# Patient Record
Sex: Female | Born: 1980 | Race: White | Hispanic: No | Smoking: Former smoker
Health system: Southern US, Community
[De-identification: ages and names within clinical notes are randomized; demographics above are authoritative.]

## PROBLEM LIST (undated history)

## (undated) DIAGNOSIS — F32A Depression, unspecified: Secondary | ICD-10-CM

## (undated) DIAGNOSIS — F329 Major depressive disorder, single episode, unspecified: Secondary | ICD-10-CM

## (undated) DIAGNOSIS — F419 Anxiety disorder, unspecified: Secondary | ICD-10-CM

## (undated) DIAGNOSIS — T7840XA Allergy, unspecified, initial encounter: Secondary | ICD-10-CM

## (undated) DIAGNOSIS — A64 Unspecified sexually transmitted disease: Secondary | ICD-10-CM

## (undated) HISTORY — DX: Allergy, unspecified, initial encounter: T78.40XA

## (undated) HISTORY — DX: Depression, unspecified: F32.A

## (undated) HISTORY — DX: Major depressive disorder, single episode, unspecified: F32.9

## (undated) HISTORY — PX: NO PAST SURGERIES: SHX2092

## (undated) HISTORY — DX: Anxiety disorder, unspecified: F41.9

## (undated) HISTORY — PX: INTRAUTERINE DEVICE (IUD) INSERTION: SHX5877

## (undated) HISTORY — DX: Unspecified sexually transmitted disease: A64

---

## 2008-12-19 ENCOUNTER — Encounter: Payer: Self-pay | Admitting: Internal Medicine

## 2008-12-19 ENCOUNTER — Encounter: Admission: RE | Admit: 2008-12-19 | Discharge: 2008-12-19 | Payer: Self-pay | Admitting: Internal Medicine

## 2010-11-02 ENCOUNTER — Encounter: Payer: Self-pay | Admitting: Internal Medicine

## 2010-11-02 ENCOUNTER — Other Ambulatory Visit: Payer: Self-pay | Admitting: Internal Medicine

## 2010-11-02 ENCOUNTER — Other Ambulatory Visit: Payer: BC Managed Care – PPO

## 2010-11-02 ENCOUNTER — Ambulatory Visit (INDEPENDENT_AMBULATORY_CARE_PROVIDER_SITE_OTHER): Payer: BC Managed Care – PPO | Admitting: Internal Medicine

## 2010-11-02 DIAGNOSIS — Z0000 Encounter for general adult medical examination without abnormal findings: Secondary | ICD-10-CM

## 2010-11-02 DIAGNOSIS — F32A Depression, unspecified: Secondary | ICD-10-CM | POA: Insufficient documentation

## 2010-11-02 DIAGNOSIS — J309 Allergic rhinitis, unspecified: Secondary | ICD-10-CM | POA: Insufficient documentation

## 2010-11-02 DIAGNOSIS — F411 Generalized anxiety disorder: Secondary | ICD-10-CM | POA: Insufficient documentation

## 2010-11-02 DIAGNOSIS — K219 Gastro-esophageal reflux disease without esophagitis: Secondary | ICD-10-CM | POA: Insufficient documentation

## 2010-11-02 DIAGNOSIS — F329 Major depressive disorder, single episode, unspecified: Secondary | ICD-10-CM | POA: Insufficient documentation

## 2010-11-02 LAB — CBC WITH DIFFERENTIAL/PLATELET
Basophils Absolute: 0 10*3/uL (ref 0.0–0.1)
Eosinophils Relative: 0.8 % (ref 0.0–5.0)
HCT: 34.1 % — ABNORMAL LOW (ref 36.0–46.0)
Lymphs Abs: 2.5 10*3/uL (ref 0.7–4.0)
MCV: 87.2 fl (ref 78.0–100.0)
Monocytes Absolute: 0.5 10*3/uL (ref 0.1–1.0)
Neutrophils Relative %: 63 % (ref 43.0–77.0)
Platelets: 302 10*3/uL (ref 150.0–400.0)
RDW: 12.7 % (ref 11.5–14.6)
WBC: 8.5 10*3/uL (ref 4.5–10.5)

## 2010-11-02 LAB — HEPATIC FUNCTION PANEL
ALT: 22 U/L (ref 0–35)
Bilirubin, Direct: 0.1 mg/dL (ref 0.0–0.3)
Total Bilirubin: 0.3 mg/dL (ref 0.3–1.2)

## 2010-11-02 LAB — LIPID PANEL
Cholesterol: 185 mg/dL (ref 0–200)
LDL Cholesterol: 111 mg/dL — ABNORMAL HIGH (ref 0–99)
Triglycerides: 138 mg/dL (ref 0.0–149.0)
VLDL: 27.6 mg/dL (ref 0.0–40.0)

## 2010-11-02 LAB — TSH: TSH: 1.88 u[IU]/mL (ref 0.35–5.50)

## 2010-11-02 LAB — BASIC METABOLIC PANEL
BUN: 14 mg/dL (ref 6–23)
Creatinine, Ser: 0.9 mg/dL (ref 0.4–1.2)
GFR: 77.31 mL/min (ref >60.00)
Glucose, Bld: 76 mg/dL (ref 70–99)

## 2010-11-02 LAB — URINALYSIS, ROUTINE W REFLEX MICROSCOPIC
Nitrite: NEGATIVE
Specific Gravity, Urine: 1.02 (ref 1.000–1.030)
Total Protein, Urine: NEGATIVE
pH: 8 (ref 5.0–8.0)

## 2010-11-11 ENCOUNTER — Telehealth: Payer: Self-pay | Admitting: Internal Medicine

## 2010-11-11 NOTE — Assessment & Plan Note (Signed)
Summary: NEW [employer] PT--PKG--#-- STC   Vital Signs:  Patient profile:   30 year old female Height:      68 inches (172.72 cm) Weight:      178.12 pounds (80.96 kg) BMI:     27.18 O2 Sat:      97 % on Room air Temp:     98.1 degrees F (36.72 degrees C) oral Pulse rate:   55 / minute BP sitting:   102 / 62  (left arm) Cuff size:   regular  Vitals Entered By: Orlan Leavens RMA (November 02, 2010 3:03 PM)  O2 Flow:  Room air CC: New patient Is Patient Diabetic? No Pain Assessment Patient in pain? no        Primary Care Provider:  Newt Lukes MD  CC:  New patient.  History of Present Illness: new pt to me and our practice, here to est care  patient is here today for annual physical. Patient feels well and has no complaints.   reviewed chronic med issues -  long hx depression - on meds for same since 30yo (always prozac) inc anxiety symptoms rather than depression - started xanax for same with improvment - also "nervous stomach" - nausea and ache when stressed - no weight changes, no bowel changes would like to resume counseling and ?try alt med tx  considering pregnancy in coming year  Preventive Screening-Counseling & Management  Alcohol-Tobacco     Alcohol drinks/day: <1     Alcohol Counseling: not indicated; use of alcohol is not excessive or problematic     Smoking Status: quit     Tobacco Counseling: to remain off tobacco products  Caffeine-Diet-Exercise     Does Patient Exercise: yes     Type of exercise: dance, yoga, pilates     Exercise Counseling: not indicated; exercise is adequate     Depression Counseling: further diagnostic testing and/or other treatment is indicated  Safety-Violence-Falls     Seat Belt Counseling: not indicated; patient wears seat belts     Firearm Counseling: not indicated; uses recommended firearm safety measures     Violence Counseling: not indicated; no violence risk noted     Fall Risk Counseling: not indicated; no  significant falls noted  Current Medications (verified): 1)  Prozac 20 Mg Caps (Fluoxetine Hcl) .... Take 1 By Mouth Once Daily 2)  Alprazolam 0.25 Mg Tabs (Alprazolam) .... Take 1 By Mouth Once Daily As Needed 3)  Tri-Sprintec 0.18/0.215/0.25 Mg-35 Mcg Tabs (Norgestim-Eth Estrad Triphasic) .... 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 I have reviewed all other systems and they were negative.   Physical Exam  General:  alert, well-developed, well-nourished, and cooperative to examination.    Head:  Normocephalic and atraumatic without obvious abnormalities. No apparent alopecia or balding. Eyes:  vision grossly intact; pupils equal, round and reactive to light.  conjunctiva and lids normal.  Ears:  normal pinnae bilaterally, without erythema, swelling, or tenderness to palpation. TMs clear, without effusion, or cerumen impaction. Hearing grossly normal bilaterally  Mouth:  teeth and gums in good repair; mucous membranes moist, without lesions or ulcers. oropharynx clear without exudate, no erythema.  Neck:  supple, full ROM, no masses, no thyromegaly; no thyroid nodules or tenderness. no JVD or carotid bruits.   Lungs:  normal respiratory effort, no intercostal retractions or use of accessory muscles; normal breath sounds bilaterally - no crackles and no  wheezes.    Heart:  normal rate, regular rhythm, no murmur, and no rub. BLE without edema.  Abdomen:  soft, non-tender, normal bowel sounds, no distention; no masses and no appreciable hepatomegaly or splenomegaly.   Genitalia:  defer Msk:  No deformity or scoliosis noted of thoracic or lumbar spine.   Neurologic:  alert & oriented X3 and cranial nerves II-XII symetrically intact.  strength normal in all extremities, sensation intact to light touch, and gait normal. speech fluent without dysarthria or aphasia; follows commands with good comprehension.  Skin:  no rashes, vesicles, ulcers, or erythema. No nodules or irregularity to palpation.  Psych:  Oriented X3, memory intact for recent and remote, normally interactive, good eye contact, not anxious appearing, min depressed appearing, and not agitated.      Impression & Recommendations:  Problem # 1:  PREVENTIVE HEALTH CARE (ICD-V70.0) Patient has been counseled on age-appropriate routine health concerns for screening and prevention. These are reviewed and up-to-date. Immunizations are up-to-date or declined. Labs ordered and to be reviewed.  Orders: TLB-Lipid Panel (80061-LIPID) TLB-BMP (Basic Metabolic Panel-BMET) (80048-METABOL) TLB-CBC Platelet - w/Differential (85025-CBCD) TLB-Hepatic/Liver Function Pnl (80076-HEPATIC) TLB-TSH (Thyroid Stimulating Hormone) (84443-TSH) TLB-Udip w/ Micro (81001-URINE) Gynecologic Referral (Gyn)  Problem # 2:  ANXIETY STATE, UNSPECIFIED (ICD-300.00) long hx depression - now inc anxiety symptoms  change prozac to sertraline and ok to use as needed xanax as ongoing -  anticipated risk.benefits of med change reviewed and pt understands/agrees rec counseling - pt agrees and will call back with pref providers on same f/u 6-8 weeks on symptoms and meds, sooner if problems Her updated medication list for this problem includes:    Sertraline Hcl 50 Mg Tabs (Sertraline hcl) .Marland Kitchen... 1 by mouth once daily     Alprazolam 0.25 Mg Tabs (Alprazolam) .Marland Kitchen... Take 1 by mouth once daily as needed  Complete Medication List: 1)  Sertraline Hcl 50 Mg Tabs (Sertraline hcl) .Marland Kitchen.. 1 by mouth once daily 2)  Alprazolam 0.25 Mg Tabs (Alprazolam) .... Take 1 by mouth once daily as needed 3)  Tri-sprintec 0.18/0.215/0.25 Mg-35 Mcg Tabs (Norgestim-eth estrad triphasic) .... Take 1 by mouth once daily 4)  Prilosec Otc 20 Mg Tbec (Omeprazole magnesium) .... As needed  Patient Instructions: 1)  it was good to see you today. 2)  medical history reviewed today - exam looks good 3)  test(s) ordered today - your results will be posted on the phone tree for review in 48-72 hours from the time of test completion; call [phone] and enter your 9 digit MRN (listed above on this page, just below your name); if any changes need to be made or there are abnormal results, you will be contacted directly. 4)  stop prozac and start generic zoloft (sertraline) for anxiety symptoms - ok to continue xanax as needed  - your prescription has been electronically submitted to your pharmacy. Please take as directed. Contact our office if you believe you're having problems with the medication(s).  5)  let us know who you would like to see for counseling to help with anxiety management amd we will make referral 6)  we'll make referral to gynecology. Our office will contact you regarding this appointment once made.  7)  Please schedule a follow-up appointment in 6-8 weeks to review anxiety and medications, call sooner if problems.  Prescriptions: SERTRALINE HCL 50 MG TABS (SERTRALINE HCL) 1 by mouth once daily  #30 x 3   Entered and Authorized by:   Newt Lukes MD   Signed by:   Newt Lukes MD on 11/02/2010   Method used:   Electronically to        [facility]       [address]       [place], [place]  [address]       Ph: [phone]       Fax: [phone]   RxID:    [id number]    Orders Added: 1)  TLB-Lipid Panel [80061-LIPID] 2)  TLB-BMP (Basic Metabolic Panel-BMET) [80048-METABOL] 3)  TLB-CBC Platelet - w/Differential [85025-CBCD] 4)  TLB-Hepatic/Liver Function Pnl [80076-HEPATIC] 5)  TLB-TSH (Thyroid Stimulating Hormone) [84443-TSH] 6)  TLB-Udip w/ Micro [81001-URINE] 7)  Gynecologic Referral [Gyn] 8)  New Patient 18-39 years [99385]   Immunization History:  Tetanus/Td Immunization History:    Tetanus/Td:  historical (09/26/2005)   Immunization History:  Tetanus/Td Immunization History:    Tetanus/Td:  Historical (09/26/2005)

## 2010-11-17 NOTE — Progress Notes (Signed)
Summary: REQ FOR REFERRAL  Phone Note Call from Patient   Summary of Call: Patient is requesting referral to psych MD - Dr Perrin Maltese @ [phone] Initial call taken by: Lamar Sprinkles, CMA,  November 11, 2010 5:33 PM  Follow-up for Phone Call        order as requested - done Follow-up by: Newt Lukes MD,  November 12, 2010 8:17 AM  Additional Follow-up for Phone Call Additional follow up Details #1::        pt advised and will expect a call from [facility] with appt info Additional Follow-up by: Margaret Pyle, CMA,  November 12, 2010 8:44 AM

## 2010-12-15 ENCOUNTER — Encounter: Payer: Self-pay | Admitting: Internal Medicine

## 2010-12-15 ENCOUNTER — Ambulatory Visit (INDEPENDENT_AMBULATORY_CARE_PROVIDER_SITE_OTHER): Payer: BC Managed Care – PPO | Admitting: Internal Medicine

## 2010-12-15 VITALS — BP 124/78 | HR 42 | Temp 97.9°F | Ht 68.0 in | Wt 178.4 lb

## 2010-12-15 DIAGNOSIS — K219 Gastro-esophageal reflux disease without esophagitis: Secondary | ICD-10-CM

## 2010-12-15 DIAGNOSIS — F411 Generalized anxiety disorder: Secondary | ICD-10-CM

## 2010-12-15 MED ORDER — SERTRALINE HCL 100 MG PO TABS: 100.0000 mg | ORAL_TABLET | Freq: Every day | ORAL | Status: DC

## 2010-12-15 NOTE — Assessment & Plan Note (Signed)
Started sertraline 09/2010 - improved but not at goal - titrate now - erx done Cont counseling as ongoing

## 2010-12-15 NOTE — Assessment & Plan Note (Signed)
Improved with otc ppi x 30d - ok to cont same or use prn - diet control also discussed

## 2010-12-15 NOTE — Patient Instructions (Signed)
Good to see you today. Increase dose sertraline today as discussed - Your prescription(s) have been submitted to your pharmacy. Please take as directed and contact our office if you believe you are having problem(s) with the medication(s). Continue counseling as ongoing See you in 3-6 months to review symptoms and medications

## 2010-12-15 NOTE — Progress Notes (Signed)
Subjective:     Erica [name] is a 30 y.o. female who presents for follow up of anxiety disorder. Current symptoms: none. She denies current suicidal and homicidal ideation. She complains of the following side effects from the treatment: none.  The following portions of the patient's history were reviewed and updated as appropriate: allergies, current medications, past medical history and past social history.    ROS: no chest pain, no headache Objective:    BP 124/78  Pulse 42  Temp(Src) 97.9 F (36.6 C) (Oral)  Ht 5\' 8"  (1.727 m)  Wt 178 lb 6.4 oz (80.922 kg)  BMI 27.13 kg/m2  General:  alert, cooperative, appears stated age and no distress  Affect/Behavior:  good insight, normal reasoning and normal speech pattern and content  - no anxiety symptoms evident      Assessment:    Anxiety Disorder - improving    Plan:  See problem list   Medications: Zoloft. Reviewed concept of anxiety as biochemical imbalance of neurotransmitters and rationale for treatment. Spent 25 minutes (>50% of visit) discussing the risks of anxiety disorder, the  pathophysiology, etiology, risks, and principles of treatment.

## 2011-01-31 ENCOUNTER — Other Ambulatory Visit: Payer: Self-pay | Admitting: *Deleted

## 2011-01-31 MED ORDER — ALPRAZOLAM 0.25 MG PO TABS: 0.2500 mg | ORAL_TABLET | Freq: Every day | ORAL | Status: DC | PRN

## 2011-01-31 NOTE — Telephone Encounter (Signed)
Faxed script [facility] to [facility] 01/31/11@4 :33pm/LMB

## 2011-03-15 ENCOUNTER — Ambulatory Visit: Payer: BC Managed Care – PPO | Admitting: Internal Medicine

## 2011-03-25 ENCOUNTER — Ambulatory Visit (INDEPENDENT_AMBULATORY_CARE_PROVIDER_SITE_OTHER): Payer: BC Managed Care – PPO | Admitting: Internal Medicine

## 2011-03-25 ENCOUNTER — Encounter: Payer: Self-pay | Admitting: Internal Medicine

## 2011-03-25 DIAGNOSIS — F411 Generalized anxiety disorder: Secondary | ICD-10-CM

## 2011-03-25 DIAGNOSIS — K219 Gastro-esophageal reflux disease without esophagitis: Secondary | ICD-10-CM

## 2011-03-25 MED ORDER — ALPRAZOLAM 0.25 MG PO TABS: 0.2500 mg | ORAL_TABLET | Freq: Every day | ORAL | Status: DC | PRN

## 2011-03-25 NOTE — Assessment & Plan Note (Signed)
Improved with otc ppi ok to continue same  Probitoics, "beano" and diet control also discussed

## 2011-03-25 NOTE — Patient Instructions (Signed)
Good to see you today. Continue current dose sertraline and use alprazolam as needed - also continue counseling as ongoing Medications reviewed, no changes at this time. Refill on medication(s) as discussed today. See you in 10/2011 for physical and labs - will also review symptoms and medications

## 2011-03-25 NOTE — Progress Notes (Signed)
Subjective:     Erica [name] is a 30 y.o. female who presents for follow up of anxiety disorder. Current symptoms: none. She denies current suicidal and homicidal ideation. She complains of the following side effects from the treatment: none. SSRI dose increase last visit - improved; ongoing counseling - also with spouse  The following portions of the patient's history were reviewed and updated as appropriate: allergies, current medications, past medical history and past social history.    ROS: no chest pain, no headache Objective:    BP 128/84  Pulse 46  Temp(Src) 98.1 F (36.7 C) (Oral)  Ht 5\' 8"  (1.727 m)  Wt 180 lb 6.4 oz (81.829 kg)  BMI 27.43 kg/m2  SpO2 96%  General:  alert, cooperative, appears stated age and no distress  Affect/Behavior:  good insight, normal reasoning and normal speech pattern and content  - no anxiety symptoms evident     Lab Results  Component Value Date   WBC 8.5 11/02/2010   HGB 11.6* 11/02/2010   HCT 34.1* 11/02/2010   PLT 302.0 11/02/2010   CHOL 185 11/02/2010   TRIG 138.0 11/02/2010   HDL 46.60 11/02/2010   ALT 22 11/02/2010   AST 29 11/02/2010   NA 139 11/02/2010   K 4.3 11/02/2010   CL 104 11/02/2010   CREATININE 0.9 11/02/2010   BUN 14 11/02/2010   CO2 28 11/02/2010   TSH 1.88 11/02/2010    Assessment:    Anxiety Disorder - improving    Plan:  See problem list   Medications: Zoloft. Reviewed concept of anxiety as biochemical imbalance of neurotransmitters and rationale for treatment. Spent 25 minutes (>50% of visit) discussing the risks of anxiety disorder, the  pathophysiology, etiology, risks, and principles of treatment.

## 2011-03-25 NOTE — Assessment & Plan Note (Signed)
Started sertraline 09/2010, dose increase 12/2010 - improved -  The current medical regimen is effective;  continue present plan and medications. Cont counseling as ongoing

## 2011-06-16 ENCOUNTER — Other Ambulatory Visit: Payer: Self-pay | Admitting: *Deleted

## 2011-06-16 DIAGNOSIS — F411 Generalized anxiety disorder: Secondary | ICD-10-CM

## 2011-06-16 MED ORDER — ALPRAZOLAM 0.25 MG PO TABS: 0.2500 mg | ORAL_TABLET | Freq: Every day | ORAL | Status: DC | PRN

## 2011-06-16 NOTE — Telephone Encounter (Signed)
Done hardcopy to dahlia/LIM B  

## 2011-06-16 NOTE — Telephone Encounter (Signed)
MD is out of office. Is this ok to refill? 

## 2011-06-17 NOTE — Telephone Encounter (Signed)
Rx faxed to pharmacy  

## 2011-07-18 ENCOUNTER — Other Ambulatory Visit: Payer: Self-pay | Admitting: Internal Medicine

## 2011-09-27 NOTE — L&D Delivery Note (Signed)
Delivery Note At 9:36 AM a viable female was delivered via Vaginal, Spontaneous Delivery (Presentation: Left Occiput Anterior).  APGAR: 8, 9; weight 7 lb 1.8 oz (3226 g). Water Birth. Baby skin to skin on mother 's chest just at water level to maintain baby's temperature. Cord cut by FOB after ceased pulsing. Mother assisted back to bed for placenta delivery.   Placenta status: Intact, Spontaneous.  Cord: 3 vessels with the following complications: None.  Cord pH: not completed. Uncomplicated vaginal  water birth.  Anesthesia: Local  Episiotomy: None Lacerations: 2nd degree;Perineal; Rt Labial minora tear. In/out catheter - urethra patent. Suture Repair: 3.0 vicryl, 4.0 vicryl Est. Blood Loss (mL): 300 Baby has breast feed s/p delivery, skin to skin maintained with both mother and baby.  Mom to postpartum.  Baby to nursery-stable.  Earl Gala, CNM 08/29/2012, 11:39 AM

## 2011-10-28 ENCOUNTER — Ambulatory Visit: Payer: BC Managed Care – PPO | Admitting: Internal Medicine

## 2011-10-31 ENCOUNTER — Encounter: Payer: Self-pay | Admitting: Internal Medicine

## 2011-10-31 ENCOUNTER — Other Ambulatory Visit (INDEPENDENT_AMBULATORY_CARE_PROVIDER_SITE_OTHER): Payer: BC Managed Care – PPO

## 2011-10-31 ENCOUNTER — Ambulatory Visit (INDEPENDENT_AMBULATORY_CARE_PROVIDER_SITE_OTHER): Payer: BC Managed Care – PPO | Admitting: Internal Medicine

## 2011-10-31 VITALS — BP 110/72 | HR 59 | Temp 99.0°F | Ht 68.0 in | Wt 185.4 lb

## 2011-10-31 DIAGNOSIS — Z0000 Encounter for general adult medical examination without abnormal findings: Secondary | ICD-10-CM

## 2011-10-31 LAB — CBC WITH DIFFERENTIAL/PLATELET
Basophils Absolute: 0 10*3/uL (ref 0.0–0.1)
Eosinophils Absolute: 0.1 10*3/uL (ref 0.0–0.7)
Hemoglobin: 12.6 g/dL (ref 12.0–15.0)
Lymphocytes Relative: 39.1 % (ref 12.0–46.0)
Lymphs Abs: 2.2 10*3/uL (ref 0.7–4.0)
MCHC: 33.6 g/dL (ref 30.0–36.0)
Neutro Abs: 3 10*3/uL (ref 1.4–7.7)
Platelets: 261 10*3/uL (ref 150.0–400.0)
RDW: 13.4 % (ref 11.5–14.6)

## 2011-10-31 LAB — LIPID PANEL
Total CHOL/HDL Ratio: 4
Triglycerides: 82 mg/dL (ref 0.0–149.0)

## 2011-10-31 LAB — BASIC METABOLIC PANEL
BUN: 15 mg/dL (ref 6–23)
CO2: 29 mEq/L (ref 19–32)
Calcium: 9 mg/dL (ref 8.4–10.5)
Glucose, Bld: 84 mg/dL (ref 70–99)
Sodium: 137 mEq/L (ref 135–145)

## 2011-10-31 LAB — URINALYSIS, ROUTINE W REFLEX MICROSCOPIC
Ketones, ur: NEGATIVE
Urine Glucose: NEGATIVE
Urobilinogen, UA: 0.2 (ref 0.0–1.0)

## 2011-10-31 LAB — HEPATIC FUNCTION PANEL
ALT: 22 U/L (ref 0–35)
AST: 24 U/L (ref 0–37)
Alkaline Phosphatase: 68 U/L (ref 39–117)
Bilirubin, Direct: 0.1 mg/dL (ref 0.0–0.3)
Total Bilirubin: 0.6 mg/dL (ref 0.3–1.2)

## 2011-10-31 LAB — LDL CHOLESTEROL, DIRECT: Direct LDL: 150.5 mg/dL

## 2011-10-31 NOTE — Patient Instructions (Signed)
It was good to see you today. Test(s) ordered today. Your results will be called to you after review (48-72hours after test completion). If any changes need to be made, you will be notified at that time. Medications reviewed, no changes at this time. Please schedule followup annually for physical and labs, call sooner if problems.

## 2011-10-31 NOTE — Progress Notes (Signed)
Subjective:    Patient ID: Erica [name], female    DOB: [birth year], 31 y.o.   MRN: [id number]  HPI patient is here today for annual physical. Patient feels well and has no complaints.  Planning pregnancy this year. Off birth control since fall 2012.  Past Medical History  Diagnosis Date  . Anxiety   . Depression   . Allergy    Family History  Problem Relation Age of Onset  . Alcohol abuse Mother   . Alcohol abuse Other     Whole family  . Breast cancer Other   . Lung cancer Other    History  Substance Use Topics  . Smoking status: Former Games developer  . Smokeless tobacco: Not on file  . Alcohol Use: Yes     Social    Review of Systems Constitutional: Negative for fever or weight change.  Respiratory: Negative for cough and shortness of breath.   Cardiovascular: Negative for chest pain or palpitations.  Gastrointestinal: Negative for abdominal pain, no bowel changes.  Musculoskeletal: Negative for gait problem or joint swelling.  Skin: Negative for rash.  Neurological: Negative for dizziness or headache.  No other specific complaints in a complete review of systems (except as listed in HPI above).      Objective:   Physical Exam  BP 110/72  Pulse 59  Temp(Src) 99 F (37.2 C) (Oral)  Ht 5\' 8"  (1.727 m)  Wt 185 lb 6.4 oz (84.097 kg)  BMI 28.19 kg/m2  SpO2 97% Wt Readings from Last 3 Encounters:  10/31/11 185 lb 6.4 oz (84.097 kg)  03/25/11 180 lb 6.4 oz (81.829 kg)  12/15/10 178 lb 6.4 oz (80.922 kg)   Constitutional: She appears well-developed and well-nourished. No distress.  HENT: Head: Normocephalic and atraumatic. Ears: B TMs ok, no erythema or effusion; Nose: Nose normal.  Mouth/Throat: Oropharynx is clear and moist. No oropharyngeal exudate.  Eyes: Conjunctivae and EOM are normal. Pupils are equal, round, and reactive to light. No scleral icterus.  Neck: Normal range of motion. Neck supple. No JVD present. No thyromegaly present.  Cardiovascular: Normal  rate, regular rhythm and normal heart sounds.  No murmur heard. No BLE edema. Pulmonary/Chest: Effort normal and breath sounds normal. No respiratory distress. She has no wheezes.  Abdominal: Soft. Bowel sounds are normal. She exhibits no distension. There is no tenderness. no masses GU: defer to gyn (appt 10/2011 sched) Musculoskeletal: Normal range of motion, no joint effusions. No gross deformities Neurological: She is alert and oriented to person, place, and time. No cranial nerve deficit. Coordination normal.  Skin: Skin is warm and dry. No rash noted. No erythema.  Psychiatric: She has a normal mood and affect. Her behavior is normal. Judgment and thought content normal.   Lab Results  Component Value Date   WBC 8.5 11/02/2010   HGB 11.6* 11/02/2010   HCT 34.1* 11/02/2010   PLT 302.0 11/02/2010   GLUCOSE 76 11/02/2010   CHOL 185 11/02/2010   TRIG 138.0 11/02/2010   HDL 46.60 11/02/2010   LDLCALC 111* 11/02/2010   ALT 22 11/02/2010   AST 29 11/02/2010   NA 139 11/02/2010   K 4.3 11/02/2010   CL 104 11/02/2010   CREATININE 0.9 11/02/2010   BUN 14 11/02/2010   CO2 28 11/02/2010   TSH 1.88 11/02/2010         Assessment & Plan:  CPX - v70.0 - Patient has been counseled on age-appropriate routine health concerns for screening and prevention. These are reviewed  and up-to-date. Immunizations are up-to-date or declined. Labs ordered and will be reviewed.

## 2011-11-17 ENCOUNTER — Other Ambulatory Visit: Payer: Self-pay | Admitting: *Deleted

## 2011-11-17 DIAGNOSIS — F411 Generalized anxiety disorder: Secondary | ICD-10-CM

## 2011-11-17 MED ORDER — ALPRAZOLAM 0.25 MG PO TABS: 0.2500 mg | ORAL_TABLET | Freq: Every day | ORAL | Status: DC | PRN

## 2011-11-17 NOTE — Telephone Encounter (Signed)
[facility]... 11/17/11@ 11:42am/LMB

## 2011-11-17 NOTE — Telephone Encounter (Signed)
Faxed script back to 

## 2012-01-09 ENCOUNTER — Ambulatory Visit (INDEPENDENT_AMBULATORY_CARE_PROVIDER_SITE_OTHER): Payer: BC Managed Care – PPO | Admitting: Internal Medicine

## 2012-01-09 ENCOUNTER — Encounter: Payer: Self-pay | Admitting: Internal Medicine

## 2012-01-09 VITALS — BP 110/70 | HR 54 | Temp 97.7°F | Ht 68.0 in | Wt 177.8 lb

## 2012-01-09 DIAGNOSIS — Z3490 Encounter for supervision of normal pregnancy, unspecified, unspecified trimester: Secondary | ICD-10-CM

## 2012-01-09 DIAGNOSIS — Z331 Pregnant state, incidental: Secondary | ICD-10-CM

## 2012-01-09 DIAGNOSIS — K219 Gastro-esophageal reflux disease without esophagitis: Secondary | ICD-10-CM

## 2012-01-09 DIAGNOSIS — L709 Acne, unspecified: Secondary | ICD-10-CM

## 2012-01-09 DIAGNOSIS — L708 Other acne: Secondary | ICD-10-CM

## 2012-01-09 DIAGNOSIS — F411 Generalized anxiety disorder: Secondary | ICD-10-CM

## 2012-01-09 MED ORDER — RANITIDINE HCL 75 MG PO TABS: 75.0000 mg | ORAL_TABLET | Freq: Two times a day (BID) | ORAL | Status: DC

## 2012-01-09 NOTE — Patient Instructions (Signed)
It was good to see you today. Medications reviewed, start zantac for reflux - Your prescription(s) have been submitted to your pharmacy. Please take as directed and contact our office if you believe you are having problem(s) with the medication(s).

## 2012-01-09 NOTE — Progress Notes (Signed)
  Subjective:    Patient ID: Erica [name], female    DOB: 11/09/1980, 31 y.o.   MRN: [id number]  HPI  Here for follow up -  [redacted] weeks pregnant - all meds stopped by ob 2 weeks ago No emotional flares - will remain off meds at this time and monitor for recurrence with spouse and ob  Concerned about increase reflux off PPI Also concerned about skin rash on chest  Past Medical History  Diagnosis Date  . Anxiety   . Depression   . Allergy      Review of Systems  Constitutional: Negative for fatigue and unexpected weight change.  Respiratory: Negative for cough and shortness of breath.        Objective:   Physical Exam  BP 110/70  Pulse 54  Temp(Src) 97.7 F (36.5 C) (Oral)  Ht 5\' 8"  (1.727 m)  Wt 177 lb 12.8 oz (80.65 kg)  BMI 27.03 kg/m2  SpO2 99% Constitutional: She appears well-developed and well-nourished. No distress.  Neck: Normal range of motion. Neck supple. No JVD present. No thyromegaly present.  Cardiovascular: Normal rate, regular rhythm and normal heart sounds.  No murmur heard. No BLE edema. Pulmonary/Chest: Effort normal and breath sounds normal. No respiratory distress. She has no wheezes.  Skin : Acne rash on anterior chest and upper shoulders -  Psychiatric: She has a mildly depressed mood and affect, moments of tearfulness during history and exam. Her behavior is normal. Judgment and thought content normal.       Assessment & Plan:   Normal intrauterine pregnancy, 8 weeks - follow with OB/GYN for same. Medication changes reviewed. Reassurance and support provided. Patient will continue to follow with her OB on forward and call if recurrent problems or symptoms with anxiety, reflux or other medical issues  Acne - reassurance provided. Non-medical treatment recommended

## 2012-01-09 NOTE — Assessment & Plan Note (Signed)
Improved with otc ppi - will change to H2B for pregnancy safety Probitoics, "beano" and diet control also discussed

## 2012-01-09 NOTE — Assessment & Plan Note (Signed)
Started sertraline 09/2010, dose increase 12/2010 - improved -  Off meds 12/2011 with new pregnancy Encouraged to resume counseling and call if problems arise

## 2012-01-24 LAB — OB RESULTS CONSOLE GC/CHLAMYDIA: Gonorrhea: NEGATIVE

## 2012-01-24 LAB — OB RESULTS CONSOLE ABO/RH

## 2012-01-24 LAB — OB RESULTS CONSOLE HIV ANTIBODY (ROUTINE TESTING): HIV: NONREACTIVE

## 2012-01-24 LAB — OB RESULTS CONSOLE RUBELLA ANTIBODY, IGM: Rubella: IMMUNE

## 2012-08-02 LAB — OB RESULTS CONSOLE GBS: GBS: NEGATIVE

## 2012-08-29 ENCOUNTER — Encounter (HOSPITAL_COMMUNITY): Payer: Self-pay | Admitting: *Deleted

## 2012-08-29 ENCOUNTER — Inpatient Hospital Stay (HOSPITAL_COMMUNITY)
Admission: AD | Admit: 2012-08-29 | Discharge: 2012-08-31 | DRG: 373 | Disposition: A | Discharge status: Home / Self Care | Payer: BC Managed Care – PPO | Source: Ambulatory Visit | Attending: Obstetrics and Gynecology | Admitting: Obstetrics and Gynecology

## 2012-08-29 DIAGNOSIS — D649 Anemia, unspecified: Secondary | ICD-10-CM | POA: Diagnosis not present

## 2012-08-29 DIAGNOSIS — O9902 Anemia complicating childbirth: Secondary | ICD-10-CM

## 2012-08-29 DIAGNOSIS — O9903 Anemia complicating the puerperium: Secondary | ICD-10-CM | POA: Diagnosis not present

## 2012-08-29 LAB — COMPREHENSIVE METABOLIC PANEL
BUN: 9 mg/dL (ref 6–23)
CO2: 21 mEq/L (ref 19–32)
Calcium: 9.2 mg/dL (ref 8.4–10.5)
Creatinine, Ser: 0.65 mg/dL (ref 0.50–1.10)
GFR calc Af Amer: >90 mL/min (ref >90)
GFR calc non Af Amer: >90 mL/min (ref >90)
Glucose, Bld: 97 mg/dL (ref 70–99)
Sodium: 132 mEq/L — ABNORMAL LOW (ref 135–145)
Total Protein: 7.1 g/dL (ref 6.0–8.3)

## 2012-08-29 LAB — CBC
HCT: 35.3 % — ABNORMAL LOW (ref 36.0–46.0)
Hemoglobin: 11.9 g/dL — ABNORMAL LOW (ref 12.0–15.0)
MCHC: 33.7 g/dL (ref 30.0–36.0)
RBC: 4.12 MIL/uL (ref 3.87–5.11)
WBC: 19.2 10*3/uL — ABNORMAL HIGH (ref 4.0–10.5)

## 2012-08-29 LAB — RPR: RPR Ser Ql: NONREACTIVE

## 2012-08-29 MED ORDER — ONDANSETRON HCL 4 MG/2ML IJ SOLN: 4.0000 mg | INTRAMUSCULAR | Status: DC | PRN

## 2012-08-29 MED ORDER — IBUPROFEN 600 MG PO TABS
600.0000 mg | ORAL_TABLET | Freq: Four times a day (QID) | ORAL | Status: DC
  Administered 2012-08-29 – 2012-08-31 (×7): 600 mg via ORAL
  Filled 2012-08-29 (×7): qty 1

## 2012-08-29 MED ORDER — OXYTOCIN 10 UNIT/ML IJ SOLN
INTRAMUSCULAR | Status: AC
  Administered 2012-08-29: 10 [IU] via INTRAMUSCULAR
  Filled 2012-08-29: qty 2

## 2012-08-29 MED ORDER — LANOLIN HYDROUS EX OINT: TOPICAL_OINTMENT | CUTANEOUS | Status: DC | PRN

## 2012-08-29 MED ORDER — OXYCODONE-ACETAMINOPHEN 5-325 MG PO TABS: 1.0000 | ORAL_TABLET | ORAL | Status: DC | PRN

## 2012-08-29 MED ORDER — WITCH HAZEL-GLYCERIN EX PADS: 1.0000 "application " | MEDICATED_PAD | CUTANEOUS | Status: DC | PRN

## 2012-08-29 MED ORDER — LACTATED RINGERS IV SOLN: INTRAVENOUS | Status: DC

## 2012-08-29 MED ORDER — BENZOCAINE-MENTHOL 20-0.5 % EX AERO
1.0000 "application " | INHALATION_SPRAY | CUTANEOUS | Status: DC | PRN
  Administered 2012-08-29 – 2012-08-30 (×2): 1 via TOPICAL
  Filled 2012-08-29 (×2): qty 56

## 2012-08-29 MED ORDER — LIDOCAINE HCL (PF) 1 % IJ SOLN
30.0000 mL | INTRAMUSCULAR | Status: DC | PRN
  Administered 2012-08-29: 30 mL via SUBCUTANEOUS
  Filled 2012-08-29: qty 30

## 2012-08-29 MED ORDER — ONDANSETRON HCL 4 MG/2ML IJ SOLN: 4.0000 mg | Freq: Four times a day (QID) | INTRAMUSCULAR | Status: DC | PRN

## 2012-08-29 MED ORDER — ZOLPIDEM TARTRATE 5 MG PO TABS: 5.0000 mg | ORAL_TABLET | Freq: Every evening | ORAL | Status: DC | PRN

## 2012-08-29 MED ORDER — DIBUCAINE 1 % RE OINT: 1.0000 "application " | TOPICAL_OINTMENT | RECTAL | Status: DC | PRN

## 2012-08-29 MED ORDER — CITRIC ACID-SODIUM CITRATE 334-500 MG/5ML PO SOLN: 30.0000 mL | ORAL | Status: DC | PRN

## 2012-08-29 MED ORDER — ACETAMINOPHEN 325 MG PO TABS: 650.0000 mg | ORAL_TABLET | ORAL | Status: DC | PRN

## 2012-08-29 MED ORDER — OXYTOCIN BOLUS FROM INFUSION: 500.0000 mL | INTRAVENOUS | Status: DC

## 2012-08-29 MED ORDER — IBUPROFEN 600 MG PO TABS: 600.0000 mg | ORAL_TABLET | Freq: Four times a day (QID) | ORAL | Status: DC | PRN

## 2012-08-29 MED ORDER — OXYTOCIN 40 UNITS IN LACTATED RINGERS INFUSION - SIMPLE MED: 62.5000 mL/h | INTRAVENOUS | Status: DC

## 2012-08-29 MED ORDER — ONDANSETRON HCL 4 MG PO TABS: 4.0000 mg | ORAL_TABLET | ORAL | Status: DC | PRN

## 2012-08-29 MED ORDER — PRENATAL MULTIVITAMIN CH
1.0000 | ORAL_TABLET | Freq: Every day | ORAL | Status: DC
  Administered 2012-08-30 – 2012-08-31 (×2): 1 via ORAL
  Filled 2012-08-29 (×2): qty 1

## 2012-08-29 MED ORDER — SIMETHICONE 80 MG PO CHEW: 80.0000 mg | CHEWABLE_TABLET | ORAL | Status: DC | PRN

## 2012-08-29 MED ORDER — DIPHENHYDRAMINE HCL 25 MG PO CAPS: 25.0000 mg | ORAL_CAPSULE | Freq: Four times a day (QID) | ORAL | Status: DC | PRN

## 2012-08-29 MED ORDER — SENNOSIDES-DOCUSATE SODIUM 8.6-50 MG PO TABS
2.0000 | ORAL_TABLET | Freq: Every day | ORAL | Status: DC
  Administered 2012-08-29 – 2012-08-30 (×2): 2 via ORAL

## 2012-08-29 MED ORDER — LACTATED RINGERS IV SOLN: 500.0000 mL | INTRAVENOUS | Status: DC | PRN

## 2012-08-29 MED ORDER — TETANUS-DIPHTH-ACELL PERTUSSIS 5-2.5-18.5 LF-MCG/0.5 IM SUSP: 0.5000 mL | Freq: Once | INTRAMUSCULAR | Status: DC

## 2012-08-29 NOTE — MAU Note (Signed)
Pt reports painful UC;'s

## 2012-08-29 NOTE — MAU Note (Signed)
May hold IV start @ this time per order from D. Connye Burkitt CNM

## 2012-08-29 NOTE — Progress Notes (Signed)
Breathing through contractions, some pressure with contractions O VSS      fhts category 1, baseline 145 bpm      abd soft between uc      Contractions 1: 2 - 3 mins, strong       Vag 8/90%/0 SROM blood tinged, A Progressing quickly in labor process. - Still desires water birth . Tub being set up. P Continue care. Condition stable and coping well.  Earl Gala, CNM.

## 2012-08-29 NOTE — H&P (Signed)
Erica [name] is a 31 y.o. female, G1P000 at [redacted]w[redacted]d, had been evaluated in the office yesterday and was Cx 3/80/-2, intact. Started to have strong contractions at 05.00hrs this morning and eventually called answering service between 5.30 - 5.40 hrs. Advised by service to come to MAU for evaluation. Firsrt notification of patient in labor from MAU at 6.44 hrs. Advised staff to check and return call after SVE. SVE patient 5/80%/-1 intact at 6.50 hrs. Advised to admit to birthing suites and to prep for water birth as desired by patient.    Patient Active Problem List  Diagnosis  . ANXIETY STATE, UNSPECIFIED  . DEPRESSION  . ALLERGIC RHINITIS  . GERD    History of present pregnancy: Patient entered care at 10 weeks.   EDC of 08/21/12 was established by LMP.   Anatomy scan:  18 weeks, with normal findings and a low lying  Placenta was re scanned at 30 weeks and no previa noted    Significant prenatal events: had been on Wellbutrin for depression early in pregnancy and stopped at 25 weeks- has done very well off medications and coping well at present  Last evaluation:  08/28/12 office and in MAU this am for labor check 08/29/12  Had been taking herbal labor medication Blue Cohosh and red Raspberry leaf tea in last 4 weeks of pregnancy with effect.  OB History    Grav Para Term Preterm Abortions TAB SAB Ect Mult Living   1              Past Medical History  Diagnosis Date  . Anxiety   . Depression   . Allergy    Past Surgical History  Procedure Date  . No past surgeries    Family History: family history includes Alcohol abuse in her mother and other; Breast cancer in her other; and Lung cancer in her other. Social History:  reports that she has quit smoking. She does not have any smokeless tobacco history on file. She reports that she drinks alcohol. She reports that she uses illicit drugs (Marijuana) about once per week.   Prenatal Transfer Tool  Maternal Diabetes: No Genetic  Screening: Normal Maternal Ultrasounds/Referrals: Normal Fetal Ultrasounds or other Referrals:  None Maternal Substance Abuse:  No Significant Maternal Medications:  None Significant Maternal Lab Results: None    ROS:   No Known Allergies   Dilation: 5 Effacement (%): 70 Station: -2 Exam by:: B Mosca RN Blood pressure 143/88, pulse 69, temperature 99 F (37.2 C), temperature source Oral, height 5\' 9"  (1.753 m), weight 99.338 kg (219 lb).  Chest clear Heart RRR without murmur Abd gravid, NT, FH  To dates Pelvic: adequate Ext: normal  FHR: 145 bpm UCs: 1: 3 mins  Prenatal labs: ABO, Rh:  A Pos+ Antibody:  neg Rubella:   immune RPR:   NR HBsAg:   Neg HIV:   Neg GBS:  Neg Sickle cell/Hgb electrophoresis:  norm Pap:  norm GC:  neg Chlamydia:  neg Genetic screenings:  norm Glucola:  norm    Assessment/Plan: IUP at [redacted]w[redacted]d active spontaneous labor, GBS neg,  Singleton, Vx presentation, good PNC  Plan: Direct admit to birthing Suites for labor management - Desires water birth. Prep for Water birth. EFM continuous until good EFM tracing established At present Baseline 145 bpm - Cat 1 CTX 1: 3 mins.   Milka Windholz ,CNM. 08/29/2012, 8:13 AM

## 2012-08-29 NOTE — Progress Notes (Signed)
Feeling pressure. Patient transferred to tub at 9.11hrs O VSS      fhts category 1, baseline 145 bpm      abd soft between uc      Contractions 1: 2-       SVE: 10/100/+1 at 9.22hrs  A: Fully dilated and strong urge to push. Pushing with each contraction. P  Expectant  NSVD  Water birth  Earl Gala, [place].

## 2012-08-30 DIAGNOSIS — O9902 Anemia complicating childbirth: Secondary | ICD-10-CM | POA: Diagnosis not present

## 2012-08-30 LAB — CBC
HCT: 29.2 % — ABNORMAL LOW (ref 36.0–46.0)
MCHC: 33.2 g/dL (ref 30.0–36.0)
MCV: 85.9 fL (ref 78.0–100.0)
Platelets: 244 10*3/uL (ref 150–400)
RDW: 13.6 % (ref 11.5–15.5)
WBC: 19.2 10*3/uL — ABNORMAL HIGH (ref 4.0–10.5)

## 2012-08-30 MED ORDER — POLYSACCHARIDE IRON COMPLEX 150 MG PO CAPS
150.0000 mg | ORAL_CAPSULE | Freq: Every day | ORAL | Status: DC
  Administered 2012-08-30: 150 mg via ORAL
  Filled 2012-08-30 (×2): qty 1

## 2012-08-30 NOTE — Progress Notes (Signed)
Post Partum Day 1 NSVD without complication Viable, female  Subjective: no complaints, up ad lib without syncope, voiding, tolerating PO, + flatus, +BM  Pain well controlled with po meds, taking motrin and percocet  BF: breastfeeding on demand. Mood stable, bonding well     Objective: Blood pressure 130/80, pulse 86, temperature 97.8 F (36.6 C), temperature source Oral, resp. rate 18, height 5\' 9"  (1.753 m), weight 99.338 kg (219 lb), SpO2 98.00%, unknown if currently breastfeeding.  Physical Exam:  General: alert, cooperative and no distress Breasts: nipples slightly tender - advised to cover nipple with colostrum to seal nipple skin Lungs: CTAB Heart: RRR Lochia: appropriate, mod, rubra Uterine Fundus: firm, -2/u Perineum: slightly tender with voiding - advised to use peri bottle while voiding. Advised to continue using ice packs today. Healing well. DVT Evaluation: No evidence of DVT seen on physical exam. Negative Homan's sign. No cords or calf tenderness. No significant calf/ankle edema.   Basename 08/30/12 0530 08/29/12 0720  HGB 9.7* 11.9*  HCT 29.2* 35.3*   Anemia of pregnancy. To commence on Niferex 150 mg po daily  Assessment/Plan: Plan for discharge tomorrow       LOS: 1 day   Gerren Hoffmeier 08/30/2012, 10:18 AM

## 2012-08-30 NOTE — Clinical Social Work Maternal (Signed)
    Clinical Social Work Department PSYCHOSOCIAL ASSESSMENT - MATERNAL/CHILD 08/30/2012  Patient:  Erica [name], Erica [name]  Account Number:  [id number]  Admit Date:  08/29/2012  Marjo Bicker Name:   Percival Spanish [name]    Clinical Social Worker:  Nobie Putnam, LCSW   Date/Time:  08/30/2012 03:48 PM  Date Referred:  08/30/2012   Referral source  CN     Referred reason  Depression/Anxiety  Substance Abuse   Other referral source:    I:  FAMILY / HOME ENVIRONMENT Child's legal guardian:  PARENT  Guardian - Name Guardian - Age Guardian - Address  Erica [name] 31 [address] [place], [place] [address]  Erica [name] 32 (same as above)   Other household support members/support persons Other support:   Family on both sides    II  PSYCHOSOCIAL DATA Information Source:  Patient Interview  Event organiser Employment:   Surveyor, quantity resources:  Media planner If [employer] - [place]:    School / Grade:   Maternity Care Coordinator / Child Services Coordination / Early Interventions:  Cultural issues impacting care:    III  STRENGTHS Strengths  Adequate Resources  Home prepared for Child (including basic supplies)  Supportive family/friends   Strength comment:    IV  RISK FACTORS AND CURRENT PROBLEMS Current Problem:  YES   Risk Factor & Current Problem Patient Issue Family Issue Risk Factor / Current Problem Comment  Mental Illness Y N Hx of depression/anxiety  Substance Abuse Y N Hx of MJ use    V  SOCIAL WORK ASSESSMENT CSW met with 70 year old, G1P1 referred for history of MJ use and anxiety disorder.  Pt admits to smoking MJ daily prior to pregnancy confirmation at 3 weeks.  Once pregnancy was confirmed, she stopped smoking for 1st trimester but started again in the 2nd.  Pt continued to smoke "occasionally," throughout the pregnancy.  She last smoked, last week.  Pt explained that her PCP and OBGYN were both aware of her use and were "okay with it," since it helped  with her anxiety.  CSW explained hospital drug testing policy and pt verbalized understanding.  UDS collection pending, as well as meconium results.  She denies other illegal substance use.  Pt has a therapist, [name], who she sees PRN.  She was initially diagnosed with depression at 52.  As pt grew older, she states her depression symptoms displayed more like anxiety symptoms. She was taking medication prior to pregnancy confirmation. She does not plan to start taking the medication right away and would like to see how she does without it.  FOB is at the bedside, aware of pt's history and supportive.  She has all the necessary supplies for the infant and appears to be bonding well.  CSW will continue to monitor drug screen results and make a referral if needed.      VI SOCIAL WORK PLAN Social Work Plan  No Further Intervention Required / No Barriers to Discharge   Type of pt/family education:   If child protective services report - county:   If child protective services report - date:   Information/referral to community resources comment:   Other social work plan:

## 2012-08-31 MED ORDER — IBUPROFEN 600 MG PO TABS: 600.0000 mg | ORAL_TABLET | Freq: Four times a day (QID) | ORAL | Status: DC

## 2012-08-31 MED ORDER — POLYSACCHARIDE IRON COMPLEX 150 MG PO CAPS: 150.0000 mg | ORAL_CAPSULE | Freq: Every day | ORAL | Status: DC

## 2012-08-31 NOTE — Discharge Summary (Signed)
Physician Discharge Summary  Patient ID: Erica [name] MRN: [id number] DOB/AGE: 1981-01-06 31 y.o.  Admit date: 08/29/2012 Discharge date: 08/31/2012  Admission Diagnoses: Spontaneous onset labor at [redacted]w[redacted]d   Discharge Diagnoses:  Active Problems:  Normal labor and delivery  Postpartum care following vaginal delivery - waterbirth (12/4)  Anemia of mother in pregnancy, delivered   Discharged Condition: stable  Hospital Course: uncomplicated course  Consults: None  Significant Diagnostic Studies: labs:normal routine antenatal, microbiology: urine culture: negative and radiology: Ultrasound: anatomy: normal  Treatments: IV hydration and analgesia: ibuprofen  Discharge Exam: Blood pressure 119/75, pulse 56, temperature 97.4 F (36.3 C), temperature source Oral, resp. rate 18, height 5\' 9"  (1.753 m), weight 99.338 kg (219 lb), SpO2 98.00%, unknown if currently breastfeeding. Mother is breastfeeding ROS: General appearance: alert, cooperative and no distress Affect: AAO x3 Lungs: CTAB CV: RRR Breasts: N/T Abdomen: Soft N/T and B/S x 4, BM x 1 Fundus: -2/u Lochia: Rubra, Min GU: no problems voiding GI: normal Extremities: No edema, no swelling bilaterally Mother is aware of PP. Depression and will contact office if getting S/S of depression. Has suffered from depression since her teens. No medication at present.   Disposition: Final discharge disposition not confirmed  Discharge Orders    Future Appointments: Provider: Department: Dept Phone: Center:   10/31/2012 9:30 AM Newt Lukes, MD [facility] [phone] [facility]     Future Orders Please Complete By Expires   Diet general      Discharge instructions      Comments:   Per [facility] Booklet       Medication List     As of 08/31/2012  8:59 AM    TAKE these medications         ibuprofen 600 MG tablet   Commonly known as: ADVIL,MOTRIN   Take 1 tablet (600 mg total) by mouth every 6  (six) hours.      iron polysaccharides 150 MG capsule   Commonly known as: NIFEREX   Take 1 capsule (150 mg total) by mouth daily.      prenatal multivitamin Tabs   Take 1 tablet by mouth daily.           Follow-up Information    Follow up with [facility] In 6 weeks.   Contact information:   [address] [place] [address] [phone]        Water birth NSVD viable Female "Erica [name]", uncomplicated course Signed: Joud Ingwersen, CNM. 08/31/2012, 8:59 AM

## 2012-08-31 NOTE — Progress Notes (Signed)
Post Partum Day 2 NSVD (Water Birth) of Viable female infant Subjective: no complaints, up ad lib without syncope, voiding, tolerating PO, + flatus, +BM  Pain well controlled with po meds, taking motrin and percocet  BF: on demand Mood stable, bonding well Contraception:  Discussed and will make decision of methodology in next 6 weeks - information given.   Objective: Blood pressure 119/75, pulse 56, temperature 97.4 F (36.3 C), temperature source Oral, resp. rate 18, height 5\' 9"  (1.753 m), weight 99.338 kg (219 lb), SpO2 98.00%, unknown if currently breastfeeding.  Physical Exam:  General: alert, cooperative and no distress Breasts: N/T  Lungs: CTAB Heart: RRR Lochia: appropriate Uterine Fundus: firm Perineum: DVT Evaluation: No evidence of DVT seen on physical exam. Negative Homan's sign. No cords or calf tenderness. No significant calf/ankle edema.   Basename 08/30/12 0530 08/29/12 0720  HGB 9.7* 11.9*  HCT 29.2* 35.3*   Anemia of pregnancy - taking Niferex 150 mg po daily  Assessment/Plan: Discharge home with baby today.       LOS: 2 days   Erica [name], CNM. 08/31/2012, 8:55 AM

## 2012-10-31 ENCOUNTER — Encounter: Payer: BC Managed Care – PPO | Admitting: Internal Medicine

## 2012-12-05 ENCOUNTER — Ambulatory Visit (INDEPENDENT_AMBULATORY_CARE_PROVIDER_SITE_OTHER): Payer: BC Managed Care – PPO | Admitting: Internal Medicine

## 2012-12-05 ENCOUNTER — Encounter: Payer: Self-pay | Admitting: Internal Medicine

## 2012-12-05 VITALS — BP 132/90 | HR 60 | Temp 97.0°F | Ht 68.0 in | Wt 181.0 lb

## 2012-12-05 DIAGNOSIS — Z0000 Encounter for general adult medical examination without abnormal findings: Secondary | ICD-10-CM

## 2012-12-05 DIAGNOSIS — O99345 Other mental disorders complicating the puerperium: Secondary | ICD-10-CM

## 2012-12-05 DIAGNOSIS — F329 Major depressive disorder, single episode, unspecified: Secondary | ICD-10-CM

## 2012-12-05 DIAGNOSIS — F411 Generalized anxiety disorder: Secondary | ICD-10-CM

## 2012-12-05 DIAGNOSIS — F53 Postpartum depression: Secondary | ICD-10-CM

## 2012-12-05 MED ORDER — SERTRALINE HCL 50 MG PO TABS: 50.0000 mg | ORAL_TABLET | Freq: Every day | ORAL | Status: DC

## 2012-12-05 NOTE — Patient Instructions (Addendum)
It was good to see you today. We have reviewed your prior records including labs and tests today I will speak with Dr Seymour Bars about medication to help treat your postpartum depression symptoms and call you after this review Until then , please continue to work with your therapist as we discussed Please schedule followup in 6 weeks for follow up and med review, call sooner if problems. Postpartum Depression and Baby Blues The postpartum period begins right after the birth of a baby. During this time, there is often a great amount of joy and excitement. It is also a time of considerable changes in the life of the parent(s). Regardless of how many times a mother gives birth, each child brings new challenges and dynamics to the family. It is not unusual to have feelings of excitement accompanied by confusing shifts in moods, emotions, and thoughts. All mothers are at risk of developing postpartum depression or the "baby blues." These mood changes can occur right after giving birth, or they may occur many months after giving birth. The baby blues or postpartum depression can be mild or severe. Additionally, postpartum depression can resolve rather quickly, or it can be a long-term condition. CAUSES Elevated hormones and their rapid decline are thought to be a main cause of postpartum depression and the baby blues. There are a number of hormones that radically change during and after pregnancy. Estrogen and progesterone usually decrease immediately after delivering your baby. The level of thyroid hormone and various cortisol steroids also rapidly drop. Other factors that play a major role in these changes include major life events and genetics.  RISK FACTORS If you have any of the following risks for the baby blues or postpartum depression, know what symptoms to watch out for during the postpartum period. Risk factors that may increase the likelihood of getting the baby blues or postpartum depression  include:  Havinga personal or family history of depression.  Having depression while being pregnant.  Having premenstrual or oral contraceptive-associated mood issues.  Having exceptional life stress.  Having marital conflict.  Lacking a social support network.  Having a baby with special needs.  Having health problems such as diabetes. SYMPTOMS Baby blues symptoms include:  Brief fluctuations in mood, such as going from extreme happiness to sadness.  Decreased concentration.  Difficulty sleeping.  Crying spells, tearfulness.  Irritability.  Anxiety. Postpartum depression symptoms typically begin within the first month after giving birth. These symptoms include:  Difficulty sleeping or excessive sleepiness.  Marked weight loss.  Agitation.  Feelings of worthlessness.  Lack of interest in activity or food. Postpartum psychosis is a very concerning condition and can be dangerous. Fortunately, it is rare. Displaying any of the following symptoms is cause for immediate medical attention. Postpartum psychosis symptoms include:  Hallucinations and delusions.  Bizarre or disorganized behavior.  Confusion or disorientation. DIAGNOSIS  A diagnosis is made by an evaluation of your symptoms. There are no medical or lab tests that lead to a diagnosis, but there are various questionnaires that a caregiver may use to identify those with the baby blues, postpartum depression, or psychosis. Often times, a screening tool called the [place] Postnatal Depression Scale is used to diagnose depression in the postpartum period.  TREATMENT The baby blues usually goes away on its own in 1 to 2 weeks. Social support is often all that is needed. You should be encouraged to get adequate sleep and rest. Occasionally, you may be given medicines to help you sleep.  Postpartum depression  requires treatment as it can last several months or longer if it is not treated. Treatment may include  individual or group therapy, medicine, or both to address any social, physiological, and psychological factors that may play a role in the depression. Regular exercise, a healthy diet, rest, and social support may also be strongly recommended.  Postpartum psychosis is more serious and needs treatment right away. Hospitalization is often needed. HOME CARE INSTRUCTIONS  Get as much rest as you can. Nap when the baby sleeps.  Exercise regularly. Some women find yoga and walking to be beneficial.  Eat a balanced and nourishing diet.  Do little things that you enjoy. Have a cup of tea, take a bubble bath, read your favorite magazine, or listen to your favorite music.  Avoid alcohol.  Ask for help with household chores, cooking, grocery shopping, or running errands as needed. Do not try to do everything.  Talk to people close to you about how you are feeling. Get support from your partner, family members, friends, or other new moms.  Try to stay positive in how you think. Think about the things you are grateful for.  Do not spend a lot of time alone.  Only take medicine as directed by your caregiver.  Keep all your postpartum appointments.  Let your caregiver know if you have any concerns. SEEK MEDICAL CARE IF: You are having a reaction or problems with your medicine. SEEK IMMEDIATE MEDICAL CARE IF:  You have suicidal feelings.  You feel you may harm the baby or someone else. Document Released: 06/16/2004 Document Revised: 12/05/2011 Document Reviewed: 07/19/2011 [facility] Patient Information 2013 [place], [place].

## 2012-12-05 NOTE — Assessment & Plan Note (Signed)
Started sertraline 09/2010, dose increase 12/2010 - improved -  Off meds 12/2011 with new pregnancy but consider resuming same if ok with gyn Seymour Bars) Encouraged to resume counseling and call if problems arise prior to follow up

## 2012-12-05 NOTE — Progress Notes (Signed)
Subjective:    Patient ID: Erica [name], female    DOB: Feb 01, 1981, 32 y.o.   MRN: [id number]  HPI patient is here today for annual physical.   Also complains of major depression - increasing symptoms in 3 months since delivery of 1st child - hx depression but off meds during pregnancy (prior sertraline)  Past Medical History  Diagnosis Date  . Anxiety   . Depression   . Allergy    Family History  Problem Relation Age of Onset  . Alcohol abuse Mother   . Alcohol abuse Other     Whole family  . Breast cancer Other   . Lung cancer Other    History  Substance Use Topics  . Smoking status: Former Games developer  . Smokeless tobacco: Not on file  . Alcohol Use: Yes     Comment: Social    Review of Systems  Constitutional: Negative for fever or unexpected weight change.  Respiratory: Negative for cough and shortness of breath.   Cardiovascular: Negative for chest pain or palpitations.  Gastrointestinal: Negative for abdominal pain, no bowel changes.  Musculoskeletal: Negative for gait problem or joint swelling.  Skin: Negative for rash.  Neurological: Negative for dizziness or headache.  No other specific complaints in a complete review of systems (except as listed in HPI above).      Objective:   Physical Exam   BP 132/90  Pulse 60  Temp(Src) 97 F (36.1 C) (Oral)  Ht 5\' 8"  (1.727 m)  Wt 181 lb (82.101 kg)  BMI 27.53 kg/m2  SpO2 98% Wt Readings from Last 3 Encounters:  12/05/12 181 lb (82.101 kg)  08/29/12 219 lb (99.338 kg)  01/09/12 177 lb 12.8 oz (80.65 kg)   Constitutional: She appears well-developed and well-nourished. No distress. infant daughter with patient today HENT: Head: Normocephalic and atraumatic. Ears: B TMs ok, no erythema or effusion; Nose: Nose normal. Mouth/Throat: Oropharynx is clear and moist. No oropharyngeal exudate.  Eyes: Conjunctivae and EOM are normal. Pupils are equal, round, and reactive to light. No scleral icterus.  Neck: Normal  range of motion. Neck supple. No JVD present. No thyromegaly present.  Cardiovascular: Normal rate, regular rhythm and normal heart sounds.  No murmur heard. No BLE edema. Pulmonary/Chest: Effort normal and breath sounds normal. No respiratory distress. She has no wheezes.  Abdominal: Soft. Bowel sounds are normal. She exhibits no distension. There is no tenderness. no masses GU: defer to gyn  Musculoskeletal: Normal range of motion, no joint effusions. No gross deformities Neurological: She is alert and oriented to person, place, and time. No cranial nerve deficit. Coordination normal.  Skin: Skin is warm and dry. No rash noted. No erythema.  Psychiatric: She has a depressed mood and tearful affect. Her behavior is normal. Judgment and thought content normal.   Lab Results  Component Value Date   WBC 19.2* 08/30/2012   HGB 9.7* 08/30/2012   HCT 29.2* 08/30/2012   PLT 244 08/30/2012   GLUCOSE 97 08/29/2012   CHOL 208* 10/31/2011   TRIG 82.0 10/31/2011   HDL 46.40 10/31/2011   LDLDIRECT 150.5 10/31/2011   LDLCALC 111* 11/02/2010   ALT 14 08/29/2012   AST 16 08/29/2012   NA 132* 08/29/2012   K 3.8 08/29/2012   CL 99 08/29/2012   CREATININE 0.65 08/29/2012   BUN 9 08/29/2012   CO2 21 08/29/2012   TSH 1.07 10/31/2011       Assessment & Plan:  CPX/v70.0 - Patient has been counseled on  age-appropriate routine health concerns for screening and prevention. These are reviewed and up-to-date. Immunizations are up-to-date or declined. Labs reviewed.  postpartum depression - ongoing breast feeding - hx major depression on meds prior to conception (sertraline) - will plan to restart sertraline if ok with her ob-gyn (call into office re: same). Verified no suicidal plan or homicidal ideation, extensive counseling and support provided to patient today -followup in 6 weeks, patient call sooner if problems. Helpline information reviewed with patient today

## 2013-01-25 ENCOUNTER — Ambulatory Visit (INDEPENDENT_AMBULATORY_CARE_PROVIDER_SITE_OTHER): Payer: BC Managed Care – PPO | Admitting: Internal Medicine

## 2013-01-25 ENCOUNTER — Ambulatory Visit (INDEPENDENT_AMBULATORY_CARE_PROVIDER_SITE_OTHER): Payer: BC Managed Care – PPO

## 2013-01-25 ENCOUNTER — Encounter: Payer: Self-pay | Admitting: Internal Medicine

## 2013-01-25 VITALS — BP 122/80 | HR 47 | Temp 97.0°F | Wt 177.8 lb

## 2013-01-25 DIAGNOSIS — Z0000 Encounter for general adult medical examination without abnormal findings: Secondary | ICD-10-CM

## 2013-01-25 DIAGNOSIS — F411 Generalized anxiety disorder: Secondary | ICD-10-CM

## 2013-01-25 DIAGNOSIS — F329 Major depressive disorder, single episode, unspecified: Secondary | ICD-10-CM

## 2013-01-25 LAB — CBC WITH DIFFERENTIAL/PLATELET
Basophils Relative: 0.4 % (ref 0.0–3.0)
Eosinophils Absolute: 0.1 10*3/uL (ref 0.0–0.7)
HCT: 39 % (ref 36.0–46.0)
Hemoglobin: 13.3 g/dL (ref 12.0–15.0)
Lymphs Abs: 2.9 10*3/uL (ref 0.7–4.0)
MCHC: 34 g/dL (ref 30.0–36.0)
MCV: 80.9 fl (ref 78.0–100.0)
Monocytes Absolute: 0.6 10*3/uL (ref 0.1–1.0)
Neutro Abs: 4.5 10*3/uL (ref 1.4–7.7)
Neutrophils Relative %: 54.8 % (ref 43.0–77.0)
RBC: 4.83 Mil/uL (ref 3.87–5.11)

## 2013-01-25 LAB — URINALYSIS, ROUTINE W REFLEX MICROSCOPIC
Bilirubin Urine: NEGATIVE
Hgb urine dipstick: NEGATIVE
Ketones, ur: NEGATIVE
Urine Glucose: NEGATIVE
Urobilinogen, UA: 0.2 (ref 0.0–1.0)

## 2013-01-25 MED ORDER — SERTRALINE HCL 100 MG PO TABS: 100.0000 mg | ORAL_TABLET | Freq: Every day | ORAL | Status: DC

## 2013-01-25 NOTE — Patient Instructions (Signed)
It was good to see you today. We have reviewed your prior records including labs and tests today Increase sertraline to 100mg  daily - Your prescription(s) have been submitted to your pharmacy. Please take as directed and contact our office if you believe you are having problem(s) with the medication(s). Other Medications reviewed and updated, no additional changes recommended at this time. Test(s) ordered today. Your results will be released to MyChart (or called to you) after review, usually within 72hours after test completion. If any changes need to be made, you will be notified at that same time.  Please schedule followup in 6 months for follow up and med review, call sooner if problems.

## 2013-01-25 NOTE — Assessment & Plan Note (Signed)
Started sertraline 09/2010, dose increase 12/2010 - improved -  Off meds 12/2011 with pregnancy Still breast feeding, but resumed tx 11/2012 (discussed with gyn Lavoie) Titrate to max dose now - erx done Encouraged to continue counseling and call if problems arise prior to follow up -6 mo

## 2013-01-25 NOTE — Progress Notes (Signed)
  Subjective:    Patient ID: Erica [name], female    DOB: [birth year], 32 y.o.   MRN: [id number]  HPI  Here for follow up - depression, acute exacerbation by postpartum state Resumed sertraline, in counseling symptoms have improved    Past Medical History  Diagnosis Date  . Anxiety   . Depression   . Allergy     Review of Systems  Constitutional: Negative for fever and fatigue.  Psychiatric/Behavioral: Negative for dysphoric mood, decreased concentration and agitation.       Objective:   Physical Exam BP 122/80  Pulse 47  Temp(Src) 97 F (36.1 C) (Oral)  Wt 177 lb 12.8 oz (80.65 kg)  BMI 27.04 kg/m2  SpO2 99% Wt Readings from Last 3 Encounters:  01/25/13 177 lb 12.8 oz (80.65 kg)  12/05/12 181 lb (82.101 kg)  08/29/12 219 lb (99.338 kg)   Constitutional: She appears well-developed and well-nourished. No distress. Infant dtr at side Cardiovascular: Normal rate, regular rhythm and normal heart sounds.  No murmur heard. No BLE edema. Pulmonary/Chest: Effort normal and breath sounds normal. No respiratory distress. She has no wheezes. Psychiatric: She has a much brighter mood and affect. Her behavior is normal. Judgment and thought content normal.   Lab Results  Component Value Date   WBC 19.2* 08/30/2012   HGB 9.7* 08/30/2012   HCT 29.2* 08/30/2012   PLT 244 08/30/2012   GLUCOSE 97 08/29/2012   CHOL 208* 10/31/2011   TRIG 82.0 10/31/2011   HDL 46.40 10/31/2011   LDLDIRECT 150.5 10/31/2011   LDLCALC 111* 11/02/2010   ALT 14 08/29/2012   AST 16 08/29/2012   NA 132* 08/29/2012   K 3.8 08/29/2012   CL 99 08/29/2012   CREATININE 0.65 08/29/2012   BUN 9 08/29/2012   CO2 21 08/29/2012   TSH 1.07 10/31/2011       Assessment & Plan:   See problem list. Medications and labs reviewed today.

## 2013-01-28 LAB — BASIC METABOLIC PANEL
CO2: 21 mEq/L (ref 19–32)
Chloride: 107 mEq/L (ref 96–112)
Creatinine, Ser: 1 mg/dL (ref 0.4–1.2)

## 2013-01-28 LAB — HEPATIC FUNCTION PANEL
Albumin: 4.7 g/dL (ref 3.5–5.2)
Alkaline Phosphatase: 79 U/L (ref 39–117)
Bilirubin, Direct: 0.1 mg/dL (ref 0.0–0.3)

## 2013-01-28 LAB — LIPID PANEL
Total CHOL/HDL Ratio: 5
Triglycerides: 81 mg/dL (ref 0.0–149.0)

## 2013-01-28 LAB — LDL CHOLESTEROL, DIRECT: Direct LDL: 154.6 mg/dL

## 2013-05-01 ENCOUNTER — Other Ambulatory Visit: Payer: Self-pay | Admitting: Internal Medicine

## 2013-05-01 NOTE — Telephone Encounter (Signed)
[facility] to [facility].../lmb 

## 2013-08-02 ENCOUNTER — Encounter: Payer: Self-pay | Admitting: Internal Medicine

## 2013-08-02 ENCOUNTER — Ambulatory Visit (INDEPENDENT_AMBULATORY_CARE_PROVIDER_SITE_OTHER): Payer: BC Managed Care – PPO | Admitting: Internal Medicine

## 2013-08-02 VITALS — BP 120/82 | HR 52 | Temp 98.5°F | Wt 188.4 lb

## 2013-08-02 DIAGNOSIS — F329 Major depressive disorder, single episode, unspecified: Secondary | ICD-10-CM

## 2013-08-02 NOTE — Progress Notes (Signed)
Pre visit review using our clinic review tool, if applicable. No additional management support is needed unless otherwise documented below in the visit note. 

## 2013-08-02 NOTE — Progress Notes (Signed)
  Subjective:    Patient ID: Erica [name], female    DOB: 1980-11-15, 32 y.o.   MRN: [id number]  HPI  Here for follow up - depression, acute exacerbation by postpartum state and marital issues Resumed sertraline 2014, in counseling symptoms have improved, but situation persists   Past Medical History  Diagnosis Date  . Anxiety   . Depression   . Allergy     Review of Systems  Constitutional: Negative for fever and fatigue.  Psychiatric/Behavioral: Positive for dysphoric mood. Negative for suicidal ideas, hallucinations, sleep disturbance, self-injury, decreased concentration and agitation. The patient is not nervous/anxious.        Objective:   Physical Exam BP 120/82  Pulse 52  Temp(Src) 98.5 F (36.9 C) (Oral)  Wt 188 lb 6.4 oz (85.458 kg)  SpO2 98% Wt Readings from Last 3 Encounters:  08/02/13 188 lb 6.4 oz (85.458 kg)  01/25/13 177 lb 12.8 oz (80.65 kg)  12/05/12 181 lb (82.101 kg)   Constitutional: She appears well-developed and well-nourished. No distress.  Cardiovascular: Normal rate, regular rhythm and normal heart sounds.  No murmur heard. No BLE edema. Pulmonary/Chest: Effort normal and breath sounds normal. No respiratory distress. She has no wheezes. Psychiatric: She has a dysphoric mood and affect. Her behavior is normal. Judgment and thought content normal.   Lab Results  Component Value Date   WBC 8.1 01/25/2013   HGB 13.3 01/25/2013   HCT 39.0 01/25/2013   PLT 291.0 01/25/2013   GLUCOSE 80 01/25/2013   CHOL 215* 01/25/2013   TRIG 81.0 01/25/2013   HDL 44.90 01/25/2013   LDLDIRECT 154.6 01/25/2013   LDLCALC 111* 11/02/2010   ALT 29 01/25/2013   AST 45* 01/25/2013   NA 144 01/25/2013   K 4.6 01/25/2013   CL 107 01/25/2013   CREATININE 1.0 01/25/2013   BUN 16 01/25/2013   CO2 21 01/25/2013   TSH 1.80 01/25/2013       Assessment & Plan:   See problem list. Medications and labs reviewed today.

## 2013-08-02 NOTE — Patient Instructions (Signed)
It was good to see you today.  We have reviewed your prior records including labs and tests today  Medications reviewed and updated, no changes recommended at this time.  Please schedule followup in 6 months for annual exam and labs, call sooner if problems.  

## 2013-08-02 NOTE — Assessment & Plan Note (Signed)
Started sertraline 09/2010, dose increase 12/2010 - improved -  Off meds 12/2011 with pregnancy Still breast feeding, but resumed tx 11/2012 (discussed with gyn Lavoie) Titrate to max dose 01/2013 - stable symptoms but persisiting stressors Encouraged to continue counseling and call if problems arise prior to follow up -6 m

## 2013-12-02 ENCOUNTER — Other Ambulatory Visit: Payer: Self-pay | Admitting: Internal Medicine

## 2013-12-25 LAB — HM PAP SMEAR

## 2014-01-17 ENCOUNTER — Other Ambulatory Visit: Payer: Self-pay | Admitting: Internal Medicine

## 2014-02-19 ENCOUNTER — Ambulatory Visit (INDEPENDENT_AMBULATORY_CARE_PROVIDER_SITE_OTHER): Payer: BC Managed Care – PPO | Admitting: Internal Medicine

## 2014-02-19 ENCOUNTER — Encounter: Payer: Self-pay | Admitting: Internal Medicine

## 2014-02-19 VITALS — BP 118/78 | HR 57 | Temp 98.2°F | Wt 199.0 lb

## 2014-02-19 DIAGNOSIS — F411 Generalized anxiety disorder: Secondary | ICD-10-CM

## 2014-02-19 DIAGNOSIS — Z0000 Encounter for general adult medical examination without abnormal findings: Secondary | ICD-10-CM

## 2014-02-19 MED ORDER — DULOXETINE HCL 30 MG PO CPEP: 30.0000 mg | ORAL_CAPSULE | Freq: Every day | ORAL | Status: DC

## 2014-02-19 NOTE — Assessment & Plan Note (Signed)
Chronic symptoms  Started sertraline 09/2010, dose increase 12/2010 - improved -  Off meds 12/2011 with pregnancy Still breast feeding, but resumed tx 11/2012 (discussed with gyn Lavoie) Titrate to max dose 01/2013 -  Recurrent symptoms with persisiting stressors Change sertraline to cymbalta we reviewed potential risk/benefit and possible side effects - pt understands and agrees to same  Encouraged to continue counseling and call if problems arise prior to follow up in 6-12 weeks

## 2014-02-19 NOTE — Progress Notes (Signed)
Subjective:    Patient ID: Erica [name], female    DOB: [birth year], 33 y.o.   MRN: [id number]  HPI  patient is here today for annual physical.   Patient feels well but increasing anxiety symptoms - in counseling for same  Also reviewed chronic medical issues and interval medical events  Past Medical History  Diagnosis Date  . Anxiety   . Depression   . Allergy    Family History  Problem Relation Age of Onset  . Alcohol abuse Mother   . Alcohol abuse Other     Whole family  . Breast cancer Other   . Lung cancer Other    History  Substance Use Topics  . Smoking status: Former Games developermoker  . Smokeless tobacco: Not on file  . Alcohol Use: Yes     Comment: Social    Review of Systems  Constitutional: Positive for fatigue and unexpected weight change. Negative for fever.  Respiratory: Negative for cough and shortness of breath.   Cardiovascular: Negative for chest pain and leg swelling.  Psychiatric/Behavioral: Positive for dysphoric mood and decreased concentration. Negative for suicidal ideas, sleep disturbance and self-injury. The patient is nervous/anxious.   All other systems reviewed and are negative.      Objective:   Physical Exam  BP 118/78  Pulse 57  Temp(Src) 98.2 F (36.8 C) (Oral)  Wt 199 lb (90.266 kg)  SpO2 97% Wt Readings from Last 3 Encounters:  02/19/14 199 lb (90.266 kg)  08/02/13 188 lb 6.4 oz (85.458 kg)  01/25/13 177 lb 12.8 oz (80.65 kg)   Constitutional: She is obese, but appears well-developed and well-nourished. No distress.  HENT: Head: Normocephalic and atraumatic. Ears: B TMs ok, no erythema or effusion; Nose: Nose normal. Mouth/Throat: Oropharynx is clear and moist. No oropharyngeal exudate.  Eyes: Conjunctivae and EOM are normal. Pupils are equal, round, and reactive to light. No scleral icterus.  Neck: Normal range of motion. Neck supple. No JVD present. No thyromegaly present.  Cardiovascular: Normal rate, regular rhythm and normal  heart sounds.  No murmur heard. No BLE edema. Pulmonary/Chest: Effort normal and breath sounds normal. No respiratory distress. She has no wheezes.  Abdominal: Soft. Bowel sounds are normal. She exhibits no distension. There is no tenderness. no masses Musculoskeletal: Normal range of motion, no joint effusions. No gross deformities Neurological: She is alert and oriented to person, place, and time. No cranial nerve deficit. Coordination, balance, strength, speech and gait are normal.  Skin: Skin is warm and dry. No rash noted. No erythema. Psychiatric: She has a tearful, dysphoric mood and affect. Her behavior is normal. Judgment and thought content normal.   Lab Results  Component Value Date   WBC 8.1 01/25/2013   HGB 13.3 01/25/2013   HCT 39.0 01/25/2013   PLT 291.0 01/25/2013   GLUCOSE 80 01/25/2013   CHOL 215* 01/25/2013   TRIG 81.0 01/25/2013   HDL 44.90 01/25/2013   LDLDIRECT 154.6 01/25/2013   LDLCALC 111* 11/02/2010   ALT 29 01/25/2013   AST 45* 01/25/2013   NA 144 01/25/2013   K 4.6 01/25/2013   CL 107 01/25/2013   CREATININE 1.0 01/25/2013   BUN 16 01/25/2013   CO2 21 01/25/2013   TSH 1.80 01/25/2013    No results found.     Assessment & Plan:   CPX/v70.0 - Patient has been counseled on age-appropriate routine health concerns for screening and prevention. These are reviewed and up-to-date. Immunizations are up-to-date or declined. Labs and  ECG reviewed.  Problem List Items Addressed This Visit   ANXIETY STATE, UNSPECIFIED     Chronic symptoms  Started sertraline 09/2010, dose increase 12/2010 - improved -  Off meds 12/2011 with pregnancy Still breast feeding, but resumed tx 11/2012 (discussed with gyn Lavoie) Titrate to max dose 01/2013 -  Recurrent symptoms with persisiting stressors Change sertraline to cymbalta we reviewed potential risk/benefit and possible side effects - pt understands and agrees to same  Encouraged to continue counseling and call if problems arise prior to follow up in 6-12  weeks    Relevant Medications      DULoxetine (CYMBALTA) DR capsule    Other Visit Diagnoses   Routine general medical examination at a health care facility    -  Primary    Relevant Orders       Basic metabolic panel       CBC with Differential       Hepatic function panel       Lipid panel       TSH       Urinalysis, Routine w reflex microscopic

## 2014-02-19 NOTE — Progress Notes (Signed)
Pre visit review using our clinic review tool, if applicable. No additional management support is needed unless otherwise documented below in the visit note. 

## 2014-02-19 NOTE — Patient Instructions (Addendum)
It was good to see you today.  We have reviewed your prior records including labs and tests today  Health Maintenance reviewed - all recommended immunizations and age-appropriate screenings are up-to-date.  Test(s) ordered today. Return when you're fasting for best results. Your results will be released to [place] (or called to you) after review, usually within 72hours after test completion. If any changes need to be made, you will be notified at that same time.  Medications reviewed and updated Wean off sertraline as discussed to begin Cymbalta in 2 weeks - no other changes recommended at this time.  Your prescription(s) have been submitted to your pharmacy. Please take as directed and contact our office if you believe you are having problem(s) with the medication(s).  Please schedule followup in 6-12 weeks for symptoms recheck and medication titration as neded, please call sooner if problems  Health Maintenance, Female A healthy lifestyle and preventative care can promote health and wellness.  Maintain regular health, dental, and eye exams.  Eat a healthy diet. Foods like vegetables, fruits, whole grains, low-fat dairy products, and lean protein foods contain the nutrients you need without too many calories. Decrease your intake of foods high in solid fats, added sugars, and salt. Get information about a proper diet from your caregiver, if necessary.  Regular physical exercise is one of the most important things you can do for your health. Most adults should get at least 150 minutes of moderate-intensity exercise (any activity that increases your heart rate and causes you to sweat) each week. In addition, most adults need muscle-strengthening exercises on 2 or more days a week.   Maintain a healthy weight. The body mass index (BMI) is a screening tool to identify possible weight problems. It provides an estimate of body fat based on height and weight. Your caregiver can help determine your  BMI, and can help you achieve or maintain a healthy weight. For adults 20 years and older:  A BMI below 18.5 is considered underweight.  A BMI of 18.5 to 24.9 is normal.  A BMI of 25 to 29.9 is considered overweight.  A BMI of 30 and above is considered obese.  Maintain normal blood lipids and cholesterol by exercising and minimizing your intake of saturated fat. Eat a balanced diet with plenty of fruits and vegetables. Blood tests for lipids and cholesterol should begin at age 8 and be repeated every 5 years. If your lipid or cholesterol levels are high, you are over 50, or you are a high risk for heart disease, you may need your cholesterol levels checked more frequently.Ongoing high lipid and cholesterol levels should be treated with medicines if diet and exercise are not effective.  If you smoke, find out from your caregiver how to quit. If you do not use tobacco, do not start.  Lung cancer screening is recommended for adults aged 43 80 years who are at high risk for developing lung cancer because of a history of smoking. Yearly low-dose computed tomography (CT) is recommended for people who have at least a 30-pack-year history of smoking and are a current smoker or have quit within the past 15 years. A pack year of smoking is smoking an average of 1 pack of cigarettes a day for 1 year (for example: 1 pack a day for 30 years or 2 packs a day for 15 years). Yearly screening should continue until the smoker has stopped smoking for at least 15 years. Yearly screening should also be stopped for people who  develop a health problem that would prevent them from having lung cancer treatment.  If you are pregnant, do not drink alcohol. If you are breastfeeding, be very cautious about drinking alcohol. If you are not pregnant and choose to drink alcohol, do not exceed 1 drink per day. One drink is considered to be 12 ounces (355 mL) of beer, 5 ounces (148 mL) of wine, or 1.5 ounces (44 mL) of  liquor.  Avoid use of street drugs. Do not share needles with anyone. Ask for help if you need support or instructions about stopping the use of drugs.  High blood pressure causes heart disease and increases the risk of stroke. Blood pressure should be checked at least every 1 to 2 years. Ongoing high blood pressure should be treated with medicines, if weight loss and exercise are not effective.  If you are 59 to 35 years old, ask your caregiver if you should take aspirin to prevent strokes.  Diabetes screening involves taking a blood sample to check your fasting blood sugar level. This should be done once every 3 years, after age 44, if you are within normal weight and without risk factors for diabetes. Testing should be considered at a younger age or be carried out more frequently if you are overweight and have at least 1 risk factor for diabetes.  Breast cancer screening is essential preventative care for women. You should practice "breast self-awareness." This means understanding the normal appearance and feel of your breasts and may include breast self-examination. Any changes detected, no matter how small, should be reported to a caregiver. Women in their 74s and 30s should have a clinical breast exam (CBE) by a caregiver as part of a regular health exam every 1 to 3 years. After age 43, women should have a CBE every year. Starting at age 25, women should consider having a mammogram (breast X-ray) every year. Women who have a family history of breast cancer should talk to their caregiver about genetic screening. Women at a high risk of breast cancer should talk to their caregiver about having an MRI and a mammogram every year.  Breast cancer gene (BRCA)-related cancer risk assessment is recommended for women who have family members with BRCA-related cancers. BRCA-related cancers include breast, ovarian, tubal, and peritoneal cancers. Having family members with these cancers may be associated with  an increased risk for harmful changes (mutations) in the breast cancer genes BRCA1 and BRCA2. Results of the assessment will determine the need for genetic counseling and BRCA1 and BRCA2 testing.  The Pap test is a screening test for cervical cancer. Women should have a Pap test starting at age 62. Between ages 13 and 43, Pap tests should be repeated every 2 years. Beginning at age 59, you should have a Pap test every 3 years as long as the past 3 Pap tests have been normal. If you had a hysterectomy for a problem that was not cancer or a condition that could lead to cancer, then you no longer need Pap tests. If you are between ages 72 and 53, and you have had normal Pap tests going back 10 years, you no longer need Pap tests. If you have had past treatment for cervical cancer or a condition that could lead to cancer, you need Pap tests and screening for cancer for at least 20 years after your treatment. If Pap tests have been discontinued, risk factors (such as a new sexual partner) need to be reassessed to determine if screening should  be resumed. Some women have medical problems that increase the chance of getting cervical cancer. In these cases, your caregiver may recommend more frequent screening and Pap tests.  The human papillomavirus (HPV) test is an additional test that may be used for cervical cancer screening. The HPV test looks for the virus that can cause the cell changes on the cervix. The cells collected during the Pap test can be tested for HPV. The HPV test could be used to screen women aged 63 years and older, and should be used in women of any age who have unclear Pap test results. After the age of 76, women should have HPV testing at the same frequency as a Pap test.  Colorectal cancer can be detected and often prevented. Most routine colorectal cancer screening begins at the age of 64 and continues through age 65. However, your caregiver may recommend screening at an earlier age if you  have risk factors for colon cancer. On a yearly basis, your caregiver may provide home test kits to check for hidden blood in the stool. Use of a small camera at the end of a tube, to directly examine the colon (sigmoidoscopy or colonoscopy), can detect the earliest forms of colorectal cancer. Talk to your caregiver about this at age 29, when routine screening begins. Direct examination of the colon should be repeated every 5 to 10 years through age 3, unless early forms of pre-cancerous polyps or small growths are found.  Hepatitis C blood testing is recommended for all people born from [birth year] through 1967 and any individual with known risks for hepatitis C.  Practice safe sex. Use condoms and avoid high-risk sexual practices to reduce the spread of sexually transmitted infections (STIs). Sexually active women aged 52 and younger should be checked for Chlamydia, which is a common sexually transmitted infection. Older women with new or multiple partners should also be tested for Chlamydia. Testing for other STIs is recommended if you are sexually active and at increased risk.  Osteoporosis is a disease in which the bones lose minerals and strength with aging. This can result in serious bone fractures. The risk of osteoporosis can be identified using a bone density scan. Women ages 46 and over and women at risk for fractures or osteoporosis should discuss screening with their caregivers. Ask your caregiver whether you should be taking a calcium supplement or vitamin D to reduce the rate of osteoporosis.  Menopause can be associated with physical symptoms and risks. Hormone replacement therapy is available to decrease symptoms and risks. You should talk to your caregiver about whether hormone replacement therapy is right for you.  Use sunscreen. Apply sunscreen liberally and repeatedly throughout the day. You should seek shade when your shadow is shorter than you. Protect yourself by wearing long sleeves,  pants, a wide-brimmed hat, and sunglasses year round, whenever you are outdoors.  Notify your caregiver of new moles or changes in moles, especially if there is a change in shape or color. Also notify your caregiver if a mole is larger than the size of a pencil eraser.  Stay current with your immunizations. Document Released: 03/28/2011 Document Revised: 01/07/2013 Document Reviewed: 03/28/2011 [facility] Patient Information 2014 [place].

## 2014-03-15 ENCOUNTER — Other Ambulatory Visit: Payer: Self-pay | Admitting: Internal Medicine

## 2014-03-17 NOTE — Telephone Encounter (Signed)
MD out of office. Refill okay? 

## 2014-03-19 ENCOUNTER — Telehealth: Payer: Self-pay | Admitting: *Deleted

## 2014-03-19 NOTE — Telephone Encounter (Signed)
Is she breast feeding? Thx

## 2014-03-19 NOTE — Telephone Encounter (Signed)
Pt stated she is needing refill on her alprazolam. Is this ok to refill.Marland Kitchen.Raechel Chute/lmb

## 2014-03-19 NOTE — Telephone Encounter (Signed)
No pt has already had her baby...Erica [name]/mb

## 2014-03-20 MED ORDER — ALPRAZOLAM 0.25 MG PO TABS: 0.2500 mg | ORAL_TABLET | Freq: Every day | ORAL | Status: DC | PRN

## 2014-03-20 NOTE — Telephone Encounter (Signed)
OK #30, no refills Sch F/u w/r Felicity CoyerLeschber Thx

## 2014-03-20 NOTE — Telephone Encounter (Signed)
Tried calling pt # has been disconnected. Faxing script into pharmacy...Raechel Chute/lmb

## 2014-04-16 ENCOUNTER — Telehealth: Payer: Self-pay | Admitting: Internal Medicine

## 2014-04-16 ENCOUNTER — Other Ambulatory Visit (INDEPENDENT_AMBULATORY_CARE_PROVIDER_SITE_OTHER): Payer: BC Managed Care – PPO

## 2014-04-16 ENCOUNTER — Ambulatory Visit: Payer: BC Managed Care – PPO | Admitting: Internal Medicine

## 2014-04-16 DIAGNOSIS — Z0000 Encounter for general adult medical examination without abnormal findings: Secondary | ICD-10-CM

## 2014-04-16 LAB — URINALYSIS, ROUTINE W REFLEX MICROSCOPIC
Bilirubin Urine: NEGATIVE
HGB URINE DIPSTICK: NEGATIVE
Ketones, ur: NEGATIVE
Nitrite: NEGATIVE
Renal Epithel, UA: NONE SEEN
SPECIFIC GRAVITY, URINE: 1.02 (ref 1.000–1.030)
Total Protein, Urine: 30 — AB
URINE GLUCOSE: NEGATIVE
UROBILINOGEN UA: 0.2 (ref 0.0–1.0)
pH: 7 (ref 5.0–8.0)

## 2014-04-16 LAB — CBC WITH DIFFERENTIAL/PLATELET
BASOS PCT: 0.4 % (ref 0.0–3.0)
Basophils Absolute: 0 10*3/uL (ref 0.0–0.1)
EOS ABS: 0.1 10*3/uL (ref 0.0–0.7)
Eosinophils Relative: 1.7 % (ref 0.0–5.0)
HEMATOCRIT: 38.8 % (ref 36.0–46.0)
Hemoglobin: 12.8 g/dL (ref 12.0–15.0)
Lymphocytes Relative: 42.6 % (ref 12.0–46.0)
Lymphs Abs: 3 10*3/uL (ref 0.7–4.0)
MCHC: 33 g/dL (ref 30.0–36.0)
MCV: 86.1 fl (ref 78.0–100.0)
MONO ABS: 0.5 10*3/uL (ref 0.1–1.0)
Monocytes Relative: 7.1 % (ref 3.0–12.0)
NEUTROS PCT: 48.2 % (ref 43.0–77.0)
Neutro Abs: 3.3 10*3/uL (ref 1.4–7.7)
Platelets: 303 10*3/uL (ref 150.0–400.0)
RBC: 4.51 Mil/uL (ref 3.87–5.11)
RDW: 13.7 % (ref 11.5–15.5)
WBC: 6.9 10*3/uL (ref 4.0–10.5)

## 2014-04-16 LAB — BASIC METABOLIC PANEL
BUN: 11 mg/dL (ref 6–23)
CO2: 30 meq/L (ref 19–32)
CREATININE: 0.8 mg/dL (ref 0.4–1.2)
Calcium: 9.2 mg/dL (ref 8.4–10.5)
Chloride: 105 mEq/L (ref 96–112)
GFR: 81.97 mL/min (ref >60.00)
Glucose, Bld: 84 mg/dL (ref 70–99)
POTASSIUM: 4.4 meq/L (ref 3.5–5.1)
Sodium: 139 mEq/L (ref 135–145)

## 2014-04-16 LAB — HEPATIC FUNCTION PANEL
ALK PHOS: 61 U/L (ref 39–117)
ALT: 18 U/L (ref 0–35)
AST: 23 U/L (ref 0–37)
Albumin: 3.8 g/dL (ref 3.5–5.2)
Bilirubin, Direct: 0.1 mg/dL (ref 0.0–0.3)
TOTAL PROTEIN: 7 g/dL (ref 6.0–8.3)
Total Bilirubin: 0.6 mg/dL (ref 0.2–1.2)

## 2014-04-16 LAB — LIPID PANEL
CHOL/HDL RATIO: 6
Cholesterol: 198 mg/dL (ref 0–200)
HDL: 34.3 mg/dL — AB (ref >39.00)
LDL Cholesterol: 133 mg/dL — ABNORMAL HIGH (ref 0–99)
NONHDL: 163.7
Triglycerides: 156 mg/dL — ABNORMAL HIGH (ref 0.0–149.0)
VLDL: 31.2 mg/dL (ref 0.0–40.0)

## 2014-04-16 LAB — TSH: TSH: 1.27 u[IU]/mL (ref 0.35–4.50)

## 2014-04-16 NOTE — Telephone Encounter (Signed)
Pt request to be work in due to how much stress she is in right now. Pt was schedule on 04/16/14 but we had to reschedule. Please advise. Pt does not want to see another doctor.

## 2014-04-17 NOTE — Telephone Encounter (Signed)
Can she come in 815am on Tue 7/28? If so, please arrange

## 2014-04-17 NOTE — Telephone Encounter (Signed)
appt is set.  °

## 2014-04-22 ENCOUNTER — Ambulatory Visit (INDEPENDENT_AMBULATORY_CARE_PROVIDER_SITE_OTHER): Payer: BC Managed Care – PPO | Admitting: Internal Medicine

## 2014-04-22 ENCOUNTER — Encounter: Payer: Self-pay | Admitting: Internal Medicine

## 2014-04-22 VITALS — BP 120/78 | HR 50 | Temp 98.2°F | Ht 68.0 in | Wt 205.5 lb

## 2014-04-22 DIAGNOSIS — M5380 Other specified dorsopathies, site unspecified: Secondary | ICD-10-CM

## 2014-04-22 DIAGNOSIS — M62830 Muscle spasm of back: Secondary | ICD-10-CM

## 2014-04-22 DIAGNOSIS — F411 Generalized anxiety disorder: Secondary | ICD-10-CM

## 2014-04-22 MED ORDER — ALPRAZOLAM 0.5 MG PO TABS: 0.5000 mg | ORAL_TABLET | Freq: Three times a day (TID) | ORAL | Status: DC | PRN

## 2014-04-22 MED ORDER — CYCLOBENZAPRINE HCL 5 MG PO TABS: 5.0000 mg | ORAL_TABLET | Freq: Three times a day (TID) | ORAL | Status: DC | PRN

## 2014-04-22 MED ORDER — DULOXETINE HCL 60 MG PO CPEP: 60.0000 mg | ORAL_CAPSULE | Freq: Every day | ORAL | Status: DC

## 2014-04-22 MED ORDER — NORETHINDRONE 0.35 MG PO TABS: 1.0000 | ORAL_TABLET | Freq: Every day | ORAL | Status: DC

## 2014-04-22 NOTE — Assessment & Plan Note (Signed)
Chronic symptoms  Started sertraline 09/2010, dose increase 12/2010 - improved -  Off meds 12/2011 with pregnancy,then resumed tx 11/2012 per discussion with gyn Lavoie Titrate to max dose 01/2013 -  Recurrent symptoms with persisiting stressors Changed sertraline to cymbalta 01/2014 - improved but not at goal Increase to 60mg  qd now and increase dose xanax 0.5 tid prn we reviewed potential risk/benefit and possible side effects - pt understands and agrees to same  Encouraged to continue counseling and call if problems arise prior to follow up in 6-12 weeks

## 2014-04-22 NOTE — Progress Notes (Signed)
Subjective:    Patient ID: Erica [name], female    DOB: [birth year], 33 y.o.   MRN: [id number]  HPI  Patient is here for follow up  Reviewed chronic medical issues and interval medical events  Past Medical History  Diagnosis Date  . Anxiety   . Depression   . Allergy    Review of Systems  Constitutional: Positive for fatigue and unexpected weight change (gain reviewed). Negative for fever.  Genitourinary: Negative for urgency, hematuria, flank pain and dyspareunia.  Musculoskeletal: Positive for back pain (x 1 week, L>R trap and lower back "spasm" after lifting - slightly improved with ibuprofen, no radiation into leg).  Psychiatric/Behavioral: Positive for dysphoric mood and decreased concentration. Negative for suicidal ideas and self-injury. The patient is nervous/anxious.        Objective:   Physical Exam  BP 120/78  Pulse 50  Temp(Src) 98.2 F (36.8 C) (Oral)  Ht 5\' 8"  (1.727 m)  Wt 205 lb 8 oz (93.214 kg)  BMI 31.25 kg/m2  SpO2 98% Wt Readings from Last 3 Encounters:  04/22/14 205 lb 8 oz (93.214 kg)  02/19/14 199 lb (90.266 kg)  08/02/13 188 lb 6.4 oz (85.458 kg)   Constitutional: She is overweight and appears well-developed and well-nourished. No physical distress.  Neck: Normal range of motion. Neck supple. No JVD present. No thyromegaly present.  Cardiovascular: Normal rate, regular rhythm and normal heart sounds.  No murmur heard. No BLE edema. Pulmonary/Chest: Effort normal and breath sounds normal. No respiratory distress. She has no wheezes.  MSkel: Back: full range of motion of thoracic and lumbar spine. Spasm left lower back, min tender to palpation. Negative straight leg raise. DTR's are symmetrically intact. Sensation intact in all dermatomes of the lower extremities. Full strength to manual muscle testing. patient is able to heel toe walk without difficulty and ambulates with normal gait. Psychiatric: She has a appropriate dysphoric and occ tearful  mood and affect. Her behavior is normal. Judgment and thought content normal.   Lab Results  Component Value Date   WBC 6.9 04/16/2014   HGB 12.8 04/16/2014   HCT 38.8 04/16/2014   PLT 303.0 04/16/2014   GLUCOSE 84 04/16/2014   CHOL 198 04/16/2014   TRIG 156.0* 04/16/2014   HDL 34.30* 04/16/2014   LDLDIRECT 154.6 01/25/2013   LDLCALC 133* 04/16/2014   ALT 18 04/16/2014   AST 23 04/16/2014   NA 139 04/16/2014   K 4.4 04/16/2014   CL 105 04/16/2014   CREATININE 0.8 04/16/2014   BUN 11 04/16/2014   CO2 30 04/16/2014   TSH 1.27 04/16/2014    No results found.     Assessment & Plan:   Back spasm - appears MSkel - tx with ibuprofen 600 tid and muscle relaxer - erx done - pt will call if worse or unimproved with conservative care  Problem List Items Addressed This Visit   ANXIETY STATE, UNSPECIFIED - Primary     Chronic symptoms  Started sertraline 09/2010, dose increase 12/2010 - improved -  Off meds 12/2011 with pregnancy,then resumed tx 11/2012 per discussion with gyn Lavoie Titrate to max dose 01/2013 -  Recurrent symptoms with persisiting stressors Changed sertraline to cymbalta 01/2014 - improved but not at goal Increase to 60mg  qd now and increase dose xanax 0.5 tid prn we reviewed potential risk/benefit and possible side effects - pt understands and agrees to same  Encouraged to continue counseling and call if problems arise prior to follow up in 6-12  weeks    Relevant Medications      DULoxetine (CYMBALTA) DR capsule      ALPRAZolam Prudy Feeler(XANAX) tablet

## 2014-04-22 NOTE — Progress Notes (Signed)
Pre visit review using our clinic review tool, if applicable. No additional management support is needed unless otherwise documented below in the visit note. 

## 2014-04-22 NOTE — Patient Instructions (Signed)
It was good to see you today.  We have reviewed your prior records including labs and tests today  Medications reviewed and updated Increase dose cymbaltat and xanax as discussed Use ibuprofen and flexeril muscle relaxer for back spasm/pain Refill on birth control Your prescription(s) have been submitted to your pharmacy. Please take as directed and contact our office if you believe you are having problem(s) with the medication(s).  Continue working wit Photographeryour counselor as ongoing -   Please schedule followup in 4-6 months, call sooner if problems.

## 2014-05-20 ENCOUNTER — Ambulatory Visit: Payer: BC Managed Care – PPO | Admitting: Internal Medicine

## 2014-07-28 ENCOUNTER — Encounter: Payer: Self-pay | Admitting: Internal Medicine

## 2014-10-21 ENCOUNTER — Other Ambulatory Visit: Payer: Self-pay | Admitting: Internal Medicine

## 2014-10-21 NOTE — Telephone Encounter (Signed)
MD is out pls advise on refill.../lmb 

## 2014-10-23 ENCOUNTER — Ambulatory Visit: Payer: BC Managed Care – PPO | Admitting: Internal Medicine

## 2014-10-23 NOTE — Telephone Encounter (Signed)
Called pharmacy spoke with allison gave md approval.../lmb 

## 2014-12-22 ENCOUNTER — Ambulatory Visit (INDEPENDENT_AMBULATORY_CARE_PROVIDER_SITE_OTHER): Payer: 59 | Admitting: Internal Medicine

## 2014-12-22 ENCOUNTER — Encounter: Payer: Self-pay | Admitting: Internal Medicine

## 2014-12-22 VITALS — BP 122/76 | HR 54 | Temp 97.9°F | Ht 68.0 in | Wt 197.8 lb

## 2014-12-22 DIAGNOSIS — J301 Allergic rhinitis due to pollen: Secondary | ICD-10-CM

## 2014-12-22 DIAGNOSIS — F329 Major depressive disorder, single episode, unspecified: Secondary | ICD-10-CM

## 2014-12-22 DIAGNOSIS — F411 Generalized anxiety disorder: Secondary | ICD-10-CM

## 2014-12-22 DIAGNOSIS — F32A Depression, unspecified: Secondary | ICD-10-CM

## 2014-12-22 MED ORDER — NORETHINDRONE 0.35 MG PO TABS: 1.0000 | ORAL_TABLET | Freq: Every day | ORAL | Status: DC

## 2014-12-22 MED ORDER — DULOXETINE HCL 60 MG PO CPEP: 60.0000 mg | ORAL_CAPSULE | Freq: Every day | ORAL | Status: DC

## 2014-12-22 NOTE — Assessment & Plan Note (Signed)
Encouraged over-the-counter antihistamine and nasal steroid

## 2014-12-22 NOTE — Patient Instructions (Signed)
It was good to see you today.  We have reviewed your prior records including labs and tests today  Medications reviewed and updated, no changes recommended at this time.Refill on medication(s) as discussed today.  Please schedule followup in 9-18 months for annual exam and labs, call sooner if problems.

## 2014-12-22 NOTE — Progress Notes (Signed)
Subjective:    Patient ID: Erica [name], female    DOB: 03/05/1981, 34 y.o.   MRN: [id number]  HPI  Patient here for follow-up. Reviewed chronic issues and interval events  Past Medical History  Diagnosis Date  . Anxiety   . Depression   . Allergy     Review of Systems  Constitutional: Negative for fatigue and unexpected weight change.  Respiratory: Negative for cough and shortness of breath.   Cardiovascular: Negative for chest pain and leg swelling.  Psychiatric/Behavioral: Negative for suicidal ideas, sleep disturbance, self-injury, dysphoric mood and decreased concentration. The patient is not nervous/anxious.        Objective:    Physical Exam  Constitutional: She appears well-developed and well-nourished. No distress.  overweight  Cardiovascular: Normal rate, regular rhythm and normal heart sounds.   No murmur heard. Pulmonary/Chest: Effort normal and breath sounds normal. No respiratory distress.  Musculoskeletal: She exhibits no edema.  Psychiatric: She has a normal mood and affect. Her behavior is normal. Judgment and thought content normal.  Vitals reviewed.   BP 122/76 mmHg  Pulse 54  Temp(Src) 97.9 F (36.6 C) (Oral)  Ht 5\' 8"  (1.727 m)  Wt 197 lb 12 oz (89.699 kg)  BMI 30.07 kg/m2  SpO2 95%  LMP 12/08/2014 Wt Readings from Last 3 Encounters:  12/22/14 197 lb 12 oz (89.699 kg)  04/22/14 205 lb 8 oz (93.214 kg)  02/19/14 199 lb (90.266 kg)    Lab Results  Component Value Date   WBC 6.9 04/16/2014   HGB 12.8 04/16/2014   HCT 38.8 04/16/2014   PLT 303.0 04/16/2014   GLUCOSE 84 04/16/2014   CHOL 198 04/16/2014   TRIG 156.0* 04/16/2014   HDL 34.30* 04/16/2014   LDLDIRECT 154.6 01/25/2013   LDLCALC 133* 04/16/2014   ALT 18 04/16/2014   AST 23 04/16/2014   NA 139 04/16/2014   K 4.4 04/16/2014   CL 105 04/16/2014   CREATININE 0.8 04/16/2014   BUN 11 04/16/2014   CO2 30 04/16/2014   TSH 1.27 04/16/2014    No results found.       Assessment & Plan:   Problem List Items Addressed This Visit    Allergic rhinitis    Encouraged over-the-counter antihistamine and nasal steroid      Anxiety state    Chronic symptoms  Started sertraline 09/2010, dose increase 12/2010 - temporarily discontinued 12/2011 with pregnancy,then resumed tx 11/2012 per discussion with gyn Lavoie Titrated sertraline to max dose 01/2013, but due to persisting stressors and weight gain, changed sertraline to cymbalta 01/2014 Increased dose July 2015 and increase dose xanax 0.5 tid prn Currently symptoms stable, using Xanax once daily or less The current medical regimen is effective;  continue present plan and medications.       Relevant Medications   DULoxetine (CYMBALTA) DR capsule   ALPRAZolam Prudy Feeler(XANAX) tablet   Depression - Primary    Started sertraline 09/2010, dose increase 12/2010 - improved -  Off meds 12/2011 with pregnancy, then resumed tx 11/2012 (discussed with gyn Lavoie due to breast feeding status) Titrate to max dose 01/2013, but persisiting stressors and weight gain so changed SSRI to SNRI cymbalta summer 2015 Doing well, social situation/relationship stressors improved The current medical regimen is effective;  continue present plan and medications. Encouraged to continue counseling prn and call if problems arise prior to follow up       Relevant Medications   DULoxetine (CYMBALTA) DR capsule   ALPRAZolam Prudy Feeler(XANAX) tablet  Erica Grant, MD

## 2014-12-22 NOTE — Assessment & Plan Note (Signed)
Started sertraline 09/2010, dose increase 12/2010 - improved -  Off meds 12/2011 with pregnancy, then resumed tx 11/2012 (discussed with gyn Lavoie due to breast feeding status) Titrate to max dose 01/2013, but persisiting stressors and weight gain so changed SSRI to SNRI cymbalta summer 2015 Doing well, social situation/relationship stressors improved The current medical regimen is effective;  continue present plan and medications. Encouraged to continue counseling prn and call if problems arise prior to follow up

## 2014-12-22 NOTE — Progress Notes (Signed)
Pre visit review using our clinic review tool, if applicable. No additional management support is needed unless otherwise documented below in the visit note. 

## 2014-12-22 NOTE — Assessment & Plan Note (Signed)
Chronic symptoms  Started sertraline 09/2010, dose increase 12/2010 - temporarily discontinued 12/2011 with pregnancy,then resumed tx 11/2012 per discussion with gyn Lavoie Titrated sertraline to max dose 01/2013, but due to persisting stressors and weight gain, changed sertraline to cymbalta 01/2014 Increased dose July 2015 and increase dose xanax 0.5 tid prn Currently symptoms stable, using Xanax once daily or less The current medical regimen is effective;  continue present plan and medications.

## 2015-03-09 ENCOUNTER — Telehealth: Payer: Self-pay

## 2015-03-09 ENCOUNTER — Other Ambulatory Visit: Payer: Self-pay | Admitting: Internal Medicine

## 2015-03-09 NOTE — Telephone Encounter (Signed)
Request to change Camila tab to Micronor due to lower cost.   Is this change appropriate and/or approved?

## 2015-03-10 MED ORDER — NORETHINDRONE 0.35 MG PO TABS: 1.0000 | ORAL_TABLET | Freq: Every day | ORAL | Status: DC

## 2015-03-10 NOTE — Telephone Encounter (Signed)
Sent in

## 2015-03-12 NOTE — Telephone Encounter (Signed)
Called pharmacy spoke with alex gave md approval.../lmb 

## 2015-04-14 ENCOUNTER — Telehealth: Payer: Self-pay | Admitting: Internal Medicine

## 2015-04-14 NOTE — Telephone Encounter (Signed)
[facility] [address]

## 2015-04-15 MED ORDER — ALPRAZOLAM 0.5 MG PO TABS: 0.5000 mg | ORAL_TABLET | Freq: Three times a day (TID) | ORAL | Status: DC | PRN

## 2015-04-15 NOTE — Telephone Encounter (Signed)
Printed for PCP to sign 

## 2015-04-15 NOTE — Addendum Note (Signed)
Addended by: Tonye BecketAIRRIKIER, Drystan Reader M on: 04/15/2015 08:21 AM   Modules accepted: Orders

## 2015-05-11 ENCOUNTER — Telehealth: Payer: Self-pay | Admitting: Internal Medicine
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
## 2015-05-12 MED ORDER — ALPRAZOLAM 1 MG PO TABS: 1.0000 mg | ORAL_TABLET | Freq: Three times a day (TID) | ORAL | Status: DC | PRN

## 2015-05-12 NOTE — Telephone Encounter (Signed)
rx faxed

## 2015-05-12 NOTE — Telephone Encounter (Signed)
Okay to increase dose. Prescription printed and [facility]. Please let patient know same and to let us know if other problems occur

## 2015-05-19 ENCOUNTER — Other Ambulatory Visit: Payer: Self-pay | Admitting: *Deleted

## 2015-05-19 ENCOUNTER — Telehealth: Payer: Self-pay | Admitting: *Deleted

## 2015-05-19 MED ORDER — CYCLOBENZAPRINE HCL 5 MG PO TABS: ORAL_TABLET | ORAL | Status: DC

## 2015-05-19 NOTE — Telephone Encounter (Signed)
Left msg on triage stating needing a refill on her cylobenzaprine, but [facility]. [facility] [address]. Notified pt med has been sent to pharmacy...Raechel Chute

## 2015-06-10 ENCOUNTER — Other Ambulatory Visit: Payer: Self-pay | Admitting: Internal Medicine

## 2015-06-11 NOTE — Telephone Encounter (Signed)
Faxed script back to 

## 2015-06-11 NOTE — Telephone Encounter (Signed)
[facility]...Raechel Chute

## 2015-07-27 ENCOUNTER — Other Ambulatory Visit: Payer: Self-pay | Admitting: Internal Medicine

## 2015-07-29 NOTE — Telephone Encounter (Signed)
MD out of office pls advise on refill.../lmb 

## 2015-08-03 NOTE — Telephone Encounter (Signed)
Forwarding to Dr. Felicity CoyerLeschber since she is in the office. Is this ok to refill...Raechel Chute/lmb

## 2015-09-30 ENCOUNTER — Telehealth: Payer: Self-pay | Admitting: Internal Medicine

## 2015-09-30 MED ORDER — ALPRAZOLAM 0.5 MG PO TABS: 0.5000 mg | ORAL_TABLET | Freq: Three times a day (TID) | ORAL | Status: DC | PRN

## 2015-09-30 NOTE — Telephone Encounter (Signed)
Refill faxed to pharm requested.

## 2015-09-30 NOTE — Telephone Encounter (Signed)
Pt is requesting a refill for ALPRAZolam (XANAX) 0.5 MG tablet [[id number]. She had a physical with Dr. Felicity CoyerLeschber in March so I kept that same appt just changed to Dr. Lawerance BachBurns. The last prescription was written with a quantity of 90 and she states that will be enough to last her till that appointment. [facility] [address]

## 2015-09-30 NOTE — Telephone Encounter (Signed)
I will prescribe the medication.  I will discuss with her at her PE the medication and if we should continue it.

## 2015-09-30 NOTE — Telephone Encounter (Signed)
Please advise 

## 2015-12-23 ENCOUNTER — Encounter: Payer: 59 | Admitting: Internal Medicine

## 2015-12-23 ENCOUNTER — Encounter: Payer: Self-pay | Admitting: Internal Medicine

## 2015-12-23 ENCOUNTER — Ambulatory Visit (INDEPENDENT_AMBULATORY_CARE_PROVIDER_SITE_OTHER): Payer: Self-pay | Admitting: Internal Medicine

## 2015-12-23 VITALS — BP 124/86 | HR 58 | Temp 98.1°F | Resp 16 | Wt 198.0 lb

## 2015-12-23 DIAGNOSIS — F329 Major depressive disorder, single episode, unspecified: Secondary | ICD-10-CM

## 2015-12-23 DIAGNOSIS — F411 Generalized anxiety disorder: Secondary | ICD-10-CM

## 2015-12-23 DIAGNOSIS — F32A Depression, unspecified: Secondary | ICD-10-CM

## 2015-12-23 DIAGNOSIS — M62830 Muscle spasm of back: Secondary | ICD-10-CM | POA: Insufficient documentation

## 2015-12-23 MED ORDER — ALPRAZOLAM 0.5 MG PO TABS: 0.5000 mg | ORAL_TABLET | Freq: Three times a day (TID) | ORAL | Status: DC | PRN

## 2015-12-23 MED ORDER — CYCLOBENZAPRINE HCL 5 MG PO TABS: ORAL_TABLET | ORAL | Status: DC

## 2015-12-23 MED ORDER — DULOXETINE HCL 60 MG PO CPEP: 60.0000 mg | ORAL_CAPSULE | Freq: Every day | ORAL | Status: DC

## 2015-12-23 NOTE — Progress Notes (Signed)
Pre visit review using our clinic review tool, if applicable. No additional management support is needed unless otherwise documented below in the visit note. 

## 2015-12-23 NOTE — Assessment & Plan Note (Signed)
Clinical depression since a teenager Depression currently controlled with current dose of Cymbalta-continue current dose No homicidal or suicidal thoughts Seeing at therapist Follow-up annually, sooner if needed

## 2015-12-23 NOTE — Assessment & Plan Note (Signed)
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

## 2015-12-23 NOTE — Progress Notes (Signed)
Subjective:    Patient ID: Erica [name], female    DOB: Jul 25, 1981, 35 y.o.   MRN: [id number]  HPI She is here to establish with a new pcp.  She is a Tourist information centre manager.   She is separated from husband. He is currently living in the guest house property. 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 He is not helping out financially. They have 1 daughter, who is 3. She is under a lot of financial stress as well as emotional stress. She feels her depression overall is controlled. She does feel anxious at times.  Back spasm:  She gets intermittent back spasms.  She uses flexeril as needed.  It does not make her tired.  She is a Tourist information centre manager and does get some of the back spasms is related to that.  She also has shoulder pain.  She is a Tourist information centre manager and is constantly aggravating it.  She is currently seeing a sports specialist, but knows it may not get better as long as she continues to aggravate it.   Anxiety, depression:  She was diagnosed with clinical depression as a teenager.  She takes the Cymbalta daily.  She uses the xanax as needed - some days she uses it twice a day and some days she does not take it.  She does see a therapist.  She does meditate.  She has gained weight.  She is not eating healthy and sometimes does not eat.    Medications and allergies reviewed with patient and updated if appropriate.  Patient Active Problem List   Diagnosis Date Noted  . Anxiety state 11/02/2010  . Depression 11/02/2010  . Allergic rhinitis 11/02/2010  . GERD 11/02/2010    Current Outpatient Prescriptions on File Prior to Visit  Medication Sig Dispense Refill  . ALPRAZolam (XANAX) 0.5 MG tablet Take 1 tablet (0.5 mg total) by mouth 3 (three) times daily as needed for anxiety. 90 tablet 0  . cyclobenzaprine (FLEXERIL) 5 MG tablet TAKE 1 TABLET (5 MG TOTAL) BY MOUTH EVERY 8 (EIGHT) HOURS AS NEEDED FOR MUSCLE SPASMS. 30 tablet 1    . DULoxetine (CYMBALTA) 60 MG capsule Take 1 capsule (60 mg total) by mouth daily. 90 capsule 3  . ibuprofen (ADVIL,MOTRIN) 600 MG tablet Take 1 tablet (600 mg total) by mouth every 8 (eight) hours as needed for mild pain or moderate pain. 30 tablet 0  . norethindrone (MICRONOR,CAMILA,ERRIN) 0.35 MG tablet Take 1 tablet (0.35 mg total) by mouth daily. (Patient not taking: Reported on 12/23/2015) 1 Package 11   No current facility-administered medications on file prior to visit.    Past Medical History  Diagnosis Date  . Anxiety   . Depression   . Allergy     Past Surgical History  Procedure Laterality Date  . No past surgeries      Social History   Social History  . Marital Status: Married    Spouse Name: N/A  . Number of Children: N/A  . Years of Education: N/A   Social History Main Topics  . Smoking status: Former Games developer  . Smokeless tobacco: Not on file  . Alcohol Use: Yes     Comment: Social  . Drug Use: 1.00 per week    Special: Marijuana  . Sexual Activity: Not on file   Other Topics Concern  . Not on file   Social History Narrative   Married,  lives with spouse. Tourist information centre managerDance teacher.    Part time work at day care   [place] s/p [employer] for gradschool    Family History  Problem Relation Age of Onset  . Alcohol abuse Mother   . Alcohol abuse Other     Whole family  . Breast cancer Other   . Lung cancer Other     Review of Systems  Constitutional: Positive for appetite change (varies, binge eats). Negative for fever.  Respiratory: Negative for cough, shortness of breath and wheezing.   Cardiovascular: Positive for chest pain (with anxiety). Negative for palpitations and leg swelling.  Neurological: Positive for headaches (occasional). Negative for light-headedness.  Psychiatric/Behavioral: Positive for self-injury and dysphoric mood. Negative for suicidal ideas (no homicidal thoughts) and sleep disturbance. The patient is  nervous/anxious.        Objective:   Filed Vitals:   12/23/15 1010  BP: 124/86  Pulse: 58  Temp: 98.1 F (36.7 C)  Resp: 16   Filed Weights   12/23/15 1010  Weight: 198 lb (89.812 kg)   Body mass index is 30.11 kg/(m^2).   Physical Exam Constitutional: Appears well-developed and well-nourished. No distress.  Neck: Neck supple. No tracheal deviation present. No thyromegaly present.  No carotid bruit. No cervical adenopathy.   Cardiovascular: Normal rate, regular rhythm and normal heart sounds.   No murmur heard.  No edema Pulmonary/Chest: Effort normal and breath sounds normal. No respiratory distress. No wheezes.  Psych: crying intermitently, anxious      Assessment & Plan:   See Problem List for Assessment and Plan of chronic medical problems.    Follow-up as needed-at least once a year

## 2015-12-23 NOTE — Patient Instructions (Addendum)
Medications reviewed and updated.  No changes recommended at this time.  Your prescription(s) have been submitted to your pharmacy. Please take as directed and contact our office if you believe you are having problem(s) with the medication(s).    Health Maintenance, Female Adopting a healthy lifestyle and getting preventive care can go a long way to promote health and wellness. Talk with your health care provider about what schedule of regular examinations is right for you. This is a good chance for you to check in with your provider about disease prevention and staying healthy. In between checkups, there are plenty of things you can do on your own. Experts have done a lot of research about which lifestyle changes and preventive measures are most likely to keep you healthy. Ask your health care provider for more information. WEIGHT AND DIET  Eat a healthy diet  Be sure to include plenty of vegetables, fruits, low-fat dairy products, and lean protein.  Do not eat a lot of foods high in solid fats, added sugars, or salt.  Get regular exercise. This is one of the most important things you can do for your health.  Most adults should exercise for at least 150 minutes each week. The exercise should increase your heart rate and make you sweat (moderate-intensity exercise).  Most adults should also do strengthening exercises at least twice a week. This is in addition to the moderate-intensity exercise.  Maintain a healthy weight  Body mass index (BMI) is a measurement that can be used to identify possible weight problems. It estimates body fat based on height and weight. Your health care provider can help determine your BMI and help you achieve or maintain a healthy weight.  For females 74 years of age and older:   A BMI below 18.5 is considered underweight.  A BMI of 18.5 to 24.9 is normal.  A BMI of 25 to 29.9 is considered overweight.  A BMI of 30 and above is considered obese.   Watch levels of cholesterol and blood lipids  You should start having your blood tested for lipids and cholesterol at 37 years of age, then have this test every 5 years.  You may need to have your cholesterol levels checked more often if:  Your lipid or cholesterol levels are high.  You are older than 37 years of age.  You are at high risk for heart disease.  CANCER SCREENING   Lung Cancer  Lung cancer screening is recommended for adults 20-24 years old who are at high risk for lung cancer because of a history of smoking.  A yearly low-dose CT scan of the lungs is recommended for people who:  Currently smoke.  Have quit within the past 15 years.  Have at least a 30-pack-year history of smoking. A pack year is smoking an average of one pack of cigarettes a day for 1 year.  Yearly screening should continue until it has been 15 years since you quit.  Yearly screening should stop if you develop a health problem that would prevent you from having lung cancer treatment.  Breast Cancer  Practice breast self-awareness. This means understanding how your breasts normally appear and feel.  It also means doing regular breast self-exams. Let your health care provider know about any changes, no matter how small.  If you are in your 20s or 30s, you should have a clinical breast exam (CBE) by a health care provider every 1-3 years as part of a regular health exam.  If you are 40 or older, have a CBE every year. Also consider having a breast X-ray (mammogram) every year.  If you have a family history of breast cancer, talk to your health care provider about genetic screening.  If you are at high risk for breast cancer, talk to your health care provider about having an MRI and a mammogram every year.  Breast cancer gene (BRCA) assessment is recommended for women who have family members with BRCA-related cancers. BRCA-related cancers  include:  Breast.  Ovarian.  Tubal.  Peritoneal cancers.  Results of the assessment will determine the need for genetic counseling and BRCA1 and BRCA2 testing. Cervical Cancer Your health care provider may recommend that you be screened regularly for cancer of the pelvic organs (ovaries, uterus, and vagina). This screening involves a pelvic examination, including checking for microscopic changes to the surface of your cervix (Pap test). You may be encouraged to have this screening done every 3 years, beginning at age 63.  For women ages 13-65, health care providers may recommend pelvic exams and Pap testing every 3 years, or they may recommend the Pap and pelvic exam, combined with testing for human papilloma virus (HPV), every 5 years. Some types of HPV increase your risk of cervical cancer. Testing for HPV may also be done on women of any age with unclear Pap test results.  Other health care providers may not recommend any screening for nonpregnant women who are considered low risk for pelvic cancer and who do not have symptoms. Ask your health care provider if a screening pelvic exam is right for you.  If you have had past treatment for cervical cancer or a condition that could lead to cancer, you need Pap tests and screening for cancer for at least 20 years after your treatment. If Pap tests have been discontinued, your risk factors (such as having a new sexual partner) need to be reassessed to determine if screening should resume. Some women have medical problems that increase the chance of getting cervical cancer. In these cases, your health care provider may recommend more frequent screening and Pap tests. Colorectal Cancer  This type of cancer can be detected and often prevented.  Routine colorectal cancer screening usually begins at 37 years of age and continues through 37 years of age.  Your health care provider may recommend screening at an earlier age if you have risk factors for  colon cancer.  Your health care provider may also recommend using home test kits to check for hidden blood in the stool.  A small camera at the end of a tube can be used to examine your colon directly (sigmoidoscopy or colonoscopy). This is done to check for the earliest forms of colorectal cancer.  Routine screening usually begins at age 97.  Direct examination of the colon should be repeated every 5-10 years through 37 years of age. However, you may need to be screened more often if early forms of precancerous polyps or small growths are found. Skin Cancer  Check your skin from head to toe regularly.  Tell your health care provider about any new moles or changes in moles, especially if there is a change in a mole's shape or color.  Also tell your health care provider if you have a mole that is larger than the size of a pencil eraser.  Always use sunscreen. Apply sunscreen liberally and repeatedly throughout the day.  Protect yourself by wearing long sleeves, pants, a wide-brimmed hat, and sunglasses whenever you  are outside. HEART DISEASE, DIABETES, AND HIGH BLOOD PRESSURE   High blood pressure causes heart disease and increases the risk of stroke. High blood pressure is more likely to develop in:  People who have blood pressure in the high end of the normal range (130-139/85-89 mm Hg).  People who are overweight or obese.  People who are African American.  If you are 34-68 years of age, have your blood pressure checked every 3-5 years. If you are 37 years of age or older, have your blood pressure checked every year. You should have your blood pressure measured twice--once when you are at a hospital or clinic, and once when you are not at a hospital or clinic. Record the average of the two measurements. To check your blood pressure when you are not at a hospital or clinic, you can use:  An automated blood pressure machine at a pharmacy.  A home blood pressure monitor.  If you  are between 10 years and 63 years old, ask your health care provider if you should take aspirin to prevent strokes.  Have regular diabetes screenings. This involves taking a blood sample to check your fasting blood sugar level.  If you are at a normal weight and have a low risk for diabetes, have this test once every three years after 37 years of age.  If you are overweight and have a high risk for diabetes, consider being tested at a younger age or more often. PREVENTING INFECTION  Hepatitis B  If you have a higher risk for hepatitis B, you should be screened for this virus. You are considered at high risk for hepatitis B if:  You were born in a country where hepatitis B is common. Ask your health care provider which countries are considered high risk.  Your parents were born in a high-risk country, and you have not been immunized against hepatitis B (hepatitis B vaccine).  You have HIV or AIDS.  You use needles to inject street drugs.  You live with someone who has hepatitis B.  You have had sex with someone who has hepatitis B.  You get hemodialysis treatment.  You take certain medicines for conditions, including cancer, organ transplantation, and autoimmune conditions. Hepatitis C  Blood testing is recommended for:  Everyone born from [birth year] through 1967.  Anyone with known risk factors for hepatitis C. Sexually transmitted infections (STIs)  You should be screened for sexually transmitted infections (STIs) including gonorrhea and chlamydia if:  You are sexually active and are younger than 38 years of age.  You are older than 37 years of age and your health care provider tells you that you are at risk for this type of infection.  Your sexual activity has changed since you were last screened and you are at an increased risk for chlamydia or gonorrhea. Ask your health care provider if you are at risk.  If you do not have HIV, but are at risk, it may be recommended that you  take a prescription medicine daily to prevent HIV infection. This is called pre-exposure prophylaxis (PrEP). You are considered at risk if:  You are sexually active and do not regularly use condoms or know the HIV status of your partner(s).  You take drugs by injection.  You are sexually active with a partner who has HIV. Talk with your health care provider about whether you are at high risk of being infected with HIV. If you choose to begin PrEP, you should first be tested for  HIV. You should then be tested every 3 months for as long as you are taking PrEP.  PREGNANCY   If you are premenopausal and you may become pregnant, ask your health care provider about preconception counseling.  If you may become pregnant, take 400 to 800 micrograms (mcg) of folic acid every day.  If you want to prevent pregnancy, talk to your health care provider about birth control (contraception). OSTEOPOROSIS AND MENOPAUSE   Osteoporosis is a disease in which the bones lose minerals and strength with aging. This can result in serious bone fractures. Your risk for osteoporosis can be identified using a bone density scan.  If you are 49 years of age or older, or if you are at risk for osteoporosis and fractures, ask your health care provider if you should be screened.  Ask your health care provider whether you should take a calcium or vitamin D supplement to lower your risk for osteoporosis.  Menopause may have certain physical symptoms and risks.  Hormone replacement therapy may reduce some of these symptoms and risks. Talk to your health care provider about whether hormone replacement therapy is right for you.  HOME CARE INSTRUCTIONS   Schedule regular health, dental, and eye exams.  Stay current with your immunizations.   Do not use any tobacco products including cigarettes, chewing tobacco, or electronic cigarettes.  If you are pregnant, do not drink alcohol.  If you are breastfeeding, limit how  much and how often you drink alcohol.  Limit alcohol intake to no more than 1 drink per day for nonpregnant women. One drink equals 12 ounces of beer, 5 ounces of wine, or 1 ounces of hard liquor.  Do not use street drugs.  Do not share needles.  Ask your health care provider for help if you need support or information about quitting drugs.  Tell your health care provider if you often feel depressed.  Tell your health care provider if you have ever been abused or do not feel safe at home.   This information is not intended to replace advice given to you by your health care provider. Make sure you discuss any questions you have with your health care provider.   Document Released: 03/28/2011 Document Revised: 10/03/2014 Document Reviewed: 08/14/2013 Elsevier Interactive Patient Education [employer].

## 2015-12-23 NOTE — Assessment & Plan Note (Signed)
She has significant financial and emotional stress and anxiety Overall she feels she is dealing with everything well Continue current dose of Cymbalta She will continue to see her therapist She is meditating and doing other things to help control her anxiety She will continue to use the Xanax as needed-discussed concern for potential addiction and overuse Refills given today

## 2016-03-14 ENCOUNTER — Telehealth: Payer: Self-pay | Admitting: *Deleted

## 2016-03-14 ENCOUNTER — Other Ambulatory Visit: Payer: Self-pay | Admitting: Emergency Medicine

## 2016-03-14 MED ORDER — ALPRAZOLAM 0.5 MG PO TABS: 0.5000 mg | ORAL_TABLET | Freq: Three times a day (TID) | ORAL | Status: DC | PRN

## 2016-03-14 NOTE — Telephone Encounter (Signed)
Received fax pt requesting refill on Sharobel 0.35mg  take 1 by mouth once daily. Med not on med list pls advise on refill...Raechel Chute/lmb

## 2016-03-14 NOTE — Telephone Encounter (Signed)
That is birth control - she needs to get that from her gyn

## 2016-03-14 NOTE — Telephone Encounter (Signed)
Xanax rx faxed to [facility]

## 2016-03-15 NOTE — Telephone Encounter (Signed)
Pt informed and was not sure why pharmacy sent request to us.

## 2016-03-16 ENCOUNTER — Other Ambulatory Visit: Payer: Self-pay | Admitting: Internal Medicine

## 2016-05-23 ENCOUNTER — Other Ambulatory Visit: Payer: Self-pay | Admitting: Internal Medicine

## 2016-05-25 NOTE — Telephone Encounter (Signed)
Faxed script back to [facility]...lmb 

## 2016-07-25 ENCOUNTER — Other Ambulatory Visit: Payer: Self-pay | Admitting: Internal Medicine

## 2016-07-26 NOTE — Telephone Encounter (Signed)
RX faxed to [facility] 

## 2016-09-06 ENCOUNTER — Other Ambulatory Visit: Payer: Self-pay | Admitting: Internal Medicine

## 2016-09-07 NOTE — Telephone Encounter (Signed)
RX faxed to [facility] 

## 2016-10-27 ENCOUNTER — Other Ambulatory Visit: Payer: Self-pay | Admitting: Internal Medicine

## 2016-11-09 ENCOUNTER — Ambulatory Visit: Payer: Self-pay | Admitting: Psychology

## 2016-12-11 ENCOUNTER — Other Ambulatory Visit: Payer: Self-pay | Admitting: Internal Medicine

## 2016-12-12 NOTE — Telephone Encounter (Signed)
RX faxed to [facility] 

## 2016-12-15 ENCOUNTER — Ambulatory Visit (INDEPENDENT_AMBULATORY_CARE_PROVIDER_SITE_OTHER): Payer: Self-pay | Admitting: Psychology

## 2016-12-15 DIAGNOSIS — F320 Major depressive disorder, single episode, mild: Secondary | ICD-10-CM

## 2017-01-12 ENCOUNTER — Ambulatory Visit (INDEPENDENT_AMBULATORY_CARE_PROVIDER_SITE_OTHER): Payer: Self-pay | Admitting: Psychology

## 2017-01-12 ENCOUNTER — Other Ambulatory Visit: Payer: Self-pay | Admitting: Internal Medicine

## 2017-01-12 DIAGNOSIS — F320 Major depressive disorder, single episode, mild: Secondary | ICD-10-CM

## 2017-01-23 ENCOUNTER — Other Ambulatory Visit: Payer: Self-pay | Admitting: Internal Medicine

## 2017-01-30 ENCOUNTER — Other Ambulatory Visit: Payer: Self-pay | Admitting: Internal Medicine

## 2017-02-07 ENCOUNTER — Ambulatory Visit (INDEPENDENT_AMBULATORY_CARE_PROVIDER_SITE_OTHER): Payer: Self-pay | Admitting: Psychology

## 2017-02-07 DIAGNOSIS — F320 Major depressive disorder, single episode, mild: Secondary | ICD-10-CM

## 2017-02-10 ENCOUNTER — Telehealth: Payer: Self-pay | Admitting: *Deleted

## 2017-02-10 MED ORDER — ALPRAZOLAM 0.5 MG PO TABS: 0.5000 mg | ORAL_TABLET | Freq: Three times a day (TID) | ORAL | 0 refills | Status: DC | PRN

## 2017-02-10 NOTE — Telephone Encounter (Signed)
Called pt to verify which pharmacy she is using. Inform will call refill into [facility]. Called pharmacy had to leave on pharmacy vm...Raechel Chute/lmb

## 2017-02-10 NOTE — Telephone Encounter (Signed)
Pt left msg on triage stating she has made an appt for 7/11. Requesting refill on her alprazolam enough until her appt...Raechel Chute/lmb

## 2017-02-10 NOTE — Telephone Encounter (Signed)
Ok to refill until appt 

## 2017-03-28 ENCOUNTER — Ambulatory Visit (INDEPENDENT_AMBULATORY_CARE_PROVIDER_SITE_OTHER): Payer: Self-pay | Admitting: Psychology

## 2017-03-28 DIAGNOSIS — F320 Major depressive disorder, single episode, mild: Secondary | ICD-10-CM

## 2017-04-05 ENCOUNTER — Ambulatory Visit (INDEPENDENT_AMBULATORY_CARE_PROVIDER_SITE_OTHER): Payer: Self-pay | Admitting: Internal Medicine

## 2017-04-05 ENCOUNTER — Encounter: Payer: Self-pay | Admitting: Internal Medicine

## 2017-04-05 ENCOUNTER — Other Ambulatory Visit: Payer: Self-pay | Admitting: Internal Medicine

## 2017-04-05 VITALS — BP 124/82 | HR 73 | Temp 98.3°F | Resp 16 | Ht 68.0 in | Wt 197.0 lb

## 2017-04-05 DIAGNOSIS — F411 Generalized anxiety disorder: Secondary | ICD-10-CM

## 2017-04-05 DIAGNOSIS — Z0000 Encounter for general adult medical examination without abnormal findings: Secondary | ICD-10-CM

## 2017-04-05 DIAGNOSIS — M62830 Muscle spasm of back: Secondary | ICD-10-CM

## 2017-04-05 DIAGNOSIS — F3289 Other specified depressive episodes: Secondary | ICD-10-CM

## 2017-04-05 MED ORDER — ALPRAZOLAM 0.5 MG PO TABS: 0.5000 mg | ORAL_TABLET | Freq: Three times a day (TID) | ORAL | 5 refills | Status: DC | PRN

## 2017-04-05 MED ORDER — LEVONORGESTREL 20 MCG/24HR IU IUD: 1.0000 | INTRAUTERINE_SYSTEM | Freq: Once | INTRAUTERINE | 0 refills | Status: DC

## 2017-04-05 MED ORDER — DULOXETINE HCL 60 MG PO CPEP: 60.0000 mg | ORAL_CAPSULE | Freq: Every day | ORAL | 3 refills | Status: DC

## 2017-04-05 MED ORDER — METHOCARBAMOL 500 MG PO TABS: 500.0000 mg | ORAL_TABLET | Freq: Three times a day (TID) | ORAL | 5 refills | Status: DC | PRN

## 2017-04-05 NOTE — Patient Instructions (Addendum)
All other Health Maintenance issues reviewed.   All recommended immunizations and age-appropriate screenings are up-to-date or discussed.  No immunizations administered today.   Medications reviewed and updated.  Changes include trying a different muscle relaxer - methocarbamol.  Your prescription(s) have been submitted to your pharmacy. Please take as directed and contact our office if you believe you are having problem(s) with the medication(s).   Please followup in one year   Health Maintenance, Female Adopting a healthy lifestyle and getting preventive care can go a long way to promote health and wellness. Talk with your health care provider about what schedule of regular examinations is right for you. This is a good chance for you to check in with your provider about disease prevention and staying healthy. In between checkups, there are plenty of things you can do on your own. Experts have done a lot of research about which lifestyle changes and preventive measures are most likely to keep you healthy. Ask your health care provider for more information. Weight and diet Eat a healthy diet  Be sure to include plenty of vegetables, fruits, low-fat dairy products, and lean protein.  Do not eat a lot of foods high in solid fats, added sugars, or salt.  Get regular exercise. This is one of the most important things you can do for your health. ? Most adults should exercise for at least 150 minutes each week. The exercise should increase your heart rate and make you sweat (moderate-intensity exercise). ? Most adults should also do strengthening exercises at least twice a week. This is in addition to the moderate-intensity exercise.  Maintain a healthy weight  Body mass index (BMI) is a measurement that can be used to identify possible weight problems. It estimates body fat based on height and weight. Your health care provider can help determine your BMI and help you achieve or maintain a  healthy weight.  For females 66 years of age and older: ? A BMI below 18.5 is considered underweight. ? A BMI of 18.5 to 24.9 is normal. ? A BMI of 25 to 29.9 is considered overweight. ? A BMI of 30 and above is considered obese.  Watch levels of cholesterol and blood lipids  You should start having your blood tested for lipids and cholesterol at 38 years of age, then have this test every 5 years.  You may need to have your cholesterol levels checked more often if: ? Your lipid or cholesterol levels are high. ? You are older than 38 years of age. ? You are at high risk for heart disease.  Cancer screening Lung Cancer  Lung cancer screening is recommended for adults 79-34 years old who are at high risk for lung cancer because of a history of smoking.  A yearly low-dose CT scan of the lungs is recommended for people who: ? Currently smoke. ? Have quit within the past 15 years. ? Have at least a 30-pack-year history of smoking. A pack year is smoking an average of one pack of cigarettes a day for 1 year.  Yearly screening should continue until it has been 15 years since you quit.  Yearly screening should stop if you develop a health problem that would prevent you from having lung cancer treatment.  Breast Cancer  Practice breast self-awareness. This means understanding how your breasts normally appear and feel.  It also means doing regular breast self-exams. Let your health care provider know about any changes, no matter how small.  If you are  in your 76s or 30s, you should have a clinical breast exam (CBE) by a health care provider every 1-3 years as part of a regular health exam.  If you are 75 or older, have a CBE every year. Also consider having a breast X-ray (mammogram) every year.  If you have a family history of breast cancer, talk to your health care provider about genetic screening.  If you are at high risk for breast cancer, talk to your health care provider about  having an MRI and a mammogram every year.  Breast cancer gene (BRCA) assessment is recommended for women who have family members with BRCA-related cancers. BRCA-related cancers include: ? Breast. ? Ovarian. ? Tubal. ? Peritoneal cancers.  Results of the assessment will determine the need for genetic counseling and BRCA1 and BRCA2 testing.  Cervical Cancer Your health care provider may recommend that you be screened regularly for cancer of the pelvic organs (ovaries, uterus, and vagina). This screening involves a pelvic examination, including checking for microscopic changes to the surface of your cervix (Pap test). You may be encouraged to have this screening done every 3 years, beginning at age 28.  For women ages 72-65, health care providers may recommend pelvic exams and Pap testing every 3 years, or they may recommend the Pap and pelvic exam, combined with testing for human papilloma virus (HPV), every 5 years. Some types of HPV increase your risk of cervical cancer. Testing for HPV may also be done on women of any age with unclear Pap test results.  Other health care providers may not recommend any screening for nonpregnant women who are considered low risk for pelvic cancer and who do not have symptoms. Ask your health care provider if a screening pelvic exam is right for you.  If you have had past treatment for cervical cancer or a condition that could lead to cancer, you need Pap tests and screening for cancer for at least 20 years after your treatment. If Pap tests have been discontinued, your risk factors (such as having a new sexual partner) need to be reassessed to determine if screening should resume. Some women have medical problems that increase the chance of getting cervical cancer. In these cases, your health care provider may recommend more frequent screening and Pap tests.  Colorectal Cancer  This type of cancer can be detected and often prevented.  Routine colorectal cancer  screening usually begins at 38 years of age and continues through 38 years of age.  Your health care provider may recommend screening at an earlier age if you have risk factors for colon cancer.  Your health care provider may also recommend using home test kits to check for hidden blood in the stool.  A small camera at the end of a tube can be used to examine your colon directly (sigmoidoscopy or colonoscopy). This is done to check for the earliest forms of colorectal cancer.  Routine screening usually begins at age 41.  Direct examination of the colon should be repeated every 5-10 years through 38 years of age. However, you may need to be screened more often if early forms of precancerous polyps or small growths are found.  Skin Cancer  Check your skin from head to toe regularly.  Tell your health care provider about any new moles or changes in moles, especially if there is a change in a mole's shape or color.  Also tell your health care provider if you have a mole that is larger than the  size of a pencil eraser.  Always use sunscreen. Apply sunscreen liberally and repeatedly throughout the day.  Protect yourself by wearing long sleeves, pants, a wide-brimmed hat, and sunglasses whenever you are outside.  Heart disease, diabetes, and high blood pressure  High blood pressure causes heart disease and increases the risk of stroke. High blood pressure is more likely to develop in: ? People who have blood pressure in the high end of the normal range (130-139/85-89 mm Hg). ? People who are overweight or obese. ? People who are African American.  If you are 75-19 years of age, have your blood pressure checked every 3-5 years. If you are 44 years of age or older, have your blood pressure checked every year. You should have your blood pressure measured twice-once when you are at a hospital or clinic, and once when you are not at a hospital or clinic. Record the average of the two measurements.  To check your blood pressure when you are not at a hospital or clinic, you can use: ? An automated blood pressure machine at a pharmacy. ? A home blood pressure monitor.  If you are between 110 years and 45 years old, ask your health care provider if you should take aspirin to prevent strokes.  Have regular diabetes screenings. This involves taking a blood sample to check your fasting blood sugar level. ? If you are at a normal weight and have a low risk for diabetes, have this test once every three years after 38 years of age. ? If you are overweight and have a high risk for diabetes, consider being tested at a younger age or more often. Preventing infection Hepatitis B  If you have a higher risk for hepatitis B, you should be screened for this virus. You are considered at high risk for hepatitis B if: ? You were born in a country where hepatitis B is common. Ask your health care provider which countries are considered high risk. ? Your parents were born in a high-risk country, and you have not been immunized against hepatitis B (hepatitis B vaccine). ? You have HIV or AIDS. ? You use needles to inject street drugs. ? You live with someone who has hepatitis B. ? You have had sex with someone who has hepatitis B. ? You get hemodialysis treatment. ? You take certain medicines for conditions, including cancer, organ transplantation, and autoimmune conditions.  Hepatitis C  Blood testing is recommended for: ? Everyone born from [birth year] through 1967. ? Anyone with known risk factors for hepatitis C.  Sexually transmitted infections (STIs)  You should be screened for sexually transmitted infections (STIs) including gonorrhea and chlamydia if: ? You are sexually active and are younger than 38 years of age. ? You are older than 38 years of age and your health care provider tells you that you are at risk for this type of infection. ? Your sexual activity has changed since you were last screened  and you are at an increased risk for chlamydia or gonorrhea. Ask your health care provider if you are at risk.  If you do not have HIV, but are at risk, it may be recommended that you take a prescription medicine daily to prevent HIV infection. This is called pre-exposure prophylaxis (PrEP). You are considered at risk if: ? You are sexually active and do not regularly use condoms or know the HIV status of your partner(s). ? You take drugs by injection. ? You are sexually active with a partner  who has HIV.  Talk with your health care provider about whether you are at high risk of being infected with HIV. If you choose to begin PrEP, you should first be tested for HIV. You should then be tested every 3 months for as long as you are taking PrEP. Pregnancy  If you are premenopausal and you may become pregnant, ask your health care provider about preconception counseling.  If you may become pregnant, take 400 to 800 micrograms (mcg) of folic acid every day.  If you want to prevent pregnancy, talk to your health care provider about birth control (contraception). Osteoporosis and menopause  Osteoporosis is a disease in which the bones lose minerals and strength with aging. This can result in serious bone fractures. Your risk for osteoporosis can be identified using a bone density scan.  If you are 51 years of age or older, or if you are at risk for osteoporosis and fractures, ask your health care provider if you should be screened.  Ask your health care provider whether you should take a calcium or vitamin D supplement to lower your risk for osteoporosis.  Menopause may have certain physical symptoms and risks.  Hormone replacement therapy may reduce some of these symptoms and risks. Talk to your health care provider about whether hormone replacement therapy is right for you. Follow these instructions at home:  Schedule regular health, dental, and eye exams.  Stay current with your  immunizations.  Do not use any tobacco products including cigarettes, chewing tobacco, or electronic cigarettes.  If you are pregnant, do not drink alcohol.  If you are breastfeeding, limit how much and how often you drink alcohol.  Limit alcohol intake to no more than 1 drink per day for nonpregnant women. One drink equals 12 ounces of beer, 5 ounces of wine, or 1 ounces of hard liquor.  Do not use street drugs.  Do not share needles.  Ask your health care provider for help if you need support or information about quitting drugs.  Tell your health care provider if you often feel depressed.  Tell your health care provider if you have ever been abused or do not feel safe at home. This information is not intended to replace advice given to you by your health care provider. Make sure you discuss any questions you have with your health care provider. Document Released: 03/28/2011 Document Revised: 02/18/2016 Document Reviewed: 06/16/2015 Elsevier Interactive Patient Education  [employer].

## 2017-04-05 NOTE — Progress Notes (Signed)
Subjective:    Patient ID: Erica [name], female    DOB: 08-Mar-1981, 36 y.o.   MRN: [id number]  HPI She is here for a physical exam.   Anxiety: She is taking her medication daily as prescribed. She denies any side effects from the medication. She feels her anxiety is well controlled and she is happy with her current dose of medication.   Depression: She is taking her medication daily as prescribed. She denies any side effects from the medication. She feels her depression is well controlled and she is happy with her current dose of medication.   She is still going through a divorce and it may be complete over the next few months.  She sees a therapist every 3-4 weeks.  She is working multiple jobs.  She sees a [facility] for physical aliments.   Last week she felt like she was getting a sinus infection.  She still has a lingering cough.   Medications and allergies reviewed with patient and updated if appropriate.  Patient Active Problem List   Diagnosis Date Noted  . Back spasm 12/23/2015  . Anxiety state 11/02/2010  . Depression 11/02/2010  . Allergic rhinitis 11/02/2010    Current Outpatient Prescriptions on File Prior to Visit  Medication Sig Dispense Refill  . ALPRAZolam (XANAX) 0.5 MG tablet Take 1 tablet (0.5 mg total) by mouth 3 (three) times daily as needed for anxiety. --- Office visit needed for further refills 90 tablet 0  . DULoxetine (CYMBALTA) 60 MG capsule Take 1 capsule (60 mg total) by mouth daily. -- Office visit needed for further refills 90 capsule 0   No current facility-administered medications on file prior to visit.     Past Medical History:  Diagnosis Date  . Allergy   . Anxiety   . Depression     Past Surgical History:  Procedure Laterality Date  . NO PAST SURGERIES      Social History   Social History  . Marital status: Married    Spouse name: N/A  . Number of children: N/A  . Years of education: N/A   Social History Main Topics  .  Smoking status: Former Games developer  . Smokeless tobacco: Never Used  . Alcohol use Yes     Comment: Social  . Drug use: Yes    Frequency: 1.0 time per week    Types: Marijuana  . Sexual activity: Not Asked   Other Topics Concern  . 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    Part time work at day care   Originally from [place], [place], s/p [employer] for gradschool    Family History  Problem Relation Age of Onset  . Alcohol abuse Mother   . Alcohol abuse Other        Whole family  . Breast cancer Other   . Lung cancer Other     Review of Systems  Constitutional: Positive for appetite change (variable with stress/anxiety). Negative for chills and fever.  HENT: Negative for congestion, ear pain, sinus pain and sore throat.   Respiratory: Positive for cough (lingering cough from URI last week). Negative for shortness of breath and wheezing.   Cardiovascular: Negative for chest pain, palpitations and leg swelling.  Gastrointestinal: Negative for abdominal pain, blood in stool, constipation and diarrhea (occ with anxiety).  Genitourinary: Negative for dysuria and hematuria.  Musculoskeletal: Positive for back pain.  Skin: Positive for color change (on left cheek). Negative  for rash.  Neurological: Negative for light-headedness and headaches.  Psychiatric/Behavioral: Positive for dysphoric mood. The patient is nervous/anxious (fairly controlled).        Objective:   Vitals:   04/05/17 0914  BP: 124/82  Pulse: 73  Resp: 16  Temp: 98.3 F (36.8 C)   Filed Weights   04/05/17 0914  Weight: 197 lb (89.4 kg)   Body mass index is 29.95 kg/m.  Wt Readings from Last 3 Encounters:  04/05/17 197 lb (89.4 kg)  12/23/15 198 lb (89.8 kg)  12/22/14 197 lb 12 oz (89.7 kg)     Physical Exam Constitutional: She appears well-developed and well-nourished. No distress.  HENT:  Head: Normocephalic and atraumatic.  Right Ear: External ear  normal. Normal ear canal and TM Left Ear: External ear normal.  Normal ear canal and TM Mouth/Throat: Oropharynx is clear and moist.  Eyes: Conjunctivae and EOM are normal.  Neck: Neck supple. No tracheal deviation present. No thyromegaly present.  No carotid bruit  Cardiovascular: Normal rate, regular rhythm and normal heart sounds.   No murmur heard.  No edema. Pulmonary/Chest: Effort normal and breath sounds normal. No respiratory distress. She has no wheezes. She has no rales.  Breast: deferred to Gyn Abdominal: Soft. She exhibits no distension. There is no tenderness.  Lymphadenopathy: She has no cervical adenopathy.  Skin: Skin is warm and dry. She is not diaphoretic.  Psychiatric: She has a normal mood and affect. Her behavior is normal.         Assessment & Plan:   Physical exam: Screening blood work ordered Immunizations - discussed tetanus - will look into getting it at a local pharmacy Gyn  Up to date  Exercise Weight - has lost some weight - encouraged to continue Skin  No concerns Substance abuse  none  See Problem List for Assessment and Plan of chronic medical problems.

## 2017-04-05 NOTE — Assessment & Plan Note (Signed)
Still high stress, but she feels it is overall controlled with current medication Continue xanax prn and cymbalta daily Seeing a therapist regularly F/u in one year

## 2017-04-05 NOTE — Assessment & Plan Note (Signed)
Controlled, stable Continue current dose of medication  

## 2017-04-05 NOTE — Assessment & Plan Note (Signed)
Chronic, intermittent Does see a chiropractor She is dance a teacher Flexeril is too strong - will try methocarbamol

## 2017-04-05 NOTE — Telephone Encounter (Signed)
RX faxed to [facility] 

## 2017-04-26 ENCOUNTER — Ambulatory Visit (INDEPENDENT_AMBULATORY_CARE_PROVIDER_SITE_OTHER): Payer: Self-pay | Admitting: Psychology

## 2017-04-26 DIAGNOSIS — F320 Major depressive disorder, single episode, mild: Secondary | ICD-10-CM

## 2017-05-08 ENCOUNTER — Ambulatory Visit (INDEPENDENT_AMBULATORY_CARE_PROVIDER_SITE_OTHER): Payer: Self-pay | Admitting: Psychology

## 2017-05-08 DIAGNOSIS — F320 Major depressive disorder, single episode, mild: Secondary | ICD-10-CM

## 2017-05-26 ENCOUNTER — Ambulatory Visit (INDEPENDENT_AMBULATORY_CARE_PROVIDER_SITE_OTHER): Payer: Self-pay | Admitting: Psychology

## 2017-05-26 DIAGNOSIS — F320 Major depressive disorder, single episode, mild: Secondary | ICD-10-CM

## 2017-06-23 ENCOUNTER — Ambulatory Visit (INDEPENDENT_AMBULATORY_CARE_PROVIDER_SITE_OTHER): Payer: Self-pay | Admitting: Psychology

## 2017-06-23 DIAGNOSIS — F320 Major depressive disorder, single episode, mild: Secondary | ICD-10-CM

## 2017-07-18 ENCOUNTER — Other Ambulatory Visit: Payer: Self-pay | Admitting: Internal Medicine

## 2017-07-18 NOTE — Telephone Encounter (Signed)
[place] Controlled Substance Database checked. Last filled on   06/16/17

## 2017-07-19 NOTE — Telephone Encounter (Signed)
RX faxed to [facility] 

## 2017-07-24 ENCOUNTER — Ambulatory Visit: Payer: Self-pay | Admitting: Psychology

## 2017-07-26 ENCOUNTER — Ambulatory Visit (INDEPENDENT_AMBULATORY_CARE_PROVIDER_SITE_OTHER): Payer: Self-pay | Admitting: Psychology

## 2017-07-26 DIAGNOSIS — F320 Major depressive disorder, single episode, mild: Secondary | ICD-10-CM

## 2017-09-06 ENCOUNTER — Ambulatory Visit (INDEPENDENT_AMBULATORY_CARE_PROVIDER_SITE_OTHER): Payer: Self-pay | Admitting: Psychology

## 2017-09-06 DIAGNOSIS — F320 Major depressive disorder, single episode, mild: Secondary | ICD-10-CM

## 2017-10-18 ENCOUNTER — Ambulatory Visit (INDEPENDENT_AMBULATORY_CARE_PROVIDER_SITE_OTHER): Payer: Self-pay | Admitting: Psychology

## 2017-10-18 DIAGNOSIS — F320 Major depressive disorder, single episode, mild: Secondary | ICD-10-CM

## 2017-10-23 ENCOUNTER — Other Ambulatory Visit: Payer: Self-pay | Admitting: Internal Medicine

## 2017-10-23 NOTE — Telephone Encounter (Signed)
[place] Controlled Substance Database checked. Last filled on 09/25/17 

## 2017-12-04 ENCOUNTER — Ambulatory Visit (INDEPENDENT_AMBULATORY_CARE_PROVIDER_SITE_OTHER): Payer: Self-pay | Admitting: Psychology

## 2017-12-04 DIAGNOSIS — F320 Major depressive disorder, single episode, mild: Secondary | ICD-10-CM

## 2018-01-08 ENCOUNTER — Other Ambulatory Visit: Payer: Self-pay | Admitting: Internal Medicine

## 2018-01-17 ENCOUNTER — Ambulatory Visit (INDEPENDENT_AMBULATORY_CARE_PROVIDER_SITE_OTHER): Payer: Self-pay | Admitting: Psychology

## 2018-01-17 DIAGNOSIS — F320 Major depressive disorder, single episode, mild: Secondary | ICD-10-CM

## 2018-02-12 ENCOUNTER — Other Ambulatory Visit: Payer: Self-pay | Admitting: Internal Medicine

## 2018-02-12 NOTE — Telephone Encounter (Signed)
[place] Controlled Substance Database checked. Last filled on 01/09/18 

## 2018-03-08 ENCOUNTER — Ambulatory Visit: Payer: Self-pay | Admitting: Psychology

## 2018-03-12 ENCOUNTER — Ambulatory Visit (INDEPENDENT_AMBULATORY_CARE_PROVIDER_SITE_OTHER): Payer: Self-pay | Admitting: Psychology

## 2018-03-12 DIAGNOSIS — F411 Generalized anxiety disorder: Secondary | ICD-10-CM

## 2018-03-19 ENCOUNTER — Other Ambulatory Visit: Payer: Self-pay | Admitting: Internal Medicine

## 2018-03-21 NOTE — Progress Notes (Signed)
Subjective:    Patient ID: Erica [name], female    DOB: 16-May-1981, 37 y.o.   MRN: [id number]  HPI The patient is here for follow up.  She is still seeing her therapist.    Depression: She is taking her medication daily as prescribed. She denies any side effects from the medication. She feels her depression is well controlled and she is happy with her current dose of medication.   Anxiety: She is taking the Cymbalta daily as prescribed. She denies any side effects from the medication. She feels her anxiety is better with the cymbalta.  Some days it feels controlled other days it does not.  The xanax helps and she does not take it regularly, but occasionally will take two instead of one.  She wants to be able to control her anxiety on her own ideally.     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  She also sees a [facility].  She is having some mild lower back pain today.       Medications and allergies reviewed with patient and updated if appropriate.  Patient Active Problem List   Diagnosis Date Noted  . Back spasm 12/23/2015  . Anxiety state 11/02/2010  . Depression 11/02/2010  . Allergic rhinitis 11/02/2010    Current Outpatient Medications on File Prior to Visit  Medication Sig Dispense Refill  . DULoxetine (CYMBALTA) 60 MG capsule Take 1 capsule (60 mg total) by mouth daily. 90 capsule 3  . methocarbamol (ROBAXIN) 500 MG tablet TAKE 1 TABLET BY MOUTH EVERY 8 HOURS IF NEEDED FOR MUSCLE SPASM 30 tablet 0  . levonorgestrel (MIRENA, 52 MG,) 20 MCG/24HR IUD 1 Intra Uterine Device (1 each total) by Intrauterine route once. 1 each 0   No current facility-administered medications on file prior to visit.     Past Medical History:  Diagnosis Date  . Allergy   . Anxiety   . Depression     Past Surgical History:  Procedure Laterality Date  . NO PAST SURGERIES      Social History    Socioeconomic History  . Marital status: Married    Spouse name: Not on file  . Number of children: Not on file  . Years of education: Not on file  . Highest education level: Not on file  Occupational History  . Not on file  Social Needs  . Financial resource strain: Not on file  . Food insecurity:    Worry: Not on file    Inability: Not on file  . Transportation needs:    Medical: Not on file    Non-medical: Not on file  Tobacco Use  . Smoking status: Former Games developer  . Smokeless tobacco: Never Used  Substance and Sexual Activity  . Alcohol use: Yes    Comment: Social  . Drug use: Yes    Frequency: 1.0 times per week    Types: Marijuana  . Sexual activity: Not on file  Lifestyle  . Physical activity:    Days per week: Not on file    Minutes per session: Not on file  . Stress: Not on file  Relationships  . Social connections:    Talks on phone: Not on file    Gets together: Not on file    Attends religious service: Not on file    Active member of club or organization: Not on file    Attends  meetings of clubs or organizations: Not on file    Relationship status: Not on file  Other Topics Concern  . 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    Part time work at day care   [place] s/p [employer] for gradschool    Family History  Problem Relation Age of Onset  . Alcohol abuse Mother   . Alcohol abuse Other        Whole family  . Breast cancer Other   . Lung cancer Other     Review of Systems  Constitutional: Negative for chills and fever.  Respiratory: Positive for cough (from smoking). Negative for shortness of breath and wheezing.   Cardiovascular: Negative for chest pain, palpitations and leg swelling.  Musculoskeletal: Positive for back pain (intermittent).  Neurological: Positive for headaches. Negative for light-headedness.  Psychiatric/Behavioral: Positive for dysphoric mood  (controlled). The patient is nervous/anxious (fairly controlled).        Objective:   Vitals:   03/22/18 0951  BP: 108/80  Pulse: 68  Resp: 16  Temp: 98.7 F (37.1 C)  SpO2: 96%   BP Readings from Last 3 Encounters:  03/22/18 108/80  04/05/17 124/82  12/23/15 124/86   Wt Readings from Last 3 Encounters:  03/22/18 194 lb (88 kg)  04/05/17 197 lb (89.4 kg)  12/23/15 198 lb (89.8 kg)   Body mass index is 29.5 kg/m.   Physical Exam    Constitutional: Appears well-developed and well-nourished. No distress.  HENT:  Head: Normocephalic and atraumatic.  Neck: Neck supple. No tracheal deviation present. No thyromegaly present.  No cervical lymphadenopathy Cardiovascular: Normal rate, regular rhythm and normal heart sounds.   No murmur heard. No carotid bruit .  No edema Pulmonary/Chest: Effort normal and breath sounds normal. No respiratory distress. No has no wheezes. No rales.  Skin: Skin is warm and dry. Not diaphoretic.  Psychiatric: Normal mood and affect. Behavior is normal. Normal thinking.      Assessment & Plan:    See Problem List for Assessment and Plan of chronic medical problems.

## 2018-03-22 ENCOUNTER — Encounter: Payer: Self-pay | Admitting: Internal Medicine

## 2018-03-22 ENCOUNTER — Ambulatory Visit (INDEPENDENT_AMBULATORY_CARE_PROVIDER_SITE_OTHER): Payer: Self-pay | Admitting: Internal Medicine

## 2018-03-22 VITALS — BP 108/80 | HR 68 | Temp 98.7°F | Resp 16 | Wt 194.0 lb

## 2018-03-22 DIAGNOSIS — F411 Generalized anxiety disorder: Secondary | ICD-10-CM

## 2018-03-22 DIAGNOSIS — M62830 Muscle spasm of back: Secondary | ICD-10-CM

## 2018-03-22 DIAGNOSIS — F3289 Other specified depressive episodes: Secondary | ICD-10-CM

## 2018-03-22 MED ORDER — ALPRAZOLAM 0.5 MG PO TABS: 0.5000 mg | ORAL_TABLET | Freq: Three times a day (TID) | ORAL | 1 refills | Status: DC | PRN

## 2018-03-22 NOTE — Patient Instructions (Addendum)
  Medications reviewed and updated.  No changes recommended at this time.  Your prescription(s) have been submitted to your pharmacy. Please take as directed and contact our office if you believe you are having problem(s) with the medication(s).    Please followup in 6 months   

## 2018-03-22 NOTE — Assessment & Plan Note (Signed)
Controlled, stable Continue current dose of medication Seeing a therapist  

## 2018-03-22 NOTE — Assessment & Plan Note (Signed)
Controlled, stable Continue current dose of cymbalta Continue xanax as needed -ideally do not take regularly Seeing a therapist

## 2018-03-22 NOTE — Assessment & Plan Note (Signed)
Taking methocarbamol as needed Sees a chiropractor Doing yoga

## 2018-03-26 ENCOUNTER — Other Ambulatory Visit: Payer: Self-pay | Admitting: Internal Medicine

## 2018-04-02 ENCOUNTER — Ambulatory Visit (INDEPENDENT_AMBULATORY_CARE_PROVIDER_SITE_OTHER): Payer: Self-pay | Admitting: Psychology

## 2018-04-02 DIAGNOSIS — F411 Generalized anxiety disorder: Secondary | ICD-10-CM

## 2018-04-14 ENCOUNTER — Other Ambulatory Visit: Payer: Self-pay | Admitting: Internal Medicine

## 2018-04-23 ENCOUNTER — Ambulatory Visit: Payer: Self-pay | Admitting: Psychology

## 2018-05-18 ENCOUNTER — Ambulatory Visit: Payer: Self-pay | Admitting: Psychology

## 2018-05-25 ENCOUNTER — Ambulatory Visit (INDEPENDENT_AMBULATORY_CARE_PROVIDER_SITE_OTHER): Payer: Self-pay | Admitting: Psychology

## 2018-05-25 DIAGNOSIS — F411 Generalized anxiety disorder: Secondary | ICD-10-CM

## 2018-05-31 ENCOUNTER — Other Ambulatory Visit: Payer: Self-pay | Admitting: Internal Medicine

## 2018-05-31 NOTE — Telephone Encounter (Signed)
Last OV 03/22/18  [place] Controlled Substance Database checked. Last filled on 04/23/18

## 2018-07-10 ENCOUNTER — Other Ambulatory Visit: Payer: Self-pay | Admitting: Internal Medicine

## 2018-07-10 NOTE — Telephone Encounter (Signed)
[place] Controlled Database Checked Last filled: 05/31/18 # 90 LOV w/you: 03/22/18 Next appt w/you: 09/25/18

## 2018-07-18 ENCOUNTER — Ambulatory Visit (INDEPENDENT_AMBULATORY_CARE_PROVIDER_SITE_OTHER): Payer: Self-pay | Admitting: Psychology

## 2018-07-18 DIAGNOSIS — F411 Generalized anxiety disorder: Secondary | ICD-10-CM

## 2018-08-12 ENCOUNTER — Other Ambulatory Visit: Payer: Self-pay | Admitting: Internal Medicine

## 2018-08-13 ENCOUNTER — Ambulatory Visit (INDEPENDENT_AMBULATORY_CARE_PROVIDER_SITE_OTHER): Payer: Self-pay | Admitting: Psychology

## 2018-08-13 DIAGNOSIS — F411 Generalized anxiety disorder: Secondary | ICD-10-CM

## 2018-08-13 NOTE — Telephone Encounter (Signed)
MD approved and sent electronically to pof../lmb  

## 2018-08-13 NOTE — Telephone Encounter (Signed)
Check [place] registry last filled 07/10/2018../lmb  

## 2018-08-21 ENCOUNTER — Other Ambulatory Visit: Payer: Self-pay | Admitting: Internal Medicine

## 2018-09-10 ENCOUNTER — Ambulatory Visit (INDEPENDENT_AMBULATORY_CARE_PROVIDER_SITE_OTHER): Payer: Self-pay | Admitting: Psychology

## 2018-09-10 DIAGNOSIS — F411 Generalized anxiety disorder: Secondary | ICD-10-CM

## 2018-09-21 ENCOUNTER — Ambulatory Visit: Payer: Self-pay | Admitting: Internal Medicine

## 2018-09-24 NOTE — Progress Notes (Signed)
Subjective:    Patient ID: Erica [name], female    DOB: May 04, 1981, 37 y.o.   MRN: [id number]  HPI The patient is here for follow up.  Depression: She is taking the cymbalta daily as prescribed. She denies any side effects from the medication. She feels her depression is well controlled and she is happy with her current dose of medication.   Anxiety: She is taking cymbalta daily as prescribed. She takes the xanax only as needed.  She denies any side effects from the medication. She feels her anxiety is fairly controlled, but still has anxiety.  She is happy with her medication.  She is seeing a therapist.    Chronic, intermittent back spasms:  She has intermittent lower back spasms.  She takes methocarbamol as needed only.  She sees a [facility].    Medications and allergies reviewed with patient and updated if appropriate.  Patient Active Problem List   Diagnosis Date Noted  . Back spasm 12/23/2015  . Anxiety state 11/02/2010  . Depression 11/02/2010  . Allergic rhinitis 11/02/2010    Current Outpatient Medications on File Prior to Visit  Medication Sig Dispense Refill  . DULoxetine (CYMBALTA) 60 MG capsule TAKE 1 CAPSULE BY MOUTH ONCE DAILY 90 capsule 3  . methocarbamol (ROBAXIN) 500 MG tablet TAKE 1 TABLET BY MOUTH EVERY 8 HOURS IF NEEDED FOR MUSCLE SPASM 30 tablet 0  . levonorgestrel (MIRENA, 52 MG,) 20 MCG/24HR IUD 1 Intra Uterine Device (1 each total) by Intrauterine route once. 1 each 0   No current facility-administered medications on file prior to visit.     Past Medical History:  Diagnosis Date  . Allergy   . Anxiety   . Depression     Past Surgical History:  Procedure Laterality Date  . NO PAST SURGERIES      Social History   Socioeconomic History  . Marital status: Married    Spouse name: Not on file  . Number of children: Not on file  . Years of education: Not on file  . Highest education level: Not on file  Occupational History  . Not on file    Social Needs  . Financial resource strain: Not on file  . Food insecurity:    Worry: Not on file    Inability: Not on file  . Transportation needs:    Medical: Not on file    Non-medical: Not on file  Tobacco Use  . Smoking status: Former Games developermoker  . Smokeless tobacco: Never Used  Substance and Sexual Activity  . Alcohol use: Yes    Comment: Social  . Drug use: Yes    Frequency: 1.0 times per week    Types: Marijuana  . Sexual activity: Not on file  Lifestyle  . Physical activity:    Days per week: Not on file    Minutes per session: Not on file  . Stress: Not on file  Relationships  . Social connections:    Talks on phone: Not on file    Gets together: Not on file    Attends religious service: Not on file    Active member of club or organization: Not on file    Attends meetings of clubs or organizations: Not on file    Relationship status: Not on file  Other Topics Concern  . 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    Part time work at day care   Originally from [place],  [place] undergrad, s/p [employer] for gradschool    Family History  Problem Relation Age of Onset  . Alcohol abuse Mother   . Alcohol abuse Other        Whole family  . Breast cancer Other   . Lung cancer Other     Review of Systems  Constitutional: Negative for appetite change and unexpected weight change.  Psychiatric/Behavioral: Positive for dysphoric mood. Negative for sleep disturbance. The patient is nervous/anxious.        Objective:   Vitals:   09/25/18 1120  BP: 134/88  Pulse: (!) 51  Resp: 16  Temp: 98.2 F (36.8 C)  SpO2: 97%   BP Readings from Last 3 Encounters:  09/25/18 134/88  03/22/18 108/80  04/05/17 124/82   Wt Readings from Last 3 Encounters:  09/25/18 193 lb (87.5 kg)  03/22/18 194 lb (88 kg)  04/05/17 197 lb (89.4 kg)   Body mass index is 29.35 kg/m.   Physical Exam Constitutional:      General: She is not in acute  distress.    Appearance: Normal appearance. She is not ill-appearing.  Neurological:     Mental Status: She is alert.  Psychiatric:        Mood and Affect: Mood normal.        Behavior: Behavior normal.        Thought Content: Thought content normal.        Judgment: Judgment normal.            Assessment & Plan:    See Problem List for Assessment and Plan of chronic medical problems.

## 2018-09-24 NOTE — Patient Instructions (Addendum)
  Medications reviewed and updated.  Changes include :   none  Your prescription(s) have been submitted to your pharmacy. Please take as directed and contact our office if you believe you are having problem(s) with the medication(s).   Please followup in 6 months    [facility]  [address]  [address]  [place], [place] [address]  Main: [phone]

## 2018-09-25 ENCOUNTER — Encounter: Payer: Self-pay | Admitting: Internal Medicine

## 2018-09-25 ENCOUNTER — Ambulatory Visit (INDEPENDENT_AMBULATORY_CARE_PROVIDER_SITE_OTHER): Payer: Self-pay | Admitting: Internal Medicine

## 2018-09-25 VITALS — BP 134/88 | HR 51 | Temp 98.2°F | Resp 16 | Ht 68.0 in | Wt 193.0 lb

## 2018-09-25 DIAGNOSIS — M62830 Muscle spasm of back: Secondary | ICD-10-CM

## 2018-09-25 DIAGNOSIS — Z23 Encounter for immunization: Secondary | ICD-10-CM

## 2018-09-25 DIAGNOSIS — F3289 Other specified depressive episodes: Secondary | ICD-10-CM

## 2018-09-25 DIAGNOSIS — F411 Generalized anxiety disorder: Secondary | ICD-10-CM

## 2018-09-25 MED ORDER — ALPRAZOLAM 0.5 MG PO TABS: ORAL_TABLET | ORAL | 0 refills | Status: DC

## 2018-09-25 NOTE — Addendum Note (Signed)
Addended by: Mercer PodWRENN, Nalin Mazzocco E on: 09/25/2018 01:04 PM   Modules accepted: Orders

## 2018-09-25 NOTE — Assessment & Plan Note (Signed)
Seeing a therapist Continue cymbalta Takes xanax regularly Overall controlled, continue current medications at current doses

## 2018-09-25 NOTE — Assessment & Plan Note (Signed)
Intermittent pain Uses robaxin as needed Still sees chiropractor Doing yoga/pilates continue

## 2018-09-25 NOTE — Assessment & Plan Note (Signed)
Seeing a therapist Continue cymbalta Overall controlled, continue current medications at current doses

## 2018-10-01 ENCOUNTER — Ambulatory Visit (INDEPENDENT_AMBULATORY_CARE_PROVIDER_SITE_OTHER): Payer: Self-pay | Admitting: Psychology

## 2018-10-01 DIAGNOSIS — F411 Generalized anxiety disorder: Secondary | ICD-10-CM

## 2018-10-24 ENCOUNTER — Ambulatory Visit (INDEPENDENT_AMBULATORY_CARE_PROVIDER_SITE_OTHER): Payer: Self-pay | Admitting: Psychology

## 2018-10-24 DIAGNOSIS — F411 Generalized anxiety disorder: Secondary | ICD-10-CM

## 2018-10-29 ENCOUNTER — Other Ambulatory Visit: Payer: Self-pay | Admitting: Internal Medicine

## 2018-10-30 NOTE — Telephone Encounter (Signed)
Last routine OV was 09/25/18 Last refill was 09/25/18 Next OV 03/26/19

## 2018-11-14 ENCOUNTER — Ambulatory Visit (INDEPENDENT_AMBULATORY_CARE_PROVIDER_SITE_OTHER): Payer: Self-pay | Admitting: Psychology

## 2018-11-14 DIAGNOSIS — F411 Generalized anxiety disorder: Secondary | ICD-10-CM

## 2018-12-04 ENCOUNTER — Other Ambulatory Visit: Payer: Self-pay | Admitting: Internal Medicine

## 2018-12-04 NOTE — Telephone Encounter (Signed)
Last OV 09/25/18 Next OV 03/26/19 Last RF 10/31/18

## 2018-12-12 ENCOUNTER — Ambulatory Visit (INDEPENDENT_AMBULATORY_CARE_PROVIDER_SITE_OTHER): Payer: Self-pay | Admitting: Psychology

## 2018-12-12 ENCOUNTER — Other Ambulatory Visit: Payer: Self-pay

## 2018-12-12 DIAGNOSIS — F411 Generalized anxiety disorder: Secondary | ICD-10-CM

## 2019-01-16 ENCOUNTER — Ambulatory Visit (INDEPENDENT_AMBULATORY_CARE_PROVIDER_SITE_OTHER): Payer: Self-pay | Admitting: Psychology

## 2019-01-16 DIAGNOSIS — F411 Generalized anxiety disorder: Secondary | ICD-10-CM

## 2019-01-28 ENCOUNTER — Ambulatory Visit (INDEPENDENT_AMBULATORY_CARE_PROVIDER_SITE_OTHER): Payer: Self-pay | Admitting: Psychology

## 2019-01-28 DIAGNOSIS — F411 Generalized anxiety disorder: Secondary | ICD-10-CM

## 2019-02-05 ENCOUNTER — Other Ambulatory Visit: Payer: Self-pay | Admitting: Internal Medicine

## 2019-02-06 NOTE — Telephone Encounter (Signed)
Last OV 09/25/18 Next OV 03/26/19  [place] Controlled Substance Database checked. Last filled on 12/04/18

## 2019-02-19 ENCOUNTER — Ambulatory Visit (INDEPENDENT_AMBULATORY_CARE_PROVIDER_SITE_OTHER): Payer: Self-pay | Admitting: Psychology

## 2019-02-19 DIAGNOSIS — F411 Generalized anxiety disorder: Secondary | ICD-10-CM

## 2019-03-11 ENCOUNTER — Ambulatory Visit (INDEPENDENT_AMBULATORY_CARE_PROVIDER_SITE_OTHER): Payer: Self-pay | Admitting: Psychology

## 2019-03-11 DIAGNOSIS — F411 Generalized anxiety disorder: Secondary | ICD-10-CM

## 2019-03-25 NOTE — Progress Notes (Signed)
Subjective:    Patient ID: Erica [name], female    DOB: 1981/06/18, 38 y.o.   MRN: [id number]  HPI The patient is here for follow up.  She is exercising regularly.     She is seeing a therapist.    Depression: She is taking her medication daily as prescribed. She denies any side effects from the medication. She feels her depression is fairly controlled given the circumstances right now.  She is not sure if increasing the medication would help because she thinks that her depression she feels at times is circumstantial.     Anxiety: She is taking her cymbalta daily as prescribed. She takes xanax 1-2 times a day.  She denies any side effects from the medication. She feels her anxiety is controlled and she is happy with her current dose of medication.   Chronic, intermittent back spasms: She is intermittent spasms in her back.  She takes the methocarbamol as needed, which is not often.  She is following with her chiropractor.     Medications and allergies reviewed with patient and updated if appropriate.  Patient Active Problem List   Diagnosis Date Noted  . Back spasm 12/23/2015  . Anxiety state 11/02/2010  . Depression 11/02/2010  . Allergic rhinitis 11/02/2010    Current Outpatient Medications on File Prior to Visit  Medication Sig Dispense Refill  . ALPRAZolam (XANAX) 0.5 MG tablet TAKE 1 TABLET(0.5 MG) BY MOUTH THREE TIMES DAILY AS NEEDED FOR ANXIETY 90 tablet 0  . methocarbamol (ROBAXIN) 500 MG tablet TAKE 1 TABLET BY MOUTH EVERY 8 HOURS IF NEEDED FOR MUSCLE SPASM 30 tablet 0  . levonorgestrel (MIRENA, 52 MG,) 20 MCG/24HR IUD 1 Intra Uterine Device (1 each total) by Intrauterine route once. 1 each 0   No current facility-administered medications on file prior to visit.     Past Medical History:  Diagnosis Date  . Allergy   . Anxiety   . Depression     Past Surgical History:  Procedure Laterality Date  . NO PAST SURGERIES      Social History   Socioeconomic  History  . Marital status: Married    Spouse name: Not on file  . Number of children: Not on file  . Years of education: Not on file  . Highest education level: Not on file  Occupational History  . Not on file  Social Needs  . Financial resource strain: Not on file  . Food insecurity    Worry: Not on file    Inability: Not on file  . Transportation needs    Medical: Not on file    Non-medical: Not on file  Tobacco Use  . Smoking status: Former Games developermoker  . Smokeless tobacco: Never Used  Substance and Sexual Activity  . Alcohol use: Yes    Comment: Social  . Drug use: Yes    Frequency: 1.0 times per week    Types: Marijuana  . Sexual activity: Not on file  Lifestyle  . Physical activity    Days per week: Not on file    Minutes per session: Not on file  . Stress: Not on file  Relationships  . Social Musicianconnections    Talks on phone: Not on file    Gets together: Not on file    Attends religious service: Not on file    Active member of club or organization: Not on file    Attends meetings of clubs or organizations: Not on file  Relationship status: Not on file  Other Topics Concern  . 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    Part time work at day care   Originally from [place], [place], s/p [employer] for gradschool    Family History  Problem Relation Age of Onset  . Alcohol abuse Mother   . Alcohol abuse Other        Whole family  . Breast cancer Other   . Lung cancer Other     Review of Systems  Constitutional: Negative for chills and fever.  Respiratory: Positive for cough (smoking and allergies). Negative for shortness of breath and wheezing.   Cardiovascular: Negative for chest pain, palpitations and leg swelling.  Neurological: Negative for light-headedness and headaches.       Objective:   Vitals:   03/26/19 1105  BP: 118/74  Pulse: 62  Resp: 16  Temp: 97.7 F (36.5 C)  SpO2: 98%   BP Readings  from Last 3 Encounters:  03/26/19 118/74  09/25/18 134/88  03/22/18 108/80   Wt Readings from Last 3 Encounters:  03/26/19 197 lb (89.4 kg)  09/25/18 193 lb (87.5 kg)  03/22/18 194 lb (88 kg)   Body mass index is 29.95 kg/m.   Physical Exam    Constitutional: Appears well-developed and well-nourished. No distress.  HENT:  Head: Normocephalic and atraumatic.  Neck: Neck supple. No tracheal deviation present. No thyromegaly present.  No cervical lymphadenopathy Cardiovascular: Normal rate, regular rhythm and normal heart sounds.  No murmur heard. No carotid bruit .  No edema Pulmonary/Chest: Effort normal and breath sounds normal. No respiratory distress. No has no wheezes. No rales.  Skin: Skin is warm and dry. Not diaphoretic.  Psychiatric: Normal mood and affect. Behavior is normal.  Thought process is normal.      Assessment & Plan:    See Problem List for Assessment and Plan of chronic medical problems.

## 2019-03-26 ENCOUNTER — Ambulatory Visit (INDEPENDENT_AMBULATORY_CARE_PROVIDER_SITE_OTHER): Payer: Self-pay | Admitting: Internal Medicine

## 2019-03-26 ENCOUNTER — Encounter: Payer: Self-pay | Admitting: Internal Medicine

## 2019-03-26 ENCOUNTER — Other Ambulatory Visit: Payer: Self-pay

## 2019-03-26 VITALS — BP 118/74 | HR 62 | Temp 97.7°F | Resp 16 | Ht 68.0 in | Wt 197.0 lb

## 2019-03-26 DIAGNOSIS — F411 Generalized anxiety disorder: Secondary | ICD-10-CM

## 2019-03-26 DIAGNOSIS — M62830 Muscle spasm of back: Secondary | ICD-10-CM

## 2019-03-26 DIAGNOSIS — F3289 Other specified depressive episodes: Secondary | ICD-10-CM

## 2019-03-26 MED ORDER — DULOXETINE HCL 60 MG PO CPEP: 60.0000 mg | ORAL_CAPSULE | Freq: Every day | ORAL | 1 refills | Status: DC

## 2019-03-26 NOTE — Assessment & Plan Note (Signed)
Occasional back spasms Take methocarbamol as needed continue

## 2019-03-26 NOTE — Patient Instructions (Signed)
   Medications reviewed and updated.  Changes include :   none  Your prescription(s) have been submitted to your pharmacy. Please take as directed and contact our office if you believe you are having problem(s) with the medication(s).    Please followup in 6 months   

## 2019-03-26 NOTE — Assessment & Plan Note (Signed)
Overall controlled Continue cymbalta at current dose

## 2019-03-26 NOTE — Assessment & Plan Note (Signed)
Overall controlled Continue cymbalta at current dose, xanax 1-2 times a day

## 2019-03-27 ENCOUNTER — Ambulatory Visit (INDEPENDENT_AMBULATORY_CARE_PROVIDER_SITE_OTHER): Payer: Self-pay | Admitting: Psychology

## 2019-03-27 DIAGNOSIS — F411 Generalized anxiety disorder: Secondary | ICD-10-CM

## 2019-04-08 ENCOUNTER — Other Ambulatory Visit: Payer: Self-pay | Admitting: Internal Medicine

## 2019-04-08 NOTE — Telephone Encounter (Signed)
Next OV 09/30/19 Last OV 03/26/19 Last RF 02/06/19

## 2019-04-09 ENCOUNTER — Ambulatory Visit (INDEPENDENT_AMBULATORY_CARE_PROVIDER_SITE_OTHER): Payer: Self-pay | Admitting: Psychology

## 2019-04-09 DIAGNOSIS — F411 Generalized anxiety disorder: Secondary | ICD-10-CM

## 2019-04-30 ENCOUNTER — Other Ambulatory Visit: Payer: Self-pay

## 2019-05-01 ENCOUNTER — Encounter: Payer: Self-pay | Admitting: Obstetrics & Gynecology

## 2019-05-01 ENCOUNTER — Other Ambulatory Visit: Payer: Self-pay

## 2019-05-01 ENCOUNTER — Ambulatory Visit: Payer: Self-pay | Admitting: Psychology

## 2019-05-01 ENCOUNTER — Ambulatory Visit (INDEPENDENT_AMBULATORY_CARE_PROVIDER_SITE_OTHER): Payer: Self-pay | Admitting: Obstetrics & Gynecology

## 2019-05-01 VITALS — Ht 67.0 in | Wt 195.0 lb

## 2019-05-01 DIAGNOSIS — Z6830 Body mass index (BMI) 30.0-30.9, adult: Secondary | ICD-10-CM

## 2019-05-01 DIAGNOSIS — F17210 Nicotine dependence, cigarettes, uncomplicated: Secondary | ICD-10-CM

## 2019-05-01 DIAGNOSIS — Z1151 Encounter for screening for human papillomavirus (HPV): Secondary | ICD-10-CM

## 2019-05-01 DIAGNOSIS — Z01419 Encounter for gynecological examination (general) (routine) without abnormal findings: Secondary | ICD-10-CM

## 2019-05-01 DIAGNOSIS — Z113 Encounter for screening for infections with a predominantly sexual mode of transmission: Secondary | ICD-10-CM

## 2019-05-01 DIAGNOSIS — Z30431 Encounter for routine checking of intrauterine contraceptive device: Secondary | ICD-10-CM

## 2019-05-01 DIAGNOSIS — E6609 Other obesity due to excess calories: Secondary | ICD-10-CM

## 2019-05-01 NOTE — Progress Notes (Signed)
Erica [name] 1981/09/13 [id number]   History:    38 y.o. G1P1L1 Divorced.  Boyfriend.  Daughter is 646 1/40 yo.  RP:  Established patient presenting for annual gyn exam   HPI: Well on Liletta IUD since December 2017.  No breakthrough bleeding.  No pelvic pain.  No pain with intercourse.  Not always using condoms.  Breasts normal.  Urine and bowel movements normal.  Body mass index 30.54.  Good fitness and healthy nutrition.  Health labs with family physician.  Past medical history,surgical history, family history and social history were all reviewed and documented in the EPIC chart.  Gynecologic History No LMP recorded. (Menstrual status: IUD). Contraception: Lilletta IUD x 08/2016 Last Pap: 06/2016. Results were: Negative Last mammogram: Will [facility]. Bone Density: Never Colonoscopy: Never  Obstetric History OB History  Gravida Para Term Preterm AB Living  1 1 1  0 0 1  SAB TAB Ectopic Multiple Live Births  0 0 0 0 1    # Outcome Date GA Lbr Len/2nd Weight Sex Delivery Anes PTL Lv  1 Term 08/29/12 [id number] 06:22 / 00:14 7 lb 1.8 oz (3.226 kg) F Vag-Spont Local  LIV     ROS: A ROS was performed and pertinent positives and negatives are included in the history.  GENERAL: No fevers or chills. HEENT: No change in vision, no earache, sore throat or sinus congestion. NECK: No pain or stiffness. CARDIOVASCULAR: No chest pain or pressure. No palpitations. PULMONARY: No shortness of breath, cough or wheeze. GASTROINTESTINAL: No abdominal pain, nausea, vomiting or diarrhea, melena or bright red blood per rectum. GENITOURINARY: No urinary frequency, urgency, hesitancy or dysuria. MUSCULOSKELETAL: No joint or muscle pain, no back pain, no recent trauma. DERMATOLOGIC: No rash, no itching, no lesions. ENDOCRINE: No polyuria, polydipsia, no heat or cold intolerance. No recent change in weight. HEMATOLOGICAL: No anemia or easy bruising or bleeding.  NEUROLOGIC: No headache, seizures, numbness, tingling or weakness. PSYCHIATRIC: No depression, no loss of interest in normal activity or change in sleep pattern.     Exam:   Ht 5\' 7"  (1.702 m)   Wt 195 lb (88.5 kg)   BMI 30.54 kg/m   Body mass index is 30.54 kg/m.  General appearance : Well developed well nourished female. No acute distress HEENT: Eyes: no retinal hemorrhage or exudates,  Neck supple, trachea midline, no carotid bruits, no thyroidmegaly Lungs: Clear to auscultation, no rhonchi or wheezes, or rib retractions  Heart: Regular rate and rhythm, no murmurs or gallops Breast:Examined in sitting and supine position were symmetrical in appearance, no palpable masses or tenderness,  no skin retraction, no nipple inversion, no nipple discharge, no skin discoloration, no axillary or supraclavicular lymphadenopathy Abdomen: no palpable masses or tenderness, no rebound or guarding Extremities: no edema or skin discoloration or tenderness  Pelvic: Vulva: Normal             Vagina: No gross lesions or discharge  Cervix: No gross lesions or discharge.  IUD strings visible at the exocervix.  Pap/HPV HR, Gono-Chlam done.  Uterus  AV, normal size, shape and consistency, non-tender and mobile  Adnexa  Without masses or tenderness  Anus: Normal   Assessment/Plan:  38 y.o. female for annual exam   1. Encounter for routine gynecological examination with Papanicolaou smear of cervix Normal gynecologic exam.  Pap test with high-risk HPV done today.  Breast exam normal.  Will schedule first screening mammogram at the [facility] center  now.  Health labs with family physician.  2. Encounter for routine checking of intrauterine contraceptive device (IUD) Liletta IUD in good position and well-tolerated since December 2017.  3. Screen for STD (sexually transmitted disease) Condom use recommended. - Gono-Chlam done on Pap - HIV antibody (with reflex) - Hepatitis C Antibody - Hepatitis B  Surface AntiGEN - RPR  4. Cigarette smoker Recommended to quit smoking.  5. Class 1 obesity due to excess calories without serious comorbidity with body mass index (BMI) of 30.0 to 30.9 in adult Lower calorie/carb diet such as [employer] recommended.  I aerobic physical activities 5 times a week and weightlifting every 2 days.  Other orders - cyclobenzaprine (FLEXERIL) 10 MG tablet; Take 10 mg by mouth 3 (three) times daily as needed for muscle spasms.  Princess Bruins MD, 10:22 AM 05/01/2019

## 2019-05-02 LAB — PAP IG, CT-NG NAA, HPV HIGH-RISK
C. trachomatis RNA, TMA: NOT DETECTED
HPV DNA High Risk: NOT DETECTED
N. gonorrhoeae RNA, TMA: NOT DETECTED

## 2019-05-02 LAB — RPR: RPR Ser Ql: NONREACTIVE

## 2019-05-02 LAB — HIV ANTIBODY (ROUTINE TESTING W REFLEX): HIV 1&2 Ab, 4th Generation: NONREACTIVE

## 2019-05-02 LAB — HEPATITIS B SURFACE ANTIGEN: Hepatitis B Surface Ag: NONREACTIVE

## 2019-05-02 LAB — HEPATITIS C ANTIBODY
Hepatitis C Ab: NONREACTIVE
SIGNAL TO CUT-OFF: 0.04 (ref <1.00)

## 2019-05-06 ENCOUNTER — Encounter: Payer: Self-pay | Admitting: Obstetrics & Gynecology

## 2019-05-06 NOTE — Patient Instructions (Signed)
1. Encounter for routine gynecological examination with Papanicolaou smear of cervix Normal gynecologic exam.  Pap test with high-risk HPV done today.  Breast exam normal.  Will schedule first screening mammogram at the breast center now.  Health labs with family physician.  2. Encounter for routine checking of intrauterine contraceptive device (IUD) Liletta IUD in good position and well-tolerated since December 2017.  3. Screen for STD (sexually transmitted disease) Condom use recommended. - Gono-Chlam done on Pap - HIV antibody (with reflex) - Hepatitis C Antibody - Hepatitis B Surface AntiGEN - RPR  4. Cigarette smoker Recommended to quit smoking.  5. Class 1 obesity due to excess calories without serious comorbidity with body mass index (BMI) of 30.0 to 30.9 in adult Lower calorie/carb diet such as [employer] recommended.  I aerobic physical activities 5 times a week and weightlifting every 2 days.  Other orders - cyclobenzaprine (FLEXERIL) 10 MG tablet; Take 10 mg by mouth 3 (three) times daily as needed for muscle spasms.  Erica [name], it was a pleasure seeing you today!  I will inform you of your results as soon as they are available.

## 2019-05-15 ENCOUNTER — Ambulatory Visit (INDEPENDENT_AMBULATORY_CARE_PROVIDER_SITE_OTHER): Payer: Self-pay | Admitting: Psychology

## 2019-05-15 DIAGNOSIS — F411 Generalized anxiety disorder: Secondary | ICD-10-CM

## 2019-05-30 ENCOUNTER — Other Ambulatory Visit: Payer: Self-pay | Admitting: Internal Medicine

## 2019-05-30 MED ORDER — DULOXETINE HCL 60 MG PO CPEP: 60.0000 mg | ORAL_CAPSULE | Freq: Every day | ORAL | 1 refills | Status: DC

## 2019-05-30 NOTE — Telephone Encounter (Signed)
Routing to CMA 

## 2019-05-30 NOTE — Telephone Encounter (Signed)
Message left for patient today with refill info.

## 2019-05-30 NOTE — Telephone Encounter (Signed)
Based on the phone number I think I found the correct [facility] - a 2 week supply was sent - not sure how long she is staying

## 2019-05-30 NOTE — Telephone Encounter (Signed)
Requested medication (s) are due for refill today: yes  Requested medication (s) are on the active medication list: yes  Last refill:  03/26/19  Future visit scheduled: yes   Notes to clinic: She states there is a [facility] in [place] with there phone [phone] would it be possible to send a script there? If not she will just be out of meds for a while.   Requested Prescriptions  Pending Prescriptions Disp Refills   DULoxetine (CYMBALTA) 60 MG capsule 90 capsule 1    Sig: Take 1 capsule (60 mg total) by mouth daily.     Psychiatry: Antidepressants - SNRI Passed - 05/30/2019  9:26 AM      Passed - Last BP in normal range    BP Readings from Last 1 Encounters:  03/26/19 118/74         Passed - Valid encounter within last 6 months    Recent Outpatient Visits          2 months ago Anxiety state   [place], Claudina Lick, MD   8 months ago Anxiety state   [facility], Claudina Lick, MD   1 year ago Other depression   [place], Claudina Lick, MD   2 years ago Preventative health care   [place], MD   3 years ago [place], MD      Future Appointments            In 4 months [place], Claudina Lick, MD [place], [place] - Completed PHQ-2 or PHQ-9 in the last 360 days.

## 2019-05-30 NOTE — Telephone Encounter (Signed)
Special request for Dr Quay Burow. Pt is in flight to [place] and discover she forgot DULoxetine (CYMBALTA) 60 MG capsule  She states there is a Writer in [place] with there phone [phone] would it be possible to send a script there? If not she will just be out of meds for a while. Appreciated if cold help.

## 2019-06-18 ENCOUNTER — Ambulatory Visit (INDEPENDENT_AMBULATORY_CARE_PROVIDER_SITE_OTHER): Payer: Self-pay | Admitting: Psychology

## 2019-06-18 DIAGNOSIS — F411 Generalized anxiety disorder: Secondary | ICD-10-CM

## 2019-06-24 ENCOUNTER — Other Ambulatory Visit: Payer: Self-pay | Admitting: Internal Medicine

## 2019-06-24 NOTE — Telephone Encounter (Signed)
Last OV 03/26/19 Next OV 09/30/19 Last RF 04/08/19

## 2019-07-09 ENCOUNTER — Ambulatory Visit (INDEPENDENT_AMBULATORY_CARE_PROVIDER_SITE_OTHER): Payer: Self-pay | Admitting: Psychology

## 2019-07-09 DIAGNOSIS — F411 Generalized anxiety disorder: Secondary | ICD-10-CM

## 2019-07-30 ENCOUNTER — Ambulatory Visit (INDEPENDENT_AMBULATORY_CARE_PROVIDER_SITE_OTHER): Payer: Self-pay | Admitting: Psychology

## 2019-07-30 DIAGNOSIS — F411 Generalized anxiety disorder: Secondary | ICD-10-CM

## 2019-08-05 ENCOUNTER — Other Ambulatory Visit: Payer: Self-pay

## 2019-08-05 DIAGNOSIS — Z20822 Contact with and (suspected) exposure to covid-19: Secondary | ICD-10-CM

## 2019-08-06 LAB — NOVEL CORONAVIRUS, NAA: SARS-CoV-2, NAA: NOT DETECTED

## 2019-08-08 ENCOUNTER — Other Ambulatory Visit: Payer: Self-pay | Admitting: Internal Medicine

## 2019-08-13 ENCOUNTER — Ambulatory Visit (INDEPENDENT_AMBULATORY_CARE_PROVIDER_SITE_OTHER): Payer: Self-pay | Admitting: Psychology

## 2019-08-13 DIAGNOSIS — F411 Generalized anxiety disorder: Secondary | ICD-10-CM

## 2019-08-27 ENCOUNTER — Ambulatory Visit (INDEPENDENT_AMBULATORY_CARE_PROVIDER_SITE_OTHER): Payer: Self-pay | Admitting: Psychology

## 2019-08-27 DIAGNOSIS — F411 Generalized anxiety disorder: Secondary | ICD-10-CM

## 2019-09-16 ENCOUNTER — Ambulatory Visit: Payer: Self-pay | Admitting: Psychology

## 2019-09-26 NOTE — Patient Instructions (Addendum)
Tetanus immunization administered today.     Medications reviewed and updated.  Changes include :   none  Your prescription(s) have been submitted to your pharmacy. Please take as directed and contact our office if you believe you are having problem(s) with the  medication(s).    Please followup in 6 months

## 2019-09-26 NOTE — Progress Notes (Signed)
Subjective:    Patient ID: Erica [name], female    DOB: 1981/03/31, 38 y.o.   MRN: [id number]  HPI The patient is here for follow up.   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    Depression: She is taking her medication daily as prescribed. She denies any side effects from the medication. She feels her depression is well controlled and she is happy with her current dose of medication.  She is seeing a therapist.    Anxiety: She is taking her medication daily as prescribed. She does take the xanax regularly.  She denies any side effects from the medication. She feels her anxiety is well controlled and she is happy with her current dose of medication.   Chronic, intermittent back spasms:  She take flexeril as needed.  Her back has been good recently.  She sees the chiropractor regularly.     She is working on quitting smoking.   She has a new boyfriend.    Medications and allergies reviewed with patient and updated if appropriate.  Patient Active Problem List   Diagnosis Date Noted  . Back spasm 12/23/2015  . Anxiety state 11/02/2010  . Depression 11/02/2010  . Allergic rhinitis 11/02/2010    Current Outpatient Medications on File Prior to Visit  Medication Sig Dispense Refill  . ALPRAZolam (XANAX) 0.5 MG tablet TAKE 1 TABLET(0.5 MG) BY MOUTH THREE TIMES DAILY AS NEEDED FOR ANXIETY 90 tablet 0  . cyclobenzaprine (FLEXERIL) 10 MG tablet Take 10 mg by mouth 3 (three) times daily as needed for muscle spasms.    . DULoxetine (CYMBALTA) 60 MG capsule Take 1 capsule (60 mg total) by mouth daily. 14 capsule 1  . levonorgestrel (MIRENA, 52 MG,) 20 MCG/24HR IUD 1 Intra Uterine Device (1 each total) by Intrauterine route once. 1 each 0   No current facility-administered medications on file prior to visit.    Past Medical History:  Diagnosis Date  . Allergy   . Anxiety   . Depression     Past Surgical History:  Procedure Laterality Date  . NO PAST  SURGERIES      Social History   Socioeconomic History  . Marital status: Married    Spouse name: Not on file  . Number of children: Not on file  . Years of education: Not on file  . Highest education level: Not on file  Occupational History  . Not on file  Tobacco Use  . Smoking status: Current Every Day Smoker    Packs/day: 0.50  . Smokeless tobacco: Never Used  Substance and Sexual Activity  . Alcohol use: Yes    Comment: 10 drinks week  . Drug use: Yes    Frequency: 1.0 times per week    Types: Marijuana  . Sexual activity: Yes    Partners: Male    Birth control/protection: I.U.D.    Comment: 1st intercourse- 13, partners- 25,  Other Topics Concern  . 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    Part time work at day care   Originally from [place], [place], s/p [employer] for [employer]   Social Determinants of Health   Financial Resource Strain:   . Difficulty of Paying Living Expenses: Not on file  Food Insecurity:   . Worried About Programme researcher, broadcasting/film/video in the Last Year: Not on file  . Ran Out of Food in the Last Year: Not on file  Transportation Needs:   . Film/video editor (Medical): Not on file  . Lack of Transportation (Non-Medical): Not on file  Physical Activity:   . Days of Exercise per Week: Not on file  . Minutes of Exercise per Session: Not on file  Stress:   . Feeling of Stress : Not on file  Social Connections:   . Frequency of Communication with Friends and Family: Not on file  . Frequency of Social Gatherings with Friends and Family: Not on file  . Attends Religious Services: Not on file  . Active Member of Clubs or Organizations: Not on file  . Attends Archivist Meetings: Not on file  . Marital Status: Not on file    Family History  Problem Relation Age of Onset  . Alcohol abuse Mother   . Alcohol abuse Other        Whole family  . Breast cancer Other   . Lung cancer Other      Review of Systems  Constitutional: Negative for chills and fever.  Respiratory: Negative for shortness of breath.   Cardiovascular: Negative for chest pain, palpitations and leg swelling.  Neurological: Negative for light-headedness and headaches.  Psychiatric/Behavioral: Positive for dysphoric mood. Negative for sleep disturbance. The patient is nervous/anxious.        Objective:   Vitals:   09/30/19 1031  BP: 118/76  Pulse: (!) 50  Temp: 98.4 F (36.9 C)  SpO2: 97%   BP Readings from Last 3 Encounters:  09/30/19 118/76  03/26/19 118/74  09/25/18 134/88   Wt Readings from Last 3 Encounters:  09/30/19 196 lb (88.9 kg)  05/01/19 195 lb (88.5 kg)  03/26/19 197 lb (89.4 kg)   Body mass index is 30.7 kg/m.   Physical Exam    Constitutional: Appears well-developed and well-nourished. No distress.  HENT:  Head: Normocephalic and atraumatic.  Neck: Neck supple. No tracheal deviation present. No thyromegaly present.  No cervical lymphadenopathy Cardiovascular: Normal rate, regular rhythm and normal heart sounds.   No murmur heard. No carotid bruit .  No edema Pulmonary/Chest: Effort normal and breath sounds normal. No respiratory distress. No has no wheezes. No rales.  Skin: Skin is warm and dry. Not diaphoretic.  Psychiatric: Normal mood and affect. Behavior is normal.      Assessment & Plan:    See Problem List for Assessment and Plan of chronic medical problems.    This visit occurred during the SARS-CoV-2 public health emergency.  Safety protocols were in place, including screening questions prior to the visit, additional usage of staff PPE, and extensive cleaning of exam room while observing appropriate contact time as indicated for disinfecting solutions.

## 2019-09-30 ENCOUNTER — Ambulatory Visit (INDEPENDENT_AMBULATORY_CARE_PROVIDER_SITE_OTHER): Payer: Self-pay | Admitting: Internal Medicine

## 2019-09-30 ENCOUNTER — Ambulatory Visit: Payer: Self-pay | Admitting: Internal Medicine

## 2019-09-30 ENCOUNTER — Other Ambulatory Visit: Payer: Self-pay

## 2019-09-30 ENCOUNTER — Encounter: Payer: Self-pay | Admitting: Internal Medicine

## 2019-09-30 VITALS — BP 118/76 | HR 50 | Temp 98.4°F | Ht 67.0 in | Wt 196.0 lb

## 2019-09-30 DIAGNOSIS — M62830 Muscle spasm of back: Secondary | ICD-10-CM

## 2019-09-30 DIAGNOSIS — Z23 Encounter for immunization: Secondary | ICD-10-CM

## 2019-09-30 DIAGNOSIS — F411 Generalized anxiety disorder: Secondary | ICD-10-CM

## 2019-09-30 DIAGNOSIS — F3289 Other specified depressive episodes: Secondary | ICD-10-CM

## 2019-09-30 MED ORDER — DULOXETINE HCL 60 MG PO CPEP: 60.0000 mg | ORAL_CAPSULE | Freq: Every day | ORAL | 1 refills | Status: DC

## 2019-09-30 MED ORDER — ALPRAZOLAM 0.5 MG PO TABS: ORAL_TABLET | ORAL | 0 refills | Status: DC

## 2019-09-30 NOTE — Assessment & Plan Note (Signed)
Controlled, stable Continue current dose of medication - cymbalta 60 mg daily  

## 2019-09-30 NOTE — Assessment & Plan Note (Signed)
Chronic, intermittent Takes flexeril as needed- has needed it recently

## 2019-09-30 NOTE — Assessment & Plan Note (Signed)
Controlled, stable Continue current dose of medication cymbalta 60 mg daily Xanax prn

## 2019-09-30 NOTE — Addendum Note (Signed)
Addended by: Mercer Pod E on: 09/30/2019 02:17 PM   Modules accepted: Orders

## 2019-10-01 ENCOUNTER — Ambulatory Visit (INDEPENDENT_AMBULATORY_CARE_PROVIDER_SITE_OTHER): Payer: Self-pay | Admitting: Psychology

## 2019-10-01 DIAGNOSIS — F411 Generalized anxiety disorder: Secondary | ICD-10-CM

## 2019-10-29 ENCOUNTER — Ambulatory Visit (INDEPENDENT_AMBULATORY_CARE_PROVIDER_SITE_OTHER): Payer: Self-pay | Admitting: Psychology

## 2019-10-29 DIAGNOSIS — F411 Generalized anxiety disorder: Secondary | ICD-10-CM

## 2019-12-03 ENCOUNTER — Other Ambulatory Visit: Payer: Self-pay | Admitting: Internal Medicine

## 2019-12-03 NOTE — Telephone Encounter (Signed)
Check [facility] registry last filled 09/30/2019../lmb  

## 2019-12-12 ENCOUNTER — Ambulatory Visit: Payer: Self-pay | Admitting: Psychology

## 2020-01-01 ENCOUNTER — Ambulatory Visit (INDEPENDENT_AMBULATORY_CARE_PROVIDER_SITE_OTHER): Payer: Self-pay | Admitting: Psychology

## 2020-01-01 DIAGNOSIS — F411 Generalized anxiety disorder: Secondary | ICD-10-CM

## 2020-01-27 ENCOUNTER — Other Ambulatory Visit: Payer: Self-pay | Admitting: Internal Medicine

## 2020-01-28 NOTE — Telephone Encounter (Signed)
Last OV 09/30/19, next visit 03/31/20, last refilled 12/03/19

## 2020-02-12 ENCOUNTER — Ambulatory Visit (INDEPENDENT_AMBULATORY_CARE_PROVIDER_SITE_OTHER): Payer: Self-pay | Admitting: Psychology

## 2020-02-12 DIAGNOSIS — F411 Generalized anxiety disorder: Secondary | ICD-10-CM

## 2020-03-11 ENCOUNTER — Ambulatory Visit (INDEPENDENT_AMBULATORY_CARE_PROVIDER_SITE_OTHER): Payer: Self-pay | Admitting: Psychology

## 2020-03-11 DIAGNOSIS — F411 Generalized anxiety disorder: Secondary | ICD-10-CM

## 2020-03-29 NOTE — Progress Notes (Signed)
Subjective:    Patient ID: Erica [name], female    DOB: 1981-09-04, 39 y.o.   MRN: [id number]  HPI The patient is here for follow up of their chronic medical problems, including depression, anxiety and back spasms.    She quit smoking.  She feels better overall.   Raised freckle on left cheek.  Did not go away with steroid cream.  Mom has had melanoma.  She last saw a dermatologist 30 years ago.    She sees the chiropractor every few weeks.  She gets a pinching his left upper back that radiates down left arm.  She gets locking up in left chest and right mid back. She sees a Land regularly.      Medications and allergies reviewed with patient and updated if appropriate.  Patient Active Problem List   Diagnosis Date Noted  . Back spasm 12/23/2015  . Anxiety state 11/02/2010  . Depression 11/02/2010  . Allergic rhinitis 11/02/2010    Current Outpatient Medications on File Prior to Visit  Medication Sig Dispense Refill  . levonorgestrel (MIRENA, 52 MG,) 20 MCG/24HR IUD 1 Intra Uterine Device (1 each total) by Intrauterine route once. 1 each 0   No current facility-administered medications on file prior to visit.    Past Medical History:  Diagnosis Date  . Allergy   . Anxiety   . Depression     Past Surgical History:  Procedure Laterality Date  . NO PAST SURGERIES      Social History   Socioeconomic History  . Marital status: Married    Spouse name: Not on file  . Number of children: Not on file  . Years of education: Not on file  . Highest education level: Not on file  Occupational History  . Not on file  Tobacco Use  . Smoking status: Former Smoker    Packs/day: 0.50    Quit date: 10/27/2019    Years since quitting: 0.4  . Smokeless tobacco: Never Used  Vaping Use  . Vaping Use: Never used  Substance and Sexual Activity  . Alcohol use: Yes    Comment: 10 drinks week  . Drug use: Yes    Frequency: 1.0 times per week    Types: Marijuana  .  Sexual activity: Yes    Partners: Male    Birth control/protection: I.U.D.    Comment: 1st intercourse- 13, partners- 25,  Other Topics Concern  . 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 Difficulty of Paying Living Expenses:   Food Insecurity:   . Worried About Programme researcher, broadcasting/film/video in the Last Year:   . Barista in the Last Year:   Transportation Needs:   . Freight forwarder (Medical):   Marland Kitchen Lack of Transportation (Non-Medical):   Physical Activity:   . Days of Exercise per Week:   . Minutes of Exercise per Session:   Stress:   . Feeling of Stress :   Social Connections:   . Frequency of Communication with Friends and Family:   . Frequency of Social Gatherings with Friends and Family:   . Attends Religious Services:   . Active Member of Clubs or Organizations:   . Attends Banker Meetings:   Marland Kitchen Marital  Status:     Family History  Problem Relation Age of Onset  . Alcohol abuse Mother   . Alcohol abuse Other        Whole family  . Breast cancer Other   . Lung cancer Other     Review of Systems  Constitutional: Negative for fever.  Respiratory: Negative for shortness of breath.   Cardiovascular: Negative for chest pain, palpitations and leg swelling.  Musculoskeletal: Positive for back pain. Negative for neck pain.  Neurological: Negative for weakness and numbness.  Psychiatric/Behavioral: Positive for dysphoric mood. The patient is nervous/anxious.        Objective:   Vitals:   03/31/20 1456  BP: (!) 159/94  Pulse: (!) 59  Temp: 98.3 F (36.8 C)  SpO2: 99%   BP Readings from Last 3 Encounters:  03/31/20 (!) 159/94  09/30/19 118/76  03/26/19 118/74   Wt Readings from Last 3 Encounters:  03/31/20 194 lb (88 kg)  09/30/19 196 lb  (88.9 kg)  05/01/19 195 lb (88.5 kg)   Body mass index is 30.38 kg/m.   Physical Exam    Constitutional: Appears well-developed and well-nourished. No distress.  HENT:  Head: Normocephalic and atraumatic.  Cardiovascular: Normal rate, regular rhythm and normal heart sounds.   No murmur heard. No carotid bruit .  No edema Pulmonary/Chest: Effort normal and breath sounds normal. No respiratory distress. No has no wheezes. No rales.  MSK:  No T spine tenderness or neck tenderness Neuro: normal sensation and strength b/l UE Skin: Skin is warm and dry. Not diaphoretic.  Psychiatric: Normal mood and affect. Behavior is normal.      Assessment & Plan:    See Problem List for Assessment and Plan of chronic medical problems.    This visit occurred during the SARS-CoV-2 public health emergency.  Safety protocols were in place, including screening questions prior to the visit, additional usage of staff PPE, and extensive cleaning of exam room while observing appropriate contact time as indicated for disinfecting solutions.

## 2020-03-29 NOTE — Patient Instructions (Addendum)
   Medications reviewed and updated.  Changes include :   none  Your prescription(s) have been submitted to your pharmacy. Please take as directed and contact our office if you believe you are having problem(s) with the medication(s).    Please followup in 6 months   

## 2020-03-31 ENCOUNTER — Encounter: Payer: Self-pay | Admitting: Internal Medicine

## 2020-03-31 ENCOUNTER — Other Ambulatory Visit: Payer: Self-pay

## 2020-03-31 ENCOUNTER — Ambulatory Visit (INDEPENDENT_AMBULATORY_CARE_PROVIDER_SITE_OTHER): Payer: Self-pay | Admitting: Internal Medicine

## 2020-03-31 ENCOUNTER — Ambulatory Visit: Payer: Self-pay | Admitting: Internal Medicine

## 2020-03-31 VITALS — BP 159/94 | HR 59 | Temp 98.3°F | Ht 67.0 in | Wt 194.0 lb

## 2020-03-31 DIAGNOSIS — M5412 Radiculopathy, cervical region: Secondary | ICD-10-CM

## 2020-03-31 DIAGNOSIS — M62830 Muscle spasm of back: Secondary | ICD-10-CM

## 2020-03-31 DIAGNOSIS — F3289 Other specified depressive episodes: Secondary | ICD-10-CM

## 2020-03-31 DIAGNOSIS — F411 Generalized anxiety disorder: Secondary | ICD-10-CM

## 2020-03-31 MED ORDER — DULOXETINE HCL 60 MG PO CPEP: 60.0000 mg | ORAL_CAPSULE | Freq: Every day | ORAL | 1 refills | Status: DC

## 2020-03-31 MED ORDER — CYCLOBENZAPRINE HCL 10 MG PO TABS: 10.0000 mg | ORAL_TABLET | Freq: Three times a day (TID) | ORAL | 3 refills | Status: DC | PRN

## 2020-03-31 MED ORDER — ALPRAZOLAM 0.5 MG PO TABS: 0.5000 mg | ORAL_TABLET | Freq: Three times a day (TID) | ORAL | 0 refills | Status: DC | PRN

## 2020-03-31 NOTE — Assessment & Plan Note (Signed)
Chronic, intermittent Continue flexeril prn Currently seeing a chiropractor which helps, but she has multiple muscular issues  - will refer to sports med

## 2020-03-31 NOTE — Assessment & Plan Note (Signed)
New problem Referred to sports medicine Will try using her foam roller in her back Try flexeril

## 2020-03-31 NOTE — Assessment & Plan Note (Signed)
Chronic Controlled, stable Continue current dose of medication  

## 2020-04-07 ENCOUNTER — Ambulatory Visit (INDEPENDENT_AMBULATORY_CARE_PROVIDER_SITE_OTHER): Payer: Self-pay

## 2020-04-07 ENCOUNTER — Encounter: Payer: Self-pay | Admitting: Family Medicine

## 2020-04-07 ENCOUNTER — Ambulatory Visit: Payer: Self-pay | Admitting: Family Medicine

## 2020-04-07 ENCOUNTER — Other Ambulatory Visit: Payer: Self-pay

## 2020-04-07 VITALS — BP 112/80 | HR 66 | Ht 67.0 in | Wt 196.0 lb

## 2020-04-07 DIAGNOSIS — M542 Cervicalgia: Secondary | ICD-10-CM

## 2020-04-07 DIAGNOSIS — M545 Low back pain, unspecified: Secondary | ICD-10-CM

## 2020-04-07 DIAGNOSIS — M999 Biomechanical lesion, unspecified: Secondary | ICD-10-CM

## 2020-04-07 DIAGNOSIS — M5412 Radiculopathy, cervical region: Secondary | ICD-10-CM

## 2020-04-07 DIAGNOSIS — M25552 Pain in left hip: Secondary | ICD-10-CM

## 2020-04-07 MED ORDER — GABAPENTIN 100 MG PO CAPS
200.0000 mg | ORAL_CAPSULE | Freq: Every day | ORAL | 0 refills | Status: DC
Start: 2020-04-07 — End: 2021-07-01

## 2020-04-07 MED ORDER — PREDNISONE 50 MG PO TABS
ORAL_TABLET | ORAL | 0 refills | Status: DC
Start: 2020-04-07 — End: 2020-09-29

## 2020-04-07 NOTE — Assessment & Plan Note (Signed)

## 2020-04-07 NOTE — Assessment & Plan Note (Signed)
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  X-rays of the back and hip ordered today.  Possible impingement or cam deformity needs to be ruled out.  Discussed home exercises and icing regimen.  Follow-up again in 4 to 8 weeks

## 2020-04-07 NOTE — Progress Notes (Signed)
[name] [address] [place] [address] Phone: [phone] Subjective:   Erica [name], am serving as a scribe for Dr. Antoine Primas. This visit occurred during the SARS-CoV-2 public health emergency.  Safety protocols were in place, including screening questions prior to the visit, additional usage of staff PPE, and extensive cleaning of exam room while observing appropriate contact time as indicated for disinfecting solutions.   I'm seeing this patient by the request  of:  [name], MD  CC: Neck pain, back pain, neck pain  [id number]  Erica [name] is a 39 y.o. female coming in with complaint of neck pain. Patient states that she has had pain in left shoulder and neck pain. 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 Does have radiating symptoms into left hand.   Also having left hip pain. Limited ROM in external rotation. Was told that psoas was spasming. Uses IBU for pain management. Using Flexeril which has helped hip pain.      Past Medical History:  Diagnosis Date  . Allergy   . Anxiety   . Depression    Past Surgical History:  Procedure Laterality Date  . NO PAST SURGERIES     Social History   Socioeconomic History  . Marital status: Married    Spouse name: Not on file  . Number of children: Not on file  . Years of education: Not on file  . Highest education level: Not on file  Occupational History  . Not on file  Tobacco Use  . Smoking status: Former Smoker    Packs/day: 0.50    Quit date: 10/27/2019    Years since quitting: 0.4  . Smokeless tobacco: Never Used  Vaping Use  . Vaping Use: Never used  Substance and Sexual Activity  . Alcohol use: Yes    Comment: 10 drinks week  . Drug use: Yes    Frequency: 1.0 times per week    Types: Marijuana  . Sexual activity: Yes    Partners: Male    Birth control/protection: I.U.D.    Comment: 1st intercourse- 13, partners- 25,  Other  Topics Concern  . 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 Difficulty of Paying Living Expenses:   Food Insecurity:   . Worried About Programme researcher, broadcasting/film/video in the Last Year:   . Barista in the Last Year:   Transportation Needs:   . Freight forwarder (Medical):   Marland Kitchen Lack of Transportation (Non-Medical):   Physical Activity:   . Days of Exercise per Week:   . Minutes of Exercise per Session:   Stress:   . Feeling of Stress :   Social Connections:   . Frequency of Communication with Friends and Family:   . Frequency of Social Gatherings with Friends and Family:   . Attends Religious Services:   . Active Member of Clubs or Organizations:   . Attends Banker Meetings:   Marland Kitchen Marital Status:    No Known Allergies Family History  Problem Relation Age of Onset  . Alcohol abuse Mother   . Alcohol abuse Other        Whole family  . Breast cancer Other   .  Lung cancer Other     Current Outpatient Medications (Endocrine & Metabolic):  .  levonorgestrel (MIRENA, 52 MG,) 20 MCG/24HR IUD, 1 Intra Uterine Device (1 each total) by Intrauterine route once. .  predniSONE (DELTASONE) 50 MG tablet, Take one tablet daily for the next 5 days.      Current Outpatient Medications (Other):  Marland Kitchen  ALPRAZolam (XANAX) 0.5 MG tablet, Take 1 tablet (0.5 mg total) by mouth 3 (three) times daily as needed. for anxiety .  cyclobenzaprine (FLEXERIL) 10 MG tablet, Take 1 tablet (10 mg total) by mouth 3 (three) times daily as needed for muscle spasms. .  DULoxetine (CYMBALTA) 60 MG capsule, Take 1 capsule (60 mg total) by mouth daily. Marland Kitchen  gabapentin (NEURONTIN) 100 MG capsule, Take 2 capsules (200 mg total) by mouth at bedtime.   Reviewed prior external information including notes  and imaging from  primary care provider As well as notes that were available from care everywhere and other healthcare systems.  Past medical history, social, surgical and family history all reviewed in electronic medical record.  No pertanent information unless stated regarding to the chief complaint.   Review of Systems:  No headache, visual changes, nausea, vomiting, diarrhea, constipation, dizziness, abdominal pain, skin rash, fevers, chills, night sweats, weight loss, swollen lymph nodes, , joint swelling, chest pain, shortness of breath, mood changes. POSITIVE muscle aches, body aches  Objective  Blood pressure 112/80, pulse 66, height 5\' 7"  (1.702 m), weight 196 lb (88.9 kg), SpO2 99 %.   General: No apparent distress alert and oriented x3 mood and affect normal, dressed appropriately.  HEENT: Pupils equal, extraocular movements intact  Respiratory: Patient's speak in full sentences and does not appear short of breath  Cardiovascular: No lower extremity edema, non tender, no erythema  Neuro: Cranial nerves II through XII are intact, neurovascularly intact in all extremities with 2+ DTRs and 2+ pulses.  Gait normal with good balance and coordination.  MSK:   Neck exam loss of lordosis.  Positive Spurling's on the left side with radicular symptoms in the C8 distribution.  No significant weakness though with grip strength.  Deep tendon reflexes including triceps seems to be intact.  Left hip does have some decrease in external range of motion compared to the contralateral side.  Near full internal range of motion bilaterally no.  Negative straight leg test but some mild tenderness in the low back.  Osteopathic findings C2 flexed rotated and side bent left C7 flexed rotated and side bent left T3 extended rotated and side bent right inhaled third rib L2 flexed rotated and side bent right Sacrum right on right  [id number]; 15 additional minutes spent for Therapeutic exercises as stated in  above notes.  This included exercises focusing on stretching, strengthening, with significant focus on eccentric aspects.   Long term goals include an improvement in range of motion, strength, endurance as well as avoiding reinjury. Patient's frequency would include in 1-2 times a day, 3-5 times a week for a duration of 6-12 weeks.Exercises that included:  Basic scapular stabilization to include adduction and depression of scapula Scaption, focusing on proper movement and good control Internal and External rotation utilizing a theraband, with elbow tucked at side entire time Rows with theraband    Proper technique shown and discussed handout in great detail with ATC.  All questions were discussed and answered.     Impression and Recommendations:     The above documentation has been reviewed and is accurate and  complete [name], DO       Note: This dictation was prepared with Dragon dictation along with smaller phrase technology. Any transcriptional errors that result from this process are unintentional.

## 2020-04-07 NOTE — Patient Instructions (Addendum)
Xray today Prednisone Gabapentin Vit D 2000IU See me in 4 weeks

## 2020-04-07 NOTE — Assessment & Plan Note (Signed)
Patient does have more of a cervical radiculopathy that does seem to be going down the left arm in the C8 distribution.  Patient responded fairly well to osteopathic manipulation.  Patient does have Flexeril, started gabapentin, continue this Cymbalta.  Short course of prednisone.  X-rays pending.  Consider formal physical therapy.  Follow-up again in 4 to 8 weeks

## 2020-04-15 ENCOUNTER — Ambulatory Visit (INDEPENDENT_AMBULATORY_CARE_PROVIDER_SITE_OTHER): Payer: Self-pay | Admitting: Psychology

## 2020-04-15 DIAGNOSIS — F411 Generalized anxiety disorder: Secondary | ICD-10-CM

## 2020-05-11 ENCOUNTER — Ambulatory Visit: Payer: Self-pay | Admitting: Psychology

## 2020-05-20 ENCOUNTER — Encounter: Payer: Self-pay | Admitting: Family Medicine

## 2020-05-20 ENCOUNTER — Ambulatory Visit: Payer: Self-pay | Admitting: Family Medicine

## 2020-05-20 ENCOUNTER — Other Ambulatory Visit: Payer: Self-pay

## 2020-05-20 VITALS — BP 108/64 | HR 67 | Ht 67.0 in | Wt 199.0 lb

## 2020-05-20 DIAGNOSIS — M999 Biomechanical lesion, unspecified: Secondary | ICD-10-CM

## 2020-05-20 DIAGNOSIS — M5412 Radiculopathy, cervical region: Secondary | ICD-10-CM

## 2020-05-20 MED ORDER — MELOXICAM 15 MG PO TABS
15.0000 mg | ORAL_TABLET | Freq: Every day | ORAL | 0 refills | Status: DC
Start: 2020-05-20 — End: 2020-09-29

## 2020-05-20 NOTE — Assessment & Plan Note (Signed)
Patient's radicular symptoms are significantly improved at this time.  Discussed gabapentin and the icing regimen.  Discussed meloxicam for breakthrough.  Patient will continue with the duloxetine and I think can help with some of the pain as well.  Increase activity slowly.  Follow-up with me again 8 weeks

## 2020-05-20 NOTE — Patient Instructions (Signed)
Meloxicam daily for 3-5 days with exacerbation See me in 7-8 weeks

## 2020-05-20 NOTE — Progress Notes (Signed)
[name] [address] [place] [address] Phone: [phone] Subjective:   Erica [name], am serving as a scribe for Dr. Antoine Primas. This visit occurred during the SARS-CoV-2 public health emergency.  Safety protocols were in place, including screening questions prior to the visit, additional usage of staff PPE, and extensive cleaning of exam room while observing appropriate contact time as indicated for disinfecting solutions.   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  X-rays of the back and hip ordered today.  Possible impingement or cam deformity needs to be ruled out.  Discussed home exercises and icing regimen.  Follow-up again in 4 to 8 weeks  Update 05/20/2020 Erica [name] is a 39 y.o. female coming in with complaint of left hip pain and OMT for cervical spine pain. Patient states that she   Medications patient has been prescribed: Gabapentin  Taking: Yes         Reviewed prior external information including notes and imaging from previsou exam, outside providers and external EMR if available.   As well as notes that were available from care everywhere and other healthcare systems.  Past medical history, social, surgical and family history all reviewed in electronic medical record.  No pertanent information unless stated regarding to the chief complaint.   Past Medical History:  Diagnosis Date  . Allergy   . Anxiety   . Depression     No Known Allergies   Review of Systems:  No headache, visual changes, nausea, vomiting, diarrhea, constipation, dizziness, abdominal pain, skin rash, fevers, chills, night sweats, weight loss, swollen lymph nodes, body aches, joint swelling, chest pain, shortness of breath, mood changes. POSITIVE muscle aches  Objective  Blood pressure  108/64, pulse 67, height 5\' 7"  (1.702 m), weight 199 lb (90.3 kg), SpO2 99 %.   General: No apparent distress alert and oriented x3 mood and affect normal, dressed appropriately.  HEENT: Pupils equal, extraocular movements intact  Respiratory: Patient's speak in full sentences and does not appear short of breath  Cardiovascular: No lower extremity edema, non tender, no erythema  Neuro: Cranial nerves II through XII are intact, neurovascularly intact in all extremities with 2+ DTRs and 2+ pulses.  Gait normal with good balance and coordination.  MSK:  Non tender with full range of motion and good stability and symmetric strength and tone of shoulders, elbows, wrist, hip, knee and ankles bilaterally.  Back - Normal skin, Spine with normal alignment and no deformity.  No tenderness to vertebr low back exam does show some tenderness to palpation in the paraspinal musculature of the lumbar spine, right greater than left.  Negative straight leg test. Neck exam does show some mild loss of lordosis.  Negative Spurling's today.  Still tightness in the left parascapular region.  Osteopathic findings  C6 flexed rotated and side bent left T4 extended rotated and side bent left inhaled rib L1 flexed rotated and side bent right Sacrum right on right       Assessment and Plan:    Cervical radiculopathy at C8 Patient's radicular symptoms are significantly improved at this time.  Discussed gabapentin and the icing regimen.  Discussed meloxicam for breakthrough.  Patient will continue with the duloxetine and I think can help with some of the pain as well.  Increase  activity slowly.  Follow-up with me again 8 weeks    Nonallopathic problems  Decision today to treat with OMT was based on Physical Exam  After verbal consent patient was treated with HVLA, ME, FPR techniques in cervical, rib, thoracic, lumbar, and sacral  areas  Patient tolerated the procedure well with improvement in  symptoms  Patient given exercises, stretches and lifestyle modifications  See medications in patient instructions if given  Patient will follow up in 4-8 weeks      The above documentation has been reviewed and is accurate and complete Judi Saa, DO       Note: This dictation was prepared with Dragon dictation along with smaller phrase technology. Any transcriptional errors that result from this process are unintentional.

## 2020-06-03 ENCOUNTER — Ambulatory Visit (INDEPENDENT_AMBULATORY_CARE_PROVIDER_SITE_OTHER): Payer: Self-pay | Admitting: Psychology

## 2020-06-03 DIAGNOSIS — F411 Generalized anxiety disorder: Secondary | ICD-10-CM

## 2020-06-05 ENCOUNTER — Encounter: Payer: Self-pay | Admitting: Obstetrics & Gynecology

## 2020-06-05 ENCOUNTER — Ambulatory Visit (INDEPENDENT_AMBULATORY_CARE_PROVIDER_SITE_OTHER): Payer: Medicaid Other | Admitting: Obstetrics & Gynecology

## 2020-06-05 ENCOUNTER — Other Ambulatory Visit: Payer: Self-pay

## 2020-06-05 VITALS — BP 140/90 | Ht 67.0 in | Wt 199.0 lb

## 2020-06-05 DIAGNOSIS — Z6831 Body mass index (BMI) 31.0-31.9, adult: Secondary | ICD-10-CM

## 2020-06-05 DIAGNOSIS — Z23 Encounter for immunization: Secondary | ICD-10-CM

## 2020-06-05 DIAGNOSIS — Z30431 Encounter for routine checking of intrauterine contraceptive device: Secondary | ICD-10-CM | POA: Diagnosis not present

## 2020-06-05 DIAGNOSIS — Z01419 Encounter for gynecological examination (general) (routine) without abnormal findings: Secondary | ICD-10-CM

## 2020-06-05 DIAGNOSIS — E6609 Other obesity due to excess calories: Secondary | ICD-10-CM

## 2020-06-05 NOTE — Progress Notes (Signed)
Erica [name] 16-Jul-1981 [id number]   History:    39 y.o. G1P1L1 Divorced.  Boyfriend x 1 year.  Daughter is 65 1/2 yo.  RP:  Established patient presenting for annual gyn exam   HPI: Well on Liletta IUD since December 2017.  No breakthrough bleeding.  No pelvic pain.  No pain with intercourse.  Not always using condoms.  Breasts normal.  Urine and bowel movements normal.  Body mass index 31.17.  Good fitness and healthy nutrition.  Stopped smoking x 8 months.  Health labs with family physician.   Past medical history,surgical history, family history and social history were all reviewed and documented in the EPIC chart.  Gynecologic History No LMP recorded. (Menstrual status: IUD).  Obstetric History OB History  Gravida Para Term Preterm AB Living  1 1 1  0 0 1  SAB TAB Ectopic Multiple Live Births  0 0 0 0 1    # Outcome Date GA Lbr Len/2nd Weight Sex Delivery Anes PTL Lv  1 Term 08/29/12 [redacted]w[redacted]d 06:22 / 00:14 7 lb 1.8 oz (3.226 kg) F Vag-Spont Local  LIV     ROS: A ROS was performed and pertinent positives and negatives are included in the history.  GENERAL: No fevers or chills. HEENT: No change in vision, no earache, sore throat or sinus congestion. NECK: No pain or stiffness. CARDIOVASCULAR: No chest pain or pressure. No palpitations. PULMONARY: No shortness of breath, cough or wheeze. GASTROINTESTINAL: No abdominal pain, nausea, vomiting or diarrhea, melena or bright red blood per rectum. GENITOURINARY: No urinary frequency, urgency, hesitancy or dysuria. MUSCULOSKELETAL: No joint or muscle pain, no back pain, no recent trauma. DERMATOLOGIC: No rash, no itching, no lesions. ENDOCRINE: No polyuria, polydipsia, no heat or cold intolerance. No recent change in weight. HEMATOLOGICAL: No anemia or easy bruising or bleeding. NEUROLOGIC: No headache, seizures, numbness, tingling or weakness. PSYCHIATRIC: No depression, no loss of interest in normal activity or change in sleep pattern.       Exam:   BP 140/90   Ht 5\' 7"  (1.702 m)   Wt 199 lb (90.3 kg)   BMI 31.17 kg/m   Body mass index is 31.17 kg/m.  General appearance : Well developed well nourished female. No acute distress HEENT: Eyes: no retinal hemorrhage or exudates,  Neck supple, trachea midline, no carotid bruits, no thyroidmegaly Lungs: Clear to auscultation, no rhonchi or wheezes, or rib retractions  Heart: Regular rate and rhythm, no murmurs or gallops Breast:Examined in sitting and supine position were symmetrical in appearance, no palpable masses or tenderness,  no skin retraction, no nipple inversion, no nipple discharge, no skin discoloration, no axillary or supraclavicular lymphadenopathy Abdomen: no palpable masses or tenderness, no rebound or guarding Extremities: no edema or skin discoloration or tenderness  Pelvic: Vulva: Normal             Vagina: No gross lesions or discharge  Cervix: No gross lesions or discharge.  IUD strings felt on bimanual exam.  Uterus  AV, normal size, shape and consistency, non-tender and mobile  Adnexa  Without masses or tenderness  Anus: Normal   Assessment/Plan:  39 y.o. female for annual exam   1. Well female exam with routine gynecological exam Normal gynecologic exam.  Pap test August 2020 negative, with negative high-risk HPV, will repeat Pap test next year.  Breast exam normal.  Will schedule a screening mammogram at the breast center now.  Health labs with family physician.  2. Encounter for routine checking  of intrauterine contraceptive device (IUD) Liletta IUD since December 2017, well-tolerated and in good position.  3. Class 1 obesity due to excess calories without serious comorbidity with body mass index (BMI) of 31.0 to 31.9 in adult Recommend a lower calorie/carb diet.  Continue with fitness activities.  Genia Del MD, 2:14 PM 06/05/2020

## 2020-06-05 NOTE — Addendum Note (Signed)
Addended by: Berna Spare A on: 06/05/2020 02:45 PM   Modules accepted: Orders

## 2020-07-08 ENCOUNTER — Other Ambulatory Visit: Payer: Self-pay | Admitting: Internal Medicine

## 2020-07-08 ENCOUNTER — Ambulatory Visit (INDEPENDENT_AMBULATORY_CARE_PROVIDER_SITE_OTHER): Payer: Medicaid Other | Admitting: Family Medicine

## 2020-07-08 ENCOUNTER — Other Ambulatory Visit: Payer: Self-pay

## 2020-07-08 ENCOUNTER — Encounter: Payer: Self-pay | Admitting: Family Medicine

## 2020-07-08 VITALS — BP 120/90 | HR 57 | Ht 67.0 in | Wt 197.0 lb

## 2020-07-08 DIAGNOSIS — M999 Biomechanical lesion, unspecified: Secondary | ICD-10-CM

## 2020-07-08 DIAGNOSIS — M25552 Pain in left hip: Secondary | ICD-10-CM

## 2020-07-08 MED ORDER — MELOXICAM 15 MG PO TABS
15.0000 mg | ORAL_TABLET | Freq: Every day | ORAL | 0 refills | Status: DC
Start: 2020-07-08 — End: 2020-12-22

## 2020-07-08 NOTE — Telephone Encounter (Signed)
Error

## 2020-07-08 NOTE — Patient Instructions (Signed)
Good to see you Refilled meloxicam take it in 5 days burst if needed Keep up everything else See me again in 8-10 weeks

## 2020-07-08 NOTE — Progress Notes (Signed)
[name] [address] [place] [address] Phone: [phone] Subjective:   I Ronelle Nigh am serving as a Neurosurgeon for Dr. Antoine Primas.  This visit occurred during the SARS-CoV-2 public health emergency.  Safety protocols were in place, including screening questions prior to the visit, additional usage of staff PPE, and extensive cleaning of exam room while observing appropriate contact time as indicated for disinfecting solutions.   I'm seeing this patient by the request  of:  [name], MD  CC: Neck and back pain follow-up  [id number]  Erica [name] is a 39 y.o. female coming in with complaint of back and neck pain. OMT 05/20/2020. Patient states she is doing well. Neck and shoulders are a lot better. The psoas is doing better. Antiinflammatories helped with pain.   Medications patient has been prescribed: Gabapentin  Taking: Intermittently         Reviewed prior external information including notes and imaging from previsou exam, outside providers and external EMR if available.   As well as notes that were available from care everywhere and other healthcare systems.  Past medical history, social, surgical and family history all reviewed in electronic medical record.  No pertanent information unless stated regarding to the chief complaint.   Past Medical History:  Diagnosis Date   Allergy    Anxiety    Depression     No Known Allergies   Review of Systems:  No headache, visual changes, nausea, vomiting, diarrhea, constipation, dizziness, abdominal pain, skin rash, fevers, chills, night sweats, weight loss, swollen lymph nodes, body aches, joint swelling, chest pain, shortness of breath, mood changes. POSITIVE muscle aches  Objective  Blood pressure 120/90, pulse (!) 57, height 5\' 7"  (1.702 m), weight 197 lb (89.4 kg), SpO2 98 %.   General: No apparent distress alert and oriented x3 mood and affect normal, dressed  appropriately.  HEENT: Pupils equal, extraocular movements intact  Respiratory: Patient's speak in full sentences and does not appear short of breath  Cardiovascular: No lower extremity edema, non tender, no erythema  Back -back exam does show that patient continues to have tightness in the paraspinal musculature on the left side of the back.  Mild positive left positive .  Patient does have pain now in currently in the hip with internal rotation.  Osteopathic findings  C6 flexed rotated and side bent left T3 extended rotated and side bent right inhaled rib L2 flexed rotated and side bent left Sacrum right on left       Assessment and Plan:  Left hip pain Patient does have some mild decrease in range of motion.  Responding well to the manipulation and the home exercises.  Increase the gabapentin.  Patient has meloxicam for the breakthrough and can do in 5-day burst.  Discussed the muscle relaxer as needed.  Patient is making progress and will follow up with me again in 2 months    Nonallopathic problems  Decision today to treat with OMT was based on Physical Exam  After verbal consent patient was treated with HVLA, ME, FPR techniques in cervical, rib, thoracic, lumbar, and sacral  areas  Patient tolerated the procedure well with improvement in symptoms  Patient given exercises, stretches and lifestyle modifications  See medications in patient instructions if given  Patient will follow up in 8 weeks      The above documentation has been reviewed and is accurate and complete Pearlean Brownie, DO  Note: This dictation was prepared with Dragon dictation along with smaller phrase technology. Any transcriptional errors that result from this process are unintentional.

## 2020-07-08 NOTE — Assessment & Plan Note (Signed)
Patient does have some mild decrease in range of motion.  Responding well to the manipulation and the home exercises.  Increase the gabapentin.  Patient has meloxicam for the breakthrough and can do in 5-day burst.  Discussed the muscle relaxer as needed.  Patient is making progress and will follow up with me again in 2 months

## 2020-07-22 ENCOUNTER — Ambulatory Visit: Payer: Self-pay | Admitting: Psychology

## 2020-08-25 ENCOUNTER — Other Ambulatory Visit: Payer: Self-pay | Admitting: *Deleted

## 2020-08-25 DIAGNOSIS — Z1231 Encounter for screening mammogram for malignant neoplasm of breast: Secondary | ICD-10-CM

## 2020-08-31 ENCOUNTER — Ambulatory Visit (INDEPENDENT_AMBULATORY_CARE_PROVIDER_SITE_OTHER): Payer: Self-pay | Admitting: Psychology

## 2020-08-31 DIAGNOSIS — F411 Generalized anxiety disorder: Secondary | ICD-10-CM

## 2020-09-15 ENCOUNTER — Ambulatory Visit: Payer: Medicaid Other | Admitting: Family Medicine

## 2020-09-24 ENCOUNTER — Telehealth: Payer: Self-pay | Admitting: Internal Medicine

## 2020-09-24 NOTE — Telephone Encounter (Signed)
Patient called and said she is now on [facility] insurance and was wondering if Dr. Lawerance Bach could recommend any doctors that she may know that accept this insurance. Please call [phone].

## 2020-09-25 ENCOUNTER — Other Ambulatory Visit: Payer: Self-pay | Admitting: Internal Medicine

## 2020-09-28 NOTE — Telephone Encounter (Signed)
She can try [facility] - I do not have a specific doctor to recommend - I am not sure who is accepting new pts.

## 2020-09-29 MED ORDER — ALPRAZOLAM 0.5 MG PO TABS: 0.5000 mg | ORAL_TABLET | Freq: Three times a day (TID) | ORAL | 0 refills | Status: DC | PRN

## 2020-09-29 NOTE — Telephone Encounter (Signed)
Xanax sent to pharmacy.  

## 2020-10-01 ENCOUNTER — Ambulatory Visit: Payer: Self-pay | Admitting: Internal Medicine

## 2020-10-01 NOTE — Progress Notes (Signed)
[name] [address] [place] [address] Phone: [phone] Subjective:   Bruce Donath, am serving as a scribe for Dr. Antoine Primas. This visit occurred during the SARS-CoV-2 public health emergency.  Safety protocols were in place, including screening questions prior to the visit, additional usage of staff PPE, and extensive cleaning of exam room while observing appropriate contact time as indicated for disinfecting solutions.   I'm seeing this patient by the request  of:  [name], MD  CC: Back, neck and hip pain follow-up  [id number]  Marien Golonka is a 40 y.o. female coming in with complaint of back and neck pain. OMT 07/08/2020. Patient states that she notes pain increasing with lack of stretching. Has not been using gabapentin and meloxicam.  Patient was able to go skiing which is the first time in 25 years.  Had no significant discomfort in the hip.  Still having only pain when she internally rotates it.  Patient states neck and everything else seems to be doing relatively well  Medications patient has been prescribed: Meloxicam, Gabapentin  Taking: Not taking the gabapentin and only taking the meloxicam intermittently         Reviewed prior external information including notes and imaging from previsou exam, outside providers and external EMR if available.   As well as notes that were available from care everywhere and other healthcare systems.  Past medical history, social, surgical and family history all reviewed in electronic medical record.  No pertanent information unless stated regarding to the chief complaint.   Past Medical History:  Diagnosis Date  . Allergy   . Anxiety   . Depression     No Known Allergies   Review of Systems:  No headache, visual changes, nausea, vomiting, diarrhea, constipation, dizziness, abdominal pain, skin rash, fevers, chills, night sweats, weight loss, swollen lymph nodes, body  aches, joint swelling, chest pain, shortness of breath, mood changes. POSITIVE muscle aches  Objective  Blood pressure 120/84, pulse (!) 50, height 5\' 7"  (1.702 m), weight 200 lb (90.7 kg), SpO2 99 %.   General: No apparent distress alert and oriented x3 mood and affect normal, dressed appropriately.  HEENT: Pupils equal, extraocular movements intact  Respiratory: Patient's speak in full sentences and does not appear short of breath  Cardiovascular: No lower extremity edema, non tender, no erythema  Neuro: Cranial nerves II through XII are intact, neurovascularly intact in all extremities with 2+ DTRs and 2+ pulses.  Gait normal with good balance and coordination.  MSK:  Non tender with full range of motion and good stability and symmetric strength and tone of shoulders, elbows, wrist, hip, knee and ankles bilaterally.  Back -back exam does have some mild loss of lordosis.  Patient still has some pain with internal rotation of the hips are very mild.  Still mild tightness in the parascapular region right greater than left.  Osteopathic findings  C5 flexed rotated and side bent left T5 extended rotated and side bent right inhaled rib T8 extended rotated and side bent left L2 flexed rotated and side bent right Sacrum left on left     Assessment and Plan:  Left hip pain Patient overall is doing significantly better.  Still has some mild signs that is consistent with a potential labral tear but is doing well.  Patient does have the meloxicam and uses it intermittently mild burst.  Not taking any other medicine for the pain at this time.  Encourage  patient to increase activity slowly.  Can follow-up with me again in 3 months    Nonallopathic problems  Decision today to treat with OMT was based on Physical Exam  After verbal consent patient was treated with HVLA, ME, FPR techniques in cervical, rib, thoracic, lumbar, and sacral  areas  Patient tolerated the procedure well with  improvement in symptoms  Patient given exercises, stretches and lifestyle modifications  See medications in patient instructions if given  Patient will follow up in 4-8 weeks      The above documentation has been reviewed and is accurate and complete Judi Saa, DO       Note: This dictation was prepared with Dragon dictation along with smaller phrase technology. Any transcriptional errors that result from this process are unintentional.

## 2020-10-02 ENCOUNTER — Ambulatory Visit (INDEPENDENT_AMBULATORY_CARE_PROVIDER_SITE_OTHER): Payer: Self-pay | Admitting: Family Medicine

## 2020-10-02 ENCOUNTER — Other Ambulatory Visit: Payer: Self-pay

## 2020-10-02 ENCOUNTER — Encounter: Payer: Self-pay | Admitting: Family Medicine

## 2020-10-02 VITALS — BP 120/84 | HR 50 | Ht 67.0 in | Wt 200.0 lb

## 2020-10-02 DIAGNOSIS — M999 Biomechanical lesion, unspecified: Secondary | ICD-10-CM

## 2020-10-02 DIAGNOSIS — M25552 Pain in left hip: Secondary | ICD-10-CM

## 2020-10-02 MED ORDER — MELOXICAM 15 MG PO TABS
15.0000 mg | ORAL_TABLET | Freq: Every day | ORAL | 0 refills | Status: DC
Start: 2020-10-02 — End: 2020-12-22

## 2020-10-02 NOTE — Assessment & Plan Note (Signed)
Patient overall is doing significantly better.  Still has some mild signs that is consistent with a potential labral tear but is doing well.  Patient does have the meloxicam and uses it intermittently mild burst.  Not taking any other medicine for the pain at this time.  Encourage patient to increase activity slowly.  Can follow-up with me again in 3 months

## 2020-10-02 NOTE — Patient Instructions (Signed)
Meloxicam refill Take in 3-5 day bursts No other big changes You are doing great See me in 2-3 months.

## 2020-10-08 ENCOUNTER — Ambulatory Visit (INDEPENDENT_AMBULATORY_CARE_PROVIDER_SITE_OTHER): Payer: Self-pay | Admitting: Psychology

## 2020-10-08 DIAGNOSIS — F411 Generalized anxiety disorder: Secondary | ICD-10-CM

## 2020-11-12 ENCOUNTER — Ambulatory Visit (INDEPENDENT_AMBULATORY_CARE_PROVIDER_SITE_OTHER): Payer: Self-pay | Admitting: Psychology

## 2020-11-12 DIAGNOSIS — F411 Generalized anxiety disorder: Secondary | ICD-10-CM

## 2020-12-02 ENCOUNTER — Other Ambulatory Visit: Payer: Self-pay | Admitting: Internal Medicine

## 2020-12-17 NOTE — Progress Notes (Signed)
[name] [address] [place] [address] Phone: [phone] Subjective:   Erica [name], am serving as a scribe for Dr. Antoine Primas.\ This visit occurred during the SARS-CoV-2 public health emergency.  Safety protocols were in place, including screening questions prior to the visit, additional usage of staff PPE, and extensive cleaning of exam room while observing appropriate contact time as indicated for disinfecting solutions.  I'm seeing this patient by the request  of:  [name], MD  CC: Hip pain, back pain follow-up  [id number]  Erica [name] is a 40 y.o. female coming in with complaint of back and neck pain. OMT 10/02/2020. Patient states that pain in left arm has subsided. Patient states that she has some numbness in right forearm.   L hip is not improving. Notes that she is able to move but has swelling and pain. Pain increases with hip IR, ER, extension.  Patient feels like it is affecting daily activities.  There is anything she feels like she is worsening somewhat.  Feels like the medication she does take only helps a little bit.  Patient though does not take them very regularly.  Also mentions pain over lateral aspect of L knee with knee bending for past 2 weeks.   Medications patient has been prescribed: Meloxicam, Gabapentin  Taking: Intermittently         Reviewed prior external information including notes and imaging from previsou exam, outside providers and external EMR if available.   As well as notes that were available from care everywhere and other healthcare systems.  Past medical history, social, surgical and family history all reviewed in electronic medical record.  No pertanent information unless stated regarding to the chief complaint.   Past Medical History:  Diagnosis Date  . Allergy   . Anxiety   . Depression     No Known Allergies   Review of Systems:  No headache, visual changes, nausea,  vomiting, diarrhea, constipation, dizziness, abdominal pain, skin rash, fevers, chills, night sweats, weight loss, swollen lymph nodes, body aches, joint swelling, chest pain, shortness of breath, mood changes. POSITIVE muscle aches  Objective  Blood pressure 122/88, pulse 64, height 5\' 7"  (1.702 m), weight 199 lb (90.3 kg), SpO2 99 %.   General: No apparent distress alert and oriented x3 mood and affect normal, dressed appropriately.  HEENT: Pupils equal, extraocular movements intact  Respiratory: Patient's speak in full sentences and does not appear short of breath  Cardiovascular: No lower extremity edema, non tender, no erythema  Gait normal with good balance and coordination.  MSK: Left hip exam does have limited range of motion in internal and external range of motion.  Positive grind test noted. Back -low back exam does have some mild loss of lordosis.  Patient has some tenderness to palpation in the paraspinal musculature left greater than right.  Tightness with on the left side.  Osteopathic findings  C6 flexed rotated and side bent left T3 extended rotated and side bent right inhaled rib T9 extended rotated and side bent left L2 flexed rotated and side bent right Sacrum left on left       Assessment and Plan:  Left hip pain Continues to have left hip pain.  Still concern for potential labral pathology.  Does have some limited range of motion noted today that seems to be over that worsening.  Affecting daily activities.  Patient feels like over the last 10 months since we have been  treating this seems to be potentially even worsening somewhat.  Patient though has been somewhat noncompliant with taking the medicine regularly.  Has done physical therapy as well as been doing home exercises.  I do feel at this point we will consider the possibility of a MR arthrogram of the hip for further duration.  Patient would consider surgical intervention if necessary.     Nonallopathic problems  Decision today to treat with OMT was based on Physical Exam  After verbal consent patient was treated with HVLA, ME, FPR techniques in cervical, rib, thoracic, lumbar, and sacral  areas  Patient tolerated the procedure well with improvement in symptoms  Patient given exercises, stretches and lifestyle modifications  See medications in patient instructions if given  Patient will follow up in 4-8 weeks      The above documentation has been reviewed and is accurate and complete Judi Saa, DO       Note: This dictation was prepared with Dragon dictation along with smaller phrase technology. Any transcriptional errors that result from this process are unintentional.

## 2020-12-22 ENCOUNTER — Other Ambulatory Visit: Payer: Self-pay

## 2020-12-22 ENCOUNTER — Ambulatory Visit (INDEPENDENT_AMBULATORY_CARE_PROVIDER_SITE_OTHER): Payer: Self-pay | Admitting: Psychology

## 2020-12-22 ENCOUNTER — Encounter: Payer: Self-pay | Admitting: Family Medicine

## 2020-12-22 ENCOUNTER — Ambulatory Visit (INDEPENDENT_AMBULATORY_CARE_PROVIDER_SITE_OTHER): Payer: Self-pay | Admitting: Family Medicine

## 2020-12-22 VITALS — BP 122/88 | HR 64 | Ht 67.0 in | Wt 199.0 lb

## 2020-12-22 DIAGNOSIS — M999 Biomechanical lesion, unspecified: Secondary | ICD-10-CM

## 2020-12-22 DIAGNOSIS — M25552 Pain in left hip: Secondary | ICD-10-CM

## 2020-12-22 DIAGNOSIS — F411 Generalized anxiety disorder: Secondary | ICD-10-CM

## 2020-12-22 MED ORDER — MELOXICAM 15 MG PO TABS
15.0000 mg | ORAL_TABLET | Freq: Every day | ORAL | 0 refills | Status: DC
Start: 2020-12-22 — End: 2021-03-15

## 2020-12-22 NOTE — Assessment & Plan Note (Signed)
Continues to have left hip pain.  Still concern for potential labral pathology.  Does have some limited range of motion noted today that seems to be over that worsening.  Affecting daily activities.  Patient feels like over the last 10 months since we have been treating this seems to be potentially even worsening somewhat.  Patient though has been somewhat noncompliant with taking the medicine regularly.  Has done physical therapy as well as been doing home exercises.  I do feel at this point we will consider the possibility of a MR arthrogram of the hip for further duration.  Patient would consider surgical intervention if necessary.

## 2020-12-22 NOTE — Patient Instructions (Addendum)
MRA left hip [phone] Meloxicam daily for 10 days then as needed Consider gabapentin at night Have appt in 6-8 weeks

## 2020-12-28 ENCOUNTER — Other Ambulatory Visit: Payer: Self-pay | Admitting: Internal Medicine

## 2021-01-07 ENCOUNTER — Ambulatory Visit (INDEPENDENT_AMBULATORY_CARE_PROVIDER_SITE_OTHER): Payer: Self-pay | Admitting: Psychology

## 2021-01-07 DIAGNOSIS — F411 Generalized anxiety disorder: Secondary | ICD-10-CM

## 2021-02-01 NOTE — Progress Notes (Signed)
[name] [address] [place] [address] Phone: [phone] Subjective:   Erica [name], am serving as a scribe for Dr. Antoine Primas. This visit occurred during the SARS-CoV-2 public health emergency.  Safety protocols were in place, including screening questions prior to the visit, additional usage of staff PPE, and extensive cleaning of exam room while observing appropriate contact time as indicated for disinfecting solutions.   I'm seeing this patient by the request  of:  [name], MD  CC: Low back and hip pain follow-up  [id number]  Erica [name] is a 40 y.o. female coming in with complaint of back and neck pain. OMT 12/22/2020. Patient states that both her back and hip have improved. Did not get MRI due to financial restraints. Has been doing more foam rolling and isometric strengthening. Used 10 days of meloixcam during a 2 week break from working out.   Medications patient has been prescribed: Meloxicam        Reviewed prior external information including notes and imaging from previsou exam, outside providers and external EMR if available.   As well as notes that were available from care everywhere and other healthcare systems.  Past medical history, social, surgical and family history all reviewed in electronic medical record.  No pertanent information unless stated regarding to the chief complaint.   Past Medical History:  Diagnosis Date  . Allergy   . Anxiety   . Depression     No Known Allergies   Review of Systems:  No headache, visual changes, nausea, vomiting, diarrhea, constipation, dizziness, abdominal pain, skin rash, fevers, chills, night sweats, weight loss, swollen lymph nodes, body aches, joint swelling, chest pain, shortness of breath, mood changes. POSITIVE muscle aches  Objective  Blood pressure 110/78, pulse 68, height 5\' 7"  (1.702 m), weight 197 lb (89.4 kg), SpO2 98 %.   General: No apparent  distress alert and oriented x3 mood and affect normal, dressed appropriately.  HEENT: Pupils equal, extraocular movements intact  Respiratory: Patient's speak in full sentences and does not appear short of breath  Cardiovascular: No lower extremity edema, non tender, no erythema  Gait normal with good balance and coordination.  MSK:  Non tender with full range of motion and good stability and symmetric strength and tone of shoulders, elbows, wrist, hip, knee and ankles bilaterally.  Back -  Osteopathic findings  C6 flexed rotated and side bent left T3 extended rotated and side bent right inhaled rib T8 extended rotated and side bent left L2 flexed rotated and side bent right Sacrum right on right       Assessment and Plan: Left hip pain Patient states that overall doing relatively well.  Wants to continue with conservative therapy.  We have discussed the possibility of the MRI previously but patient due to financial constraints is unable to get it at the moment.  Discussed icing regimen and home exercises.  Has meloxicam and continue to use that as breakthrough pain and does have the gabapentin and can use that as needed.  Follow-up with me again 2 months     Nonallopathic problems  Decision today to treat with OMT was based on Physical Exam  After verbal consent patient was treated with HVLA, ME, FPR techniques in cervical, rib, thoracic, lumbar, and sacral  areas  Patient tolerated the procedure well with improvement in symptoms  Patient given exercises, stretches and lifestyle modifications  See medications in patient instructions if given  Patient will  follow up in 4-8 weeks      The above documentation has been reviewed and is accurate and complete Judi Saa, DO       Note: This dictation was prepared with Dragon dictation along with smaller phrase technology. Any transcriptional errors that result from this process are unintentional.

## 2021-02-02 ENCOUNTER — Other Ambulatory Visit: Payer: Self-pay

## 2021-02-02 ENCOUNTER — Encounter: Payer: Self-pay | Admitting: Family Medicine

## 2021-02-02 ENCOUNTER — Ambulatory Visit (INDEPENDENT_AMBULATORY_CARE_PROVIDER_SITE_OTHER): Payer: Self-pay | Admitting: Family Medicine

## 2021-02-02 VITALS — BP 110/78 | HR 68 | Ht 67.0 in | Wt 197.0 lb

## 2021-02-02 DIAGNOSIS — M9903 Segmental and somatic dysfunction of lumbar region: Secondary | ICD-10-CM

## 2021-02-02 DIAGNOSIS — M9902 Segmental and somatic dysfunction of thoracic region: Secondary | ICD-10-CM

## 2021-02-02 DIAGNOSIS — M9901 Segmental and somatic dysfunction of cervical region: Secondary | ICD-10-CM

## 2021-02-02 DIAGNOSIS — M25552 Pain in left hip: Secondary | ICD-10-CM

## 2021-02-02 DIAGNOSIS — M9908 Segmental and somatic dysfunction of rib cage: Secondary | ICD-10-CM

## 2021-02-02 DIAGNOSIS — M9904 Segmental and somatic dysfunction of sacral region: Secondary | ICD-10-CM

## 2021-02-02 NOTE — Patient Instructions (Signed)
Great to see you Overall no changes Gabapentin when you need it See me in 2 months

## 2021-02-02 NOTE — Assessment & Plan Note (Signed)
Patient states that overall doing relatively well.  Wants to continue with conservative therapy.  We have discussed the possibility of the MRI previously but patient due to financial constraints is unable to get it at the moment.  Discussed icing regimen and home exercises.  Has meloxicam and continue to use that as breakthrough pain and does have the gabapentin and can use that as needed.  Follow-up with me again 2 months

## 2021-02-05 ENCOUNTER — Ambulatory Visit: Payer: Self-pay | Admitting: Psychology

## 2021-02-08 ENCOUNTER — Other Ambulatory Visit: Payer: Self-pay | Admitting: Internal Medicine

## 2021-02-08 ENCOUNTER — Ambulatory Visit (INDEPENDENT_AMBULATORY_CARE_PROVIDER_SITE_OTHER): Payer: Self-pay | Admitting: Psychology

## 2021-02-08 DIAGNOSIS — F411 Generalized anxiety disorder: Secondary | ICD-10-CM

## 2021-03-04 ENCOUNTER — Other Ambulatory Visit: Payer: Self-pay

## 2021-03-04 ENCOUNTER — Encounter: Payer: Medicaid Other | Admitting: Internal Medicine

## 2021-03-14 ENCOUNTER — Other Ambulatory Visit: Payer: Self-pay | Admitting: Family Medicine

## 2021-03-15 ENCOUNTER — Other Ambulatory Visit: Payer: Self-pay | Admitting: Internal Medicine

## 2021-03-17 NOTE — Progress Notes (Signed)
Subjective:    Patient ID: Erica [name], female    DOB: 1981/04/02, 40 y.o.   MRN: [id number]  HPI The patient is here for follow up of their chronic medical problems, including depression, anxiety, back spasms  Stung by wasp 10 days ago on the right foot  The swelling has improved, so has the pain, but it is still a little swollen.    She has some depression at times, but it is not constant.    Left eye goes blurry at the end of the day.  If she puts lots of eye drops in it it helps.   Medications and allergies reviewed with patient and updated if appropriate.  Patient Active Problem List   Diagnosis Date Noted   Nonallopathic lesion of cervical region 04/07/2020   Nonallopathic lesion of rib cage 04/07/2020   Nonallopathic lesion of thoracic region 04/07/2020   Nonallopathic lesion of lumbosacral region 04/07/2020   Nonallopathic lesion of sacral region 04/07/2020   Left hip pain 04/07/2020   Cervical radiculopathy at C8 03/31/2020   Back spasm 12/23/2015   Anxiety state 11/02/2010   Depression 11/02/2010   Allergic rhinitis 11/02/2010    Current Outpatient Medications on File Prior to Visit  Medication Sig Dispense Refill   ALPRAZolam (XANAX) 0.5 MG tablet TAKE 1 TABLET(0.5 MG) BY MOUTH THREE TIMES DAILY AS NEEDED FOR ANXIETY. FOLLOW UP AS NEEDED FOR ADDITIONAL REFILLS 90 tablet 1   cyclobenzaprine (FLEXERIL) 10 MG tablet Take 1 tablet (10 mg total) by mouth 3 (three) times daily as needed for muscle spasms. 30 tablet 3   DULoxetine (CYMBALTA) 60 MG capsule Take 1 capsule by mouth once daily 30 capsule 0   gabapentin (NEURONTIN) 100 MG capsule Take 2 capsules (200 mg total) by mouth at bedtime. 180 capsule 0   meloxicam (MOBIC) 15 MG tablet TAKE 1 TABLET(15 MG) BY MOUTH DAILY 30 tablet 0   levonorgestrel (MIRENA, 52 MG,) 20 MCG/24HR IUD 1 Intra Uterine Device (1 each total) by Intrauterine route once. 1 each 0   No current facility-administered medications on file  prior to visit.    Past Medical History:  Diagnosis Date   Allergy    Anxiety    Depression     Past Surgical History:  Procedure Laterality Date   NO PAST SURGERIES      Social History   Socioeconomic History   Marital status: Divorced    Spouse name: Not on file   Number of children: Not on file   Years of education: Not on file   Highest education level: Not on file  Occupational History   Not on file  Tobacco Use   Smoking status: Former    Packs/day: 0.50    Pack years: 0.00    Types: Cigarettes    Quit date: 10/27/2019    Years since quitting: 1.3   Smokeless tobacco: Never  Vaping Use   Vaping Use: Never used  Substance and Sexual Activity   Alcohol use: Yes    Comment: 10 drinks week   Drug use: Yes    Frequency: 1.0 times per week    Types: Marijuana   Sexual activity: Yes    Partners: Male    Birth control/protection: I.U.D.    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    Part time work at day care   Originally  from [place], [place], s/p [employer] for [employer]   Social Determinants of Health   Financial Resource Strain: Not on file  Food Insecurity: Not on file  Transportation Needs: Not on file  Physical Activity: Not on file  Stress: Not on file  Social Connections: Not on file    Family History  Problem Relation Age of Onset   Alcohol abuse Mother    Alcohol abuse Other        Whole family   Breast cancer Other    Lung cancer Other     Review of Systems  Constitutional:  Negative for chills and fever.  Respiratory:  Negative for cough, shortness of breath and wheezing.   Cardiovascular:  Negative for chest pain, palpitations and leg swelling.  Neurological:  Negative for light-headedness and headaches.      Objective:   Vitals:   03/18/21 0936  BP: 128/88  Pulse: 61  Temp: 97.7 F (36.5 C)  SpO2: 98%   BP  Readings from Last 3 Encounters:  03/18/21 128/88  02/02/21 110/78  12/22/20 122/88   Wt Readings from Last 3 Encounters:  03/18/21 197 lb (89.4 kg)  02/02/21 197 lb (89.4 kg)  12/22/20 199 lb (90.3 kg)   Body mass index is 30.85 kg/m.   Physical Exam    Constitutional: Appears well-developed and well-nourished. No distress.  HENT:  Head: Normocephalic and atraumatic.  Neck: Neck supple. No tracheal deviation present. No thyromegaly present.  No cervical lymphadenopathy Cardiovascular: Normal rate, regular rhythm and normal heart sounds.   No murmur heard. No carotid bruit .  No edema Pulmonary/Chest: Effort normal and breath sounds normal. No respiratory distress. No has no wheezes. No rales.  Skin: Skin is warm and dry. Not diaphoretic.  Psychiatric: Normal mood and affect. Behavior is normal.      Assessment & Plan:    See Problem List for Assessment and Plan of chronic medical problems.    This visit occurred during the SARS-CoV-2 public health emergency.  Safety protocols were in place, including screening questions prior to the visit, additional usage of staff PPE, and extensive cleaning of exam room while observing appropriate contact time as indicated for disinfecting solutions.

## 2021-03-18 ENCOUNTER — Encounter: Payer: Self-pay | Admitting: Internal Medicine

## 2021-03-18 ENCOUNTER — Ambulatory Visit (INDEPENDENT_AMBULATORY_CARE_PROVIDER_SITE_OTHER): Payer: Medicaid Other | Admitting: Internal Medicine

## 2021-03-18 ENCOUNTER — Other Ambulatory Visit: Payer: Self-pay

## 2021-03-18 VITALS — BP 128/88 | HR 61 | Temp 97.7°F | Ht 67.0 in | Wt 197.0 lb

## 2021-03-18 DIAGNOSIS — M62830 Muscle spasm of back: Secondary | ICD-10-CM

## 2021-03-18 DIAGNOSIS — F411 Generalized anxiety disorder: Secondary | ICD-10-CM

## 2021-03-18 DIAGNOSIS — F3289 Other specified depressive episodes: Secondary | ICD-10-CM

## 2021-03-18 NOTE — Assessment & Plan Note (Signed)
Chronic Fairly controlled Continue Cymbalta 60 mg daily and Xanax 0.5 mg 3 times daily as needed

## 2021-03-18 NOTE — Assessment & Plan Note (Signed)
Chronic Intermittent Continue Flexeril 10 mg daily as needed

## 2021-03-18 NOTE — Patient Instructions (Signed)
     Medications changes include :  none      Please followup in 1 year  

## 2021-03-18 NOTE — Assessment & Plan Note (Signed)
Chronic Depression fairly controlled, but she does state intermittent depression at times She is not sure if she really wants to increase the medication or not or if it will help Continue Cymbalta 60 mg daily

## 2021-04-28 ENCOUNTER — Other Ambulatory Visit: Payer: Self-pay | Admitting: Family Medicine

## 2021-04-29 ENCOUNTER — Other Ambulatory Visit: Payer: Self-pay | Admitting: Internal Medicine

## 2021-05-04 ENCOUNTER — Encounter: Payer: Self-pay | Admitting: Internal Medicine

## 2021-05-04 ENCOUNTER — Other Ambulatory Visit: Payer: Self-pay

## 2021-05-04 ENCOUNTER — Ambulatory Visit (INDEPENDENT_AMBULATORY_CARE_PROVIDER_SITE_OTHER): Payer: Self-pay | Admitting: Family Medicine

## 2021-05-04 ENCOUNTER — Encounter: Payer: Self-pay | Admitting: Family Medicine

## 2021-05-04 VITALS — BP 120/78 | HR 90 | Ht 67.0 in | Wt 195.0 lb

## 2021-05-04 DIAGNOSIS — M9901 Segmental and somatic dysfunction of cervical region: Secondary | ICD-10-CM

## 2021-05-04 DIAGNOSIS — M9902 Segmental and somatic dysfunction of thoracic region: Secondary | ICD-10-CM

## 2021-05-04 DIAGNOSIS — M9903 Segmental and somatic dysfunction of lumbar region: Secondary | ICD-10-CM

## 2021-05-04 DIAGNOSIS — M9904 Segmental and somatic dysfunction of sacral region: Secondary | ICD-10-CM

## 2021-05-04 DIAGNOSIS — M5412 Radiculopathy, cervical region: Secondary | ICD-10-CM

## 2021-05-04 DIAGNOSIS — M25552 Pain in left hip: Secondary | ICD-10-CM

## 2021-05-04 DIAGNOSIS — M9908 Segmental and somatic dysfunction of rib cage: Secondary | ICD-10-CM

## 2021-05-04 NOTE — Patient Instructions (Addendum)
Good to see you Erica [name] in the black cloak  Keep doing everything you are doing See me again in 3 months

## 2021-05-04 NOTE — Assessment & Plan Note (Signed)
Patient's neck exam no longer having any type of radicular symptoms.  Doing very well with the Cymbalta and the gabapentin.  Patient seems to be very well-adjusted at the moment with everything including her pain.  Follow-up with me again in 6 weeks

## 2021-05-04 NOTE — Assessment & Plan Note (Addendum)
Still has some of the labral pathology and still is uncomfortable but not stopping her from activity at the moment.  Discussed icing regimen and home exercises.  Increase activity slowly.  Follow-up again in 12 weeks

## 2021-05-04 NOTE — Progress Notes (Signed)
  [name] [address] [place] [address] Phone: [phone] Subjective:   Wynema Birch, am serving as a scribe for Dr. Antoine Primas.  I'm seeing this patient by the request  of:  [name], MD  CC: Back and neck pain follow-up  [id number]  Karnisha Lefebre Treece is a 40 y.o. female coming in with complaint of back and neck pain. OMT 02/02/2021. Taking Meloxicam. Patient states that she is doing well over all was having some LBP and tightness.  Patient denies any numbness or tingling.  Patient describes the pain as a dull, throbbing aching sensation.  Nothing of that is stopping her from activity.  Continue to stay active otherwise.        On cymbalta   Past Medical History:  Diagnosis Date   Allergy    Anxiety    Depression     No Known Allergies   Review of Systems:  No headache, visual changes, nausea, vomiting, diarrhea, constipation, dizziness, abdominal pain, skin rash, fevers, chills, night sweats, weight loss, swollen lymph nodes, body aches, joint swelling, chest pain, shortness of breath, mood changes. POSITIVE muscle aches  Objective  Blood pressure 120/78, pulse 90, height 5\' 7"  (1.702 m), weight 195 lb (88.5 kg), SpO2 98 %.   General: No apparent distress alert and oriented x3 mood and affect normal, dressed appropriately.  HEENT: Pupils equal, extraocular movements intact  Respiratory: Patient's speak in full sentences and does not appear short of breath  Cardiovascular: No lower extremity edema, non tender, no erythema  Left hip pain still has some tenderness to palpation.  Patient still has some decrease in internal range of motion. Upper back does have some mild tenderness noted as well.  Neck exam does have some limited extension to the neck negative for Spurling's though.  Osteopathic findings  C2 flexed rotated and side bent right C7 flexed rotated and side bent left T5 extended rotated and side bent  right inhaled rib L5 flexed rotated and side bent left Sacrum right on right       Assessment and Plan:  Cervical radiculopathy at C8 Patient's neck exam no longer having any type of radicular symptoms.  Doing very well with the Cymbalta and the gabapentin.  Patient seems to be very well-adjusted at the moment with everything including her pain.  Follow-up with me again in 6 weeks   Nonallopathic problems  Decision today to treat with OMT was based on Physical Exam  After verbal consent patient was treated with HVLA, ME, FPR techniques in cervical, rib, thoracic, lumbar, and sacral  areas  Patient tolerated the procedure well with improvement in symptoms  Patient given exercises, stretches and lifestyle modifications  See medications in patient instructions if given  Patient will follow up in 4-8 weeks      The above documentation has been reviewed and is accurate and complete , DO       Note: This dictation was prepared with Dragon dictation along with smaller phrase technology. Any transcriptional errors that result from this process are unintentional.

## 2021-05-17 ENCOUNTER — Other Ambulatory Visit: Payer: Self-pay

## 2021-05-17 ENCOUNTER — Ambulatory Visit (INDEPENDENT_AMBULATORY_CARE_PROVIDER_SITE_OTHER): Payer: Self-pay | Admitting: Internal Medicine

## 2021-05-17 ENCOUNTER — Encounter: Payer: Self-pay | Admitting: Internal Medicine

## 2021-05-17 DIAGNOSIS — M5412 Radiculopathy, cervical region: Secondary | ICD-10-CM

## 2021-05-17 DIAGNOSIS — M999 Biomechanical lesion, unspecified: Secondary | ICD-10-CM

## 2021-05-17 MED ORDER — CYCLOBENZAPRINE HCL 10 MG PO TABS: 10.0000 mg | ORAL_TABLET | Freq: Three times a day (TID) | ORAL | 3 refills | Status: DC | PRN

## 2021-05-17 MED ORDER — MELOXICAM 15 MG PO TABS: 15.0000 mg | ORAL_TABLET | Freq: Every day | ORAL | 0 refills | Status: DC

## 2021-05-17 NOTE — Assessment & Plan Note (Signed)
Rx meloxicam and flexeril to use for the next several weeks. If no improvement can consider lumbar x-ray.
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
## 2021-05-17 NOTE — Patient Instructions (Addendum)
It is okay to take the flexeril at night for pain.  Take the meloxicam daily for 2 weeks to calm down the inflammation.  Let us know if not improving in 1-2 weeks.
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
## 2021-06-04 ENCOUNTER — Encounter: Payer: Self-pay | Admitting: Family Medicine

## 2021-06-04 ENCOUNTER — Other Ambulatory Visit: Payer: Self-pay

## 2021-06-04 ENCOUNTER — Ambulatory Visit (INDEPENDENT_AMBULATORY_CARE_PROVIDER_SITE_OTHER): Payer: Self-pay | Admitting: Family Medicine

## 2021-06-04 VITALS — Ht 67.0 in

## 2021-06-04 DIAGNOSIS — M9903 Segmental and somatic dysfunction of lumbar region: Secondary | ICD-10-CM

## 2021-06-04 DIAGNOSIS — M9908 Segmental and somatic dysfunction of rib cage: Secondary | ICD-10-CM

## 2021-06-04 DIAGNOSIS — M9904 Segmental and somatic dysfunction of sacral region: Secondary | ICD-10-CM

## 2021-06-04 DIAGNOSIS — M9901 Segmental and somatic dysfunction of cervical region: Secondary | ICD-10-CM

## 2021-06-04 DIAGNOSIS — M62830 Muscle spasm of back: Secondary | ICD-10-CM

## 2021-06-04 DIAGNOSIS — M9902 Segmental and somatic dysfunction of thoracic region: Secondary | ICD-10-CM
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
## 2021-06-04 NOTE — Patient Instructions (Signed)
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 Do prescribed exercises at least 3x a week Voltaren Gel See you again in 3 to 5 weeks
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
## 2021-06-13 ENCOUNTER — Other Ambulatory Visit: Payer: Self-pay | Admitting: Internal Medicine

## 2021-06-30 NOTE — Progress Notes (Signed)
[name] [address] [place] [address] Phone: [phone] Subjective:   INadine Counts, am serving as a scribe for Dr. Antoine Primas. This visit occurred during the SARS-CoV-2 public health emergency.  Safety protocols were in place, including screening questions prior to the visit, additional usage of staff PPE, and extensive cleaning of exam room while observing appropriate contact time as indicated for disinfecting solutions.   I'm seeing this patient by the request  of:  [name], MD  CC: Back pain and hip pain follow-up  ION:[id number]  Erica [name] is a 40 y.o. female coming in with complaint of back and neck pain. OMT on 06/04/2021. Patient states knee and back ar getting better, but not 100 percent.  Medications patient has been prescribed: None           Reviewed prior external information including notes and imaging from previsou exam, outside providers and external EMR if available.   As well as notes that were available from care everywhere and other healthcare systems.  Past medical history, social, surgical and family history all reviewed in electronic medical record.  No pertanent information unless stated regarding to the chief complaint.   Past Medical History:  Diagnosis Date   Allergy    Anxiety    Depression     No Known Allergies   Review of Systems:  No headache, visual changes, nausea, vomiting, diarrhea, constipation, dizziness, abdominal pain, skin rash, fevers, chills, night sweats, weight loss, swollen lymph nodes, body aches, joint swelling, chest pain, shortness of breath, mood changes. POSITIVE muscle aches  Objective  Blood pressure 132/82, pulse (!) 58, height 5\' 7"  (1.702 m), weight 203 lb (92.1 kg), SpO2 98 %.   General: No apparent distress alert and oriented x3 mood and affect normal, dressed appropriately.  HEENT: Pupils equal, extraocular movements intact  Respiratory: Patient's  speak in full sentences and does not appear short of breath  Cardiovascular: No lower extremity edema, non tender, no erythema  Patient does have back pain and is continuing in the paraspinal musculature.  Patient does have tightness of the neck as well bilaterally.  Patient has negative Spurling's.  Still tightness of the left hip noted with some decrease in internal range of motion.  Osteopathic findings  C2 flexed rotated and side bent right C6 flexed rotated and side bent left T3 extended rotated and side bent right inhaled rib T9 extended rotated and side bent left L2 flexed rotated and side bent right Sacrum right on right       Assessment and Plan:  Left hip pain Continue hip and back pain.  Discussed with patient at great length about icing regimen and home exercise.  Discussed which activities to do which wants to avoid.  Increase activity slowly.  Follow-up with me again in 6 to 8 weeks.   Nonallopathic problems  Decision today to treat with OMT was based on Physical Exam  After verbal consent patient was treated with HVLA, ME, FPR techniques in cervical, rib, thoracic, lumbar, and sacral  areas  Patient tolerated the procedure well with improvement in symptoms  Patient given exercises, stretches and lifestyle modifications  See medications in patient instructions if given  Patient will follow up in 4-8 weeks      The above documentation has been reviewed and is accurate and complete , DO       Note: This dictation was prepared with Dragon dictation along with smaller  Company secretary. Any transcriptional errors that result from this process are unintentional.

## 2021-07-01 ENCOUNTER — Ambulatory Visit (INDEPENDENT_AMBULATORY_CARE_PROVIDER_SITE_OTHER): Payer: Self-pay | Admitting: Family Medicine

## 2021-07-01 ENCOUNTER — Other Ambulatory Visit: Payer: Self-pay

## 2021-07-01 VITALS — BP 132/82 | HR 58 | Ht 67.0 in | Wt 203.0 lb

## 2021-07-01 DIAGNOSIS — M9904 Segmental and somatic dysfunction of sacral region: Secondary | ICD-10-CM

## 2021-07-01 DIAGNOSIS — M9902 Segmental and somatic dysfunction of thoracic region: Secondary | ICD-10-CM

## 2021-07-01 DIAGNOSIS — M25552 Pain in left hip: Secondary | ICD-10-CM

## 2021-07-01 DIAGNOSIS — M9903 Segmental and somatic dysfunction of lumbar region: Secondary | ICD-10-CM

## 2021-07-01 DIAGNOSIS — M9908 Segmental and somatic dysfunction of rib cage: Secondary | ICD-10-CM

## 2021-07-01 DIAGNOSIS — M9901 Segmental and somatic dysfunction of cervical region: Secondary | ICD-10-CM

## 2021-07-01 MED ORDER — GABAPENTIN 100 MG PO CAPS: 200.0000 mg | ORAL_CAPSULE | Freq: Every day | ORAL | 0 refills | Status: DC

## 2021-07-01 NOTE — Patient Instructions (Signed)
Ice knee at night Try the tens Stay active I think you're doing well See you again in 6-8 weeks

## 2021-07-02 ENCOUNTER — Encounter: Payer: Self-pay | Admitting: Family Medicine

## 2021-07-02 NOTE — Assessment & Plan Note (Signed)
Continue hip and back pain.  Discussed with patient at great length about icing regimen and home exercise.  Discussed which activities to do which wants to avoid.  Increase activity slowly.  Follow-up with me again in 6 to 8 weeks.

## 2021-07-29 ENCOUNTER — Other Ambulatory Visit: Payer: Self-pay | Admitting: Internal Medicine

## 2021-08-02 NOTE — Telephone Encounter (Signed)
Patient requesting form emailed to [email or web address]  Patient states she has not received the form and is requesting a call back
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
## 2021-08-25 ENCOUNTER — Other Ambulatory Visit: Payer: Self-pay

## 2021-08-25 ENCOUNTER — Encounter: Payer: Self-pay | Admitting: Obstetrics & Gynecology

## 2021-08-25 ENCOUNTER — Other Ambulatory Visit (HOSPITAL_COMMUNITY)
Admission: RE | Admit: 2021-08-25 | Discharge: 2021-08-25 | Disposition: A | Discharge status: Home / Self Care | Payer: Medicaid Other | Source: Ambulatory Visit | Attending: Obstetrics & Gynecology | Admitting: Obstetrics & Gynecology

## 2021-08-25 ENCOUNTER — Ambulatory Visit (INDEPENDENT_AMBULATORY_CARE_PROVIDER_SITE_OTHER): Payer: Self-pay | Admitting: Family Medicine

## 2021-08-25 ENCOUNTER — Ambulatory Visit (INDEPENDENT_AMBULATORY_CARE_PROVIDER_SITE_OTHER): Payer: Medicaid Other | Admitting: Obstetrics & Gynecology

## 2021-08-25 ENCOUNTER — Encounter: Payer: Self-pay | Admitting: Family Medicine

## 2021-08-25 VITALS — BP 118/76 | HR 58 | Ht 67.0 in | Wt 204.0 lb

## 2021-08-25 VITALS — BP 112/80 | HR 65 | Resp 16 | Ht 67.5 in | Wt 202.0 lb

## 2021-08-25 DIAGNOSIS — Z01419 Encounter for gynecological examination (general) (routine) without abnormal findings: Secondary | ICD-10-CM | POA: Insufficient documentation

## 2021-08-25 DIAGNOSIS — M25552 Pain in left hip: Secondary | ICD-10-CM

## 2021-08-25 DIAGNOSIS — E6609 Other obesity due to excess calories: Secondary | ICD-10-CM

## 2021-08-25 DIAGNOSIS — M9901 Segmental and somatic dysfunction of cervical region: Secondary | ICD-10-CM

## 2021-08-25 DIAGNOSIS — Z30431 Encounter for routine checking of intrauterine contraceptive device: Secondary | ICD-10-CM

## 2021-08-25 DIAGNOSIS — M9902 Segmental and somatic dysfunction of thoracic region: Secondary | ICD-10-CM

## 2021-08-25 DIAGNOSIS — M9908 Segmental and somatic dysfunction of rib cage: Secondary | ICD-10-CM

## 2021-08-25 DIAGNOSIS — Z6831 Body mass index (BMI) 31.0-31.9, adult: Secondary | ICD-10-CM

## 2021-08-25 DIAGNOSIS — M9904 Segmental and somatic dysfunction of sacral region: Secondary | ICD-10-CM

## 2021-08-25 DIAGNOSIS — M9903 Segmental and somatic dysfunction of lumbar region: Secondary | ICD-10-CM

## 2021-08-25 NOTE — Patient Instructions (Signed)
Glad you are doing well See me in 2 months Iron 65mg  w 500mg  Vit C around menstruation

## 2021-08-25 NOTE — Progress Notes (Signed)
Erica [name] 04/16/1981 [id number]   History:    40 y.o. G1P1L1 Divorced.  Boyfriend x 2 years.  Daughter is turning 25 yo this weekend.   RP:  Established patient presenting for annual gyn exam    HPI: Well on Liletta IUD since December 2017.  No breakthrough bleeding.  No pelvic pain.  No pain with intercourse.  PAP: 05-01-19 Neg.  Not always using condoms.  Breasts normal.  MMG: 2019 Neg.  Needs to schedule Mammo.  Urine and bowel movements normal.  Body mass index 31.17.  Good fitness and healthy nutrition.  Stopped smoking x 09/2019.  Health labs with family physician.   Past medical history,surgical history, family history and social history were all reviewed and documented in the EPIC chart.  Gynecologic History No LMP recorded. (Menstrual status: IUD).  Obstetric History OB History  Gravida Para Term Preterm AB Living  1 1 1  0 0 1  SAB IAB Ectopic Multiple Live Births  0 0 0 0 1    # Outcome Date GA Lbr Len/2nd Weight Sex Delivery Anes PTL Lv  1 Term 08/29/12 [redacted]w[redacted]d 06:22 / 00:14 7 lb 1.8 oz (3.226 kg) F Vag-Spont Local  LIV     ROS: A ROS was performed and pertinent positives and negatives are included in the history.  GENERAL: No fevers or chills. HEENT: No change in vision, no earache, sore throat or sinus congestion. NECK: No pain or stiffness. CARDIOVASCULAR: No chest pain or pressure. No palpitations. PULMONARY: No shortness of breath, cough or wheeze. GASTROINTESTINAL: No abdominal pain, nausea, vomiting or diarrhea, melena or bright red blood per rectum. GENITOURINARY: No urinary frequency, urgency, hesitancy or dysuria. MUSCULOSKELETAL: No joint or muscle pain, no back pain, no recent trauma. DERMATOLOGIC: No rash, no itching, no lesions. ENDOCRINE: No polyuria, polydipsia, no heat or cold intolerance. No recent change in weight. HEMATOLOGICAL: No anemia or easy bruising or bleeding. NEUROLOGIC: No headache, seizures, numbness, tingling or weakness. PSYCHIATRIC: No  depression, no loss of interest in normal activity or change in sleep pattern.     Exam:   BP 112/80   Pulse 65   Resp 16   Ht 5' 7.5" (1.715 m)   Wt 202 lb (91.6 kg)   BMI 31.17 kg/m   Body mass index is 31.17 kg/m.  General appearance : Well developed well nourished female. No acute distress HEENT: Eyes: no retinal hemorrhage or exudates,  Neck supple, trachea midline, no carotid bruits, no thyroidmegaly Lungs: Clear to auscultation, no rhonchi or wheezes, or rib retractions  Heart: Regular rate and rhythm, no murmurs or gallops Breast:Examined in sitting and supine position were symmetrical in appearance, no palpable masses or tenderness,  no skin retraction, no nipple inversion, no nipple discharge, no skin discoloration, no axillary or supraclavicular lymphadenopathy Abdomen: no palpable masses or tenderness, no rebound or guarding Extremities: no edema or skin discoloration or tenderness  Pelvic: Vulva: Normal             Vagina: No gross lesions or discharge  Cervix: No gross lesions or discharge. IUD strings visible at the EO.  Pap reflex done.  Uterus  AV, normal size, shape and consistency, non-tender and mobile  Adnexa  Without masses or tenderness  Anus: Normal   Assessment/Plan:  40 y.o. female for annual exam   1. Encounter for routine gynecological examination with Papanicolaou smear of cervix Well on Liletta IUD since December 2017.  No breakthrough bleeding.  No pelvic pain.  No pain with intercourse.  PAP: 05-01-19 Neg. Pap reflex done today. Not always using condoms.  Breasts normal.  MMG: 2019 Neg.  Needs to schedule Mammo.  Urine and bowel movements normal.  Body mass index 31.17.  Good fitness and healthy nutrition.  Stopped smoking x 09/2019.  Health labs with family physician. - Cytology - PAP( [place])  2. Encounter for routine checking of intrauterine contraceptive device (IUD) F/U removal/insertion of IUD. - IUD removal; Future - IUD Insertion;  Future  3. Class 1 obesity due to excess calories without serious comorbidity with body mass index (BMI) of 31.0 to 31.9 in adult  Lower calorie/carb diet.  Continue with fitness.  Genia Del MD, 2:57 PM 08/25/2021
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
## 2021-08-26 ENCOUNTER — Other Ambulatory Visit: Payer: Self-pay | Admitting: Internal Medicine

## 2021-08-26 DIAGNOSIS — Z1231 Encounter for screening mammogram for malignant neoplasm of breast: Secondary | ICD-10-CM

## 2021-08-27 ENCOUNTER — Telehealth: Payer: Self-pay | Admitting: *Deleted

## 2021-08-27 LAB — CYTOLOGY - PAP: Diagnosis: NEGATIVE

## 2021-08-27 NOTE — Telephone Encounter (Signed)
Spoke with patient, advised as seen below. Patient is agreeable to proceed with Mirena IUD insertion and removal of Liletta.   Appt notes updated.   Routing to provider for final review. Patient is agreeable to disposition. Will close encounter.  Cc: Hayley Carder

## 2021-08-27 NOTE — Telephone Encounter (Signed)
Left message to call Noreene Larsson, RN at [facility], [phone], regarding upcoming appt.   Patient is scheduled for Liletta IUD insertion on  08/31/21. Our office no longer carries Liletta. Reviewed with provider, recommends Mirena IUD, comparable to [place]. Mirena is good for 8 years. Called to review with patient.

## 2021-08-30 ENCOUNTER — Other Ambulatory Visit: Payer: Self-pay

## 2021-08-30 MED ORDER — FLUCONAZOLE 150 MG PO TABS: 150.0000 mg | ORAL_TABLET | Freq: Once | ORAL | 0 refills | Status: AC

## 2021-08-31 ENCOUNTER — Other Ambulatory Visit: Payer: Self-pay

## 2021-08-31 ENCOUNTER — Ambulatory Visit (INDEPENDENT_AMBULATORY_CARE_PROVIDER_SITE_OTHER): Payer: Medicaid Other | Admitting: Obstetrics & Gynecology

## 2021-08-31 ENCOUNTER — Encounter: Payer: Self-pay | Admitting: Obstetrics & Gynecology

## 2021-08-31 VITALS — BP 130/90

## 2021-08-31 DIAGNOSIS — Z30431 Encounter for routine checking of intrauterine contraceptive device: Secondary | ICD-10-CM

## 2021-08-31 DIAGNOSIS — Z30433 Encounter for removal and reinsertion of intrauterine contraceptive device: Secondary | ICD-10-CM

## 2021-08-31 NOTE — Progress Notes (Signed)
    Erica [name] 12-15-1980 [id number]        40 y.o.  G1P1001 Divorced.  Boyfriend x 2 years.  Daughter is 86 yo.  RP: Liletta IUD removal/Mirena IUD insertion  HPI: Well on Liletta IUD x 08/2016.  No BTB,  no pelvic pain.  Wants to continue on the Progesterone IUD.     OB History  Gravida Para Term Preterm AB Living  1 1 1  0 0 1  SAB IAB Ectopic Multiple Live Births  0 0 0 0 1    # Outcome Date GA Lbr Len/2nd Weight Sex Delivery Anes PTL Lv  1 Term 08/29/12 [redacted]w[redacted]d 06:22 / 00:14 7 lb 1.8 oz (3.226 kg) F Vag-Spont Local  LIV    Past medical history,surgical history, problem list, medications, allergies, family history and social history were all reviewed and documented in the EPIC chart.   Directed ROS with pertinent positives and negatives documented in the history of present illness/assessment and plan.  Exam:  Vitals:   08/31/21 1354  BP: 130/90   General appearance:  Normal  Written informed consent obtained                                                                    IUD procedure note       Patient presented to the office today for removal of Liletta IUD and placement of Mirena IUD. The patient had previously been provided with literature information on this method of contraception. The risks benefits and pros and cons were discussed and all her questions were answered. She is fully aware that this form of contraception is 99% effective and is good for 7 years.  Pelvic exam: Vulva normal Vagina: No lesions or discharge Cervix: No lesions or discharge.  IUD strings visible.  Strings grasped with a fenestrated clamp.  IUD removed easily, complete and intact. Uterus: AV/Intermediate position Adnexa: No masses or tenderness Rectal exam: Not done  The cervix was cleansed with Betadine solution. Hurricane spray on the cervix.  Os finder hysterometry at 7.5 cm.  Intermediate position.  The IUD was shown to the patient and inserted in a sterile fashion.  The IUD  string was trimmed at 3 cm. Patient was instructed to return back to the office in one month for follow up.         Assessment/Plan:  40 y.o. G1P1001   1. Encounter for IUD removal and reinsertion Time to change IUD.  Informed consent signed. Easy removal of Liletta IUD.  Easy insertion of Mirena IUD.  No Cx.  Well tolerated.  Post procedure precautions.  F/U 4 weeks for IUD check.  Counseling on eventual contraception when gets in menopause.  2. Encounter for routine checking of intrauterine contraceptive device (IUD) - IUD removal - IUD Insertion   45 MD, 2:19 PM 08/31/2021

## 2021-09-10 ENCOUNTER — Other Ambulatory Visit: Payer: Self-pay | Admitting: Internal Medicine

## 2021-09-29 ENCOUNTER — Ambulatory Visit: Payer: Medicaid Other | Admitting: Obstetrics & Gynecology

## 2021-10-05 ENCOUNTER — Ambulatory Visit (INDEPENDENT_AMBULATORY_CARE_PROVIDER_SITE_OTHER): Payer: 59 | Admitting: Obstetrics & Gynecology

## 2021-10-05 ENCOUNTER — Other Ambulatory Visit: Payer: Self-pay

## 2021-10-05 ENCOUNTER — Encounter: Payer: Self-pay | Admitting: Obstetrics & Gynecology

## 2021-10-05 VITALS — BP 128/86 | HR 61

## 2021-10-05 DIAGNOSIS — Z30431 Encounter for routine checking of intrauterine contraceptive device: Secondary | ICD-10-CM

## 2021-10-05 NOTE — Progress Notes (Signed)
° ° °  Erica [name] 05/26/1981 [id number]        41 y.o.  G1P1L1   RP: Mirena IUD check 4 wks post insertion  HPI: Well since Mirena IUD insertion last month.  Did have COVID at Christmas.  LMP on the heavy side and feels mildly bloated.  No vaginal discharge or bleeding currently.  No fever.   OB History  Gravida Para Term Preterm AB Living  1 1 1  0 0 1  SAB IAB Ectopic Multiple Live Births  0 0 0 0 1    # Outcome Date GA Lbr Len/2nd Weight Sex Delivery Anes PTL Lv  1 Term 08/29/12 [redacted]w[redacted]d 06:22 / 00:14 7 lb 1.8 oz (3.226 kg) F Vag-Spont Local  LIV    Past medical history,surgical history, problem list, medications, allergies, family history and social history were all reviewed and documented in the EPIC chart.   Directed ROS with pertinent positives and negatives documented in the history of present illness/assessment and plan.  Exam:  Vitals:   10/05/21 0943  BP: 128/86  Pulse: 61  SpO2: 97%   General appearance:  Normal  Abdomen: Normal  Gynecologic exam: Vulva normal.  Speculum:  Cervix normal, no erythema.  IUD strings visible at the EO.  Normal vaginal secretions, no blood.   Assessment/Plan:  41 y.o. G1P1001   1. Encounter for routine checking of intrauterine contraceptive device (IUD)  Well since Mirena IUD insertion last month.  Did have COVID at Christmas.  LMP on the heavy side and feels mildly bloated.  No vaginal discharge or bleeding currently.  No fever.  Mirena IUD in good position, no sign of infection.  Reassured.   04-18-1974 MD, 10:28 AM 10/05/2021

## 2021-10-06 ENCOUNTER — Ambulatory Visit
Admission: RE | Admit: 2021-10-06 | Discharge: 2021-10-06 | Disposition: A | Discharge status: Home / Self Care | Payer: Medicaid Other | Source: Ambulatory Visit | Attending: Internal Medicine | Admitting: Internal Medicine

## 2021-10-06 DIAGNOSIS — Z1231 Encounter for screening mammogram for malignant neoplasm of breast: Secondary | ICD-10-CM

## 2021-10-11 ENCOUNTER — Other Ambulatory Visit: Payer: Self-pay | Admitting: Internal Medicine

## 2021-10-19 NOTE — Progress Notes (Signed)
[name] [address] [place] [address] Phone: [phone] Subjective:   INadine Counts, am serving as a scribe for Dr. Antoine Primas. This visit occurred during the SARS-CoV-2 public health emergency.  Safety protocols were in place, including screening questions prior to the visit, additional usage of staff PPE, and extensive cleaning of exam room while observing appropriate contact time as indicated for disinfecting solutions.   I'm seeing this patient by the request  of:  [name], MD  CC: Back and neck pain follow-up  [id number]  Persephanie Laatsch Genova is a 41 y.o. female coming in with complaint of back and neck pain. OMT 08/25/2021. Patient states same as usual, hip is getting much better. Doing acupuncture. No new complaints.  Medications patient has been prescribed: None  Taking:         Reviewed prior external information including notes and imaging from previsou exam, outside providers and external EMR if available.   As well as notes that were available from care everywhere and other healthcare systems.  Past medical history, social, surgical and family history all reviewed in electronic medical record.  No pertanent information unless stated regarding to the chief complaint.   Past Medical History:  Diagnosis Date   Allergy    Anxiety    Depression     No Known Allergies   Review of Systems:  No headache, visual changes, nausea, vomiting, diarrhea, constipation, dizziness, abdominal pain, skin rash, fevers, chills, night sweats, weight loss, swollen lymph nodes, body aches, joint swelling, chest pain, shortness of breath, mood changes. POSITIVE muscle aches  Objective  Blood pressure 114/78, pulse 60, height 5\' 7"  (1.702 m), weight 203 lb (92.1 kg), SpO2 97 %.   General: No apparent distress alert and oriented x3 mood and affect normal, dressed appropriately.  HEENT: Pupils equal, extraocular movements intact   Respiratory: Patient's speak in full sentences and does not appear short of breath  Cardiovascular: No lower extremity edema, non tender, no erythema  Left hip exam still has some mild tenderness to palpation over the gluteal area.  Still some limited internal range of motion.  Negative straight leg test noted today.  Patient does have very mild tightness of the right hip flexor.  Osteopathic findings  C4 flexed rotated and side bent left T3 extended rotated and side bent left inhaled rib T9 extended rotated and side bent left L2 flexed rotated and side bent right L5 flexed rotated and side bent left Sacrum left on left       Assessment and Plan:  Left hip pain Doing relatively better overall.  Patient has felt some improvement with the range of motion, meloxicam and the Cymbalta.  Pain is a chronic problem with exacerbation that also responds fairly well to osteopathic manipulation.  Discussed icing regimen and home exercises.  Which activities to do which wants to avoid.  Follow-up again in 6 to 8 weeks otherwise.   Nonallopathic problems  Decision today to treat with OMT was based on Physical Exam  After verbal consent patient was treated with HVLA, ME, FPR techniques in cervical, rib, thoracic, lumbar, and sacral  areas  Patient tolerated the procedure well with improvement in symptoms  Patient given exercises, stretches and lifestyle modifications  See medications in patient instructions if given  Patient will follow up in 4-8 weeks      The above documentation has been reviewed and is accurate and complete , DO  Note: This dictation was prepared with Dragon dictation along with smaller phrase technology. Any transcriptional errors that result from this process are unintentional.

## 2021-10-20 ENCOUNTER — Ambulatory Visit (INDEPENDENT_AMBULATORY_CARE_PROVIDER_SITE_OTHER): Payer: 59 | Admitting: Family Medicine

## 2021-10-20 ENCOUNTER — Other Ambulatory Visit: Payer: Self-pay

## 2021-10-20 VITALS — BP 114/78 | HR 60 | Ht 67.0 in | Wt 203.0 lb

## 2021-10-20 DIAGNOSIS — M9908 Segmental and somatic dysfunction of rib cage: Secondary | ICD-10-CM

## 2021-10-20 DIAGNOSIS — M9901 Segmental and somatic dysfunction of cervical region: Secondary | ICD-10-CM

## 2021-10-20 DIAGNOSIS — M9903 Segmental and somatic dysfunction of lumbar region: Secondary | ICD-10-CM

## 2021-10-20 DIAGNOSIS — M9904 Segmental and somatic dysfunction of sacral region: Secondary | ICD-10-CM | POA: Diagnosis not present

## 2021-10-20 DIAGNOSIS — M25552 Pain in left hip: Secondary | ICD-10-CM

## 2021-10-20 DIAGNOSIS — M9902 Segmental and somatic dysfunction of thoracic region: Secondary | ICD-10-CM | POA: Diagnosis not present

## 2021-10-20 NOTE — Patient Instructions (Signed)
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

## 2021-10-20 NOTE — Assessment & Plan Note (Signed)
Doing relatively better overall.  Patient has felt some improvement with the range of motion, meloxicam and the Cymbalta.  Pain is a chronic problem with exacerbation that also responds fairly well to osteopathic manipulation.  Discussed icing regimen and home exercises.  Which activities to do which wants to avoid.  Follow-up again in 6 to 8 weeks otherwise.

## 2021-10-21 ENCOUNTER — Ambulatory Visit: Payer: 59

## 2021-10-26 ENCOUNTER — Other Ambulatory Visit: Payer: Self-pay | Admitting: Internal Medicine

## 2021-10-28 ENCOUNTER — Other Ambulatory Visit: Payer: Self-pay

## 2021-10-28 ENCOUNTER — Ambulatory Visit (INDEPENDENT_AMBULATORY_CARE_PROVIDER_SITE_OTHER): Payer: 59

## 2021-10-28 DIAGNOSIS — Z1231 Encounter for screening mammogram for malignant neoplasm of breast: Secondary | ICD-10-CM | POA: Diagnosis not present

## 2021-11-09 ENCOUNTER — Other Ambulatory Visit: Payer: Self-pay | Admitting: Internal Medicine

## 2021-11-23 ENCOUNTER — Other Ambulatory Visit: Payer: Self-pay | Admitting: Internal Medicine

## 2021-12-05 ENCOUNTER — Other Ambulatory Visit: Payer: Self-pay | Admitting: Internal Medicine

## 2021-12-08 NOTE — Progress Notes (Signed)
?[name] D.O. ?[facility] ?[place] ?Phone: [phone] ?Subjective:   ?I, Erica [name], am serving as a scribe for Dr. Hulan Saas. ? ?This visit occurred during the SARS-CoV-2 public health emergency.  Safety protocols were in place, including screening questions prior to the visit, additional usage of staff PPE, and extensive cleaning of exam room while observing appropriate contact time as indicated for disinfecting solutions.  ? ?I'm seeing this patient by the request  of:  Binnie Rail, MD ? ?CC: back and neck pain  ? ?[id number]  ?Erica [name] is a 41 y.o. female coming in with complaint of back and neck pain. OMT 10/20/2021. Patient states that she has more range of motion due to dry needling. Pain is more in the psoas especially with hip extension.  ? ?Medications patient has been prescribed: None ? ?Taking: ? ? ?  ? ? ? ? ?Reviewed prior external information including notes and imaging from previsou exam, outside providers and external EMR if available.  ? ?As well as notes that were available from care everywhere and other healthcare systems. ? ?Past medical history, social, surgical and family history all reviewed in electronic medical record.  No pertanent information unless stated regarding to the chief complaint.  ? ?Past Medical History:  ?Diagnosis Date  ? Allergy   ? Anxiety   ? Depression   ?  ?No Known Allergies ? ? ?Review of Systems: ? No headache, visual changes, nausea, vomiting, diarrhea, constipation, dizziness, abdominal pain, skin rash, fevers, chills, night sweats, weight loss, swollen lymph nodes, body aches, joint swelling, chest pain, shortness of breath, mood changes. POSITIVE muscle aches ? ?Objective  ?Blood pressure 110/78, pulse 90, height 5\' 7"  (1.702 m), weight 200 lb (90.7 kg), SpO2 97 %. ?  ?General: No apparent distress alert and oriented x3 mood and affect normal, dressed appropriately.  ?HEENT: Pupils equal,  extraocular movements intact  ?Respiratory: Patient's speak in full sentences and does not appear short of breath  ?Cardiovascular: No lower extremity edema, non tender, no erythema  ?low back exam does have some loss of lordosis.  Some tenderness to palpation in the paraspinal musculature.  Neuro:  back exam still shows the patient does have tightness noted in the paraspinal musculature.  Tightness noted on the left sacroiliac joint.  Tightness of the left FABER test compared to the right.  Negative straight leg test. ? ?Osteopathic findings ? ?C5 flexed rotated and side bent right ?C6 flexed rotated and side bent left ?T3 extended rotated and side bent right inhaled rib ?T9 extended rotated and side bent left ?L2 flexed rotated and side bent left ?Sacrum left on left ? ? ? ?  ?Assessment and Plan: ? ?Left hip pain ?Continues to have the labral pathology as well as the hip flexor tightness.  Patient now has made good response with losing of the weight as well as strengthening the core.  Encourage patient to continue to do this and stay active.  Discussed icing regimen and home exercises.  Increase activity as tolerated.  Follow-up again in 6 to 8 weeks. ?  ? ?Nonallopathic problems ? ?Decision today to treat with OMT was based on Physical Exam ? ?After verbal consent patient was treated with HVLA, ME, FPR techniques in cervical, rib, thoracic, lumbar, and sacral  areas ? ?Patient tolerated the procedure well with improvement in symptoms ? ?Patient given exercises, stretches and lifestyle modifications ? ?See medications in patient instructions if given ? ?  Patient will follow up in 4-8 weeks ? ?  ? ? ?The above documentation has been reviewed and is accurate and complete [name], DO ? ? ? ?  ? ? Note: This dictation was prepared with Dragon dictation along with smaller phrase technology. Any transcriptional errors that result from this process are unintentional.    ?  ?  ? ?

## 2021-12-14 ENCOUNTER — Encounter: Payer: Self-pay | Admitting: Family Medicine

## 2021-12-14 ENCOUNTER — Ambulatory Visit: Payer: 59 | Admitting: Family Medicine

## 2021-12-14 ENCOUNTER — Other Ambulatory Visit: Payer: Self-pay

## 2021-12-14 VITALS — BP 110/78 | HR 90 | Ht 67.0 in | Wt 200.0 lb

## 2021-12-14 DIAGNOSIS — M9901 Segmental and somatic dysfunction of cervical region: Secondary | ICD-10-CM | POA: Diagnosis not present

## 2021-12-14 DIAGNOSIS — M9902 Segmental and somatic dysfunction of thoracic region: Secondary | ICD-10-CM | POA: Diagnosis not present

## 2021-12-14 DIAGNOSIS — M25552 Pain in left hip: Secondary | ICD-10-CM | POA: Diagnosis not present

## 2021-12-14 DIAGNOSIS — M9908 Segmental and somatic dysfunction of rib cage: Secondary | ICD-10-CM | POA: Diagnosis not present

## 2021-12-14 DIAGNOSIS — M9904 Segmental and somatic dysfunction of sacral region: Secondary | ICD-10-CM

## 2021-12-14 DIAGNOSIS — M9903 Segmental and somatic dysfunction of lumbar region: Secondary | ICD-10-CM

## 2021-12-14 NOTE — Assessment & Plan Note (Signed)
Continues to have the labral pathology as well as the hip flexor tightness.  Patient now has made good response with losing of the weight as well as strengthening the core.  Encourage patient to continue to do this and stay active.  Discussed icing regimen and home exercises.  Increase activity as tolerated.  Follow-up again in 6 to 8 weeks. ?

## 2021-12-14 NOTE — Patient Instructions (Signed)
Good luck with tween  ?See me in 5-6 weeks ?

## 2021-12-23 ENCOUNTER — Other Ambulatory Visit: Payer: Self-pay | Admitting: Internal Medicine

## 2022-01-11 ENCOUNTER — Other Ambulatory Visit: Payer: Self-pay | Admitting: Internal Medicine

## 2022-01-24 ENCOUNTER — Other Ambulatory Visit: Payer: Self-pay | Admitting: Internal Medicine

## 2022-01-27 ENCOUNTER — Ambulatory Visit: Payer: 59 | Admitting: Obstetrics & Gynecology

## 2022-01-27 ENCOUNTER — Encounter: Payer: Self-pay | Admitting: Obstetrics & Gynecology

## 2022-01-27 VITALS — BP 120/82

## 2022-01-27 DIAGNOSIS — N9089 Other specified noninflammatory disorders of vulva and perineum: Secondary | ICD-10-CM

## 2022-01-27 DIAGNOSIS — Z113 Encounter for screening for infections with a predominantly sexual mode of transmission: Secondary | ICD-10-CM | POA: Diagnosis not present

## 2022-01-27 NOTE — Progress Notes (Signed)
? ? ?  Erica [name] [birth year] [id number] ? ? ?     41 y.o.  G1P1001 Stable boyfriend ? ?RP: Left vulvar irritation and swollen lump x 4 days ? ?HPI: Sexually active with her boyfriend last weekend.  Nicked the area while shaving recently.  Noticed a bump, mildly tender at the Left anterior vulva.  No drainage, no bleeding.  No fever. ? ? ?OB History  ?Gravida Para Term Preterm AB Living  ?1 1 1  0 0 1  ?SAB IAB Ectopic Multiple Live Births  ?0 0 0 0 1  ?  ?# Outcome Date GA Lbr Len/2nd Weight Sex Delivery Anes PTL Lv  ?1 Term 08/29/12 [redacted]w[redacted]d 06:22 / 00:14 7 lb 1.8 oz (3.226 kg) F Vag-Spont Local  LIV  ? ? ?Past medical history,surgical history, problem list, medications, allergies, family history and social history were all reviewed and documented in the EPIC chart. ? ? ?Directed ROS with pertinent positives and negatives documented in the history of present illness/assessment and plan. ? ?Exam: ? ?Vitals:  ? 01/27/22 0934  ?BP: 120/82  ? ?General appearance:  Normal ? ?Gynecologic exam: Vulva:  1 cm nodule with abrasion of the skin at the left anterior vulva.  HSV SureSwab done. Speculum:  IUD strings visible at the EO.  Gono-Chlam done. ? ? ?Assessment/Plan:  41 y.o. G1P1001  ? ?1. Vulvar lesion ?Sexually active with her boyfriend last weekend.  Nicked the area while shaving recently.  Noticed a bump, mildly tender at the Left anterior vulva.  No drainage, no bleeding.  No fever.  Likely a Sebaceous gland cyst with abrasion from shaving.  Recommend warm soaking.  Return for drainage if enlarges or becomes painful/red.  R/O HSV, but unlikely. ?- SureSwab HSV, Type 1/2 DNA, PCR ? ?2. Screen for STD (sexually transmitted disease) ?- SureSwab HSV, Type 1/2 DNA, PCR ?- C. trachomatis/N. gonorrhoeae RNA  ? ?45 MD, 9:46 AM 01/27/2022 ? ?

## 2022-01-28 ENCOUNTER — Encounter: Payer: Self-pay | Admitting: Obstetrics & Gynecology

## 2022-01-28 LAB — C. TRACHOMATIS/N. GONORRHOEAE RNA
C. trachomatis RNA, TMA: DETECTED — AB
N. gonorrhoeae RNA, TMA: NOT DETECTED

## 2022-02-01 ENCOUNTER — Other Ambulatory Visit: Payer: Self-pay

## 2022-02-01 ENCOUNTER — Other Ambulatory Visit: Payer: Self-pay | Admitting: Internal Medicine

## 2022-02-01 MED ORDER — DOXYCYCLINE HYCLATE 100 MG PO TBEC: 100.0000 mg | DELAYED_RELEASE_TABLET | Freq: Two times a day (BID) | ORAL | 0 refills | Status: DC

## 2022-02-02 LAB — SURESWAB HSV, TYPE 1/2 DNA, PCR
HSV 1 DNA: NOT DETECTED
HSV 2 DNA: NOT DETECTED

## 2022-02-02 NOTE — Telephone Encounter (Signed)
Check [place] registry last filled 12/25/2021../lm,b ? ?

## 2022-02-14 ENCOUNTER — Other Ambulatory Visit: Payer: Self-pay | Admitting: Internal Medicine

## 2022-02-14 ENCOUNTER — Telehealth: Payer: Self-pay

## 2022-02-14 DIAGNOSIS — F3289 Other specified depressive episodes: Secondary | ICD-10-CM

## 2022-02-14 DIAGNOSIS — F411 Generalized anxiety disorder: Secondary | ICD-10-CM

## 2022-02-14 NOTE — Telephone Encounter (Signed)
Pt is requesting a referral for mental health services like therapist. Current provider retired.   Please advise.

## 2022-02-14 NOTE — Progress Notes (Unsigned)
  [facility] [place] Phone: [phone] Subjective:   [name], am serving as a scribe for Dr. Hulan Saas.  This visit occurred during the SARS-CoV-2 public health emergency.  Safety protocols were in place, including screening questions prior to the visit, additional usage of staff PPE, and extensive cleaning of exam room while observing appropriate contact time as indicated for disinfecting solutions.    I'm seeing this patient by the request  of:  Binnie Rail, MD  CC: back and neck pain   [id number]  Daley Riggan Franzen is a 41 y.o. female coming in with complaint of back and neck pain. OMT on 3/21/203. Patient states that her ROM and pain are improving. Has flare ups intermittently.   Medications patient has been prescribed: None  Taking:         Reviewed prior external information including notes and imaging from previsou exam, outside providers and external EMR if available.   As well as notes that were available from care everywhere and other healthcare systems.  Past medical history, social, surgical and family history all reviewed in electronic medical record.  No pertanent information unless stated regarding to the chief complaint.   Past Medical History:  Diagnosis Date   Allergy    Anxiety    Depression     No Known Allergies   Review of Systems:  No headache, visual changes, nausea, vomiting, diarrhea, constipation, dizziness, abdominal pain, skin rash, fevers, chills, night sweats, weight loss, swollen lymph nodes, body aches, joint swelling, chest pain, shortness of breath, mood changes. POSITIVE muscle aches  Objective  Blood pressure (!) 120/92, pulse 66, height 5\' 7"  (1.702 m), last menstrual period 01/22/2022, SpO2 99 %.   General: No apparent distress alert and oriented x3 mood and affect normal, dressed appropriately.  HEENT: Pupils equal, extraocular movements intact   Respiratory: Patient's speak in full sentences and does not appear short of breath  Cardiovascular: No lower extremity edema, non tender, no erythema  Low back did have loss of lordosis tightness in the right Corky Sox negative SLT  Osteopathic findings  C2 flexed rotated and side bent right C5 flexed rotated and side bent left T3 extended rotated and side bent right inhaled rib T8 extended rotated and side bent left L2 flexed rotated and side bent right Sacrum right on right       Assessment and Plan:  Left hip pain Chronic but stable, improved strength no radiation and continues to be active meloxicam and cymbalta  RTC in 8-10 weeks     Nonallopathic problems  Decision today to treat with OMT was based on Physical Exam  After verbal consent patient was treated with HVLA, ME, FPR techniques in cervical, rib, thoracic, lumbar, and sacral  areas  Patient tolerated the procedure well with improvement in symptoms  Patient given exercises, stretches and lifestyle modifications  See medications in patient instructions if given  Patient will follow up in 4-8 weeks      The above documentation has been reviewed and is accurate and complete [name], DO       Note: This dictation was prepared with Dragon dictation along with smaller phrase technology. Any transcriptional errors that result from this process are unintentional.

## 2022-02-15 ENCOUNTER — Ambulatory Visit (INDEPENDENT_AMBULATORY_CARE_PROVIDER_SITE_OTHER): Payer: 59 | Admitting: Family Medicine

## 2022-02-15 VITALS — BP 120/92 | HR 66 | Ht 67.0 in

## 2022-02-15 DIAGNOSIS — M9908 Segmental and somatic dysfunction of rib cage: Secondary | ICD-10-CM | POA: Diagnosis not present

## 2022-02-15 DIAGNOSIS — M25552 Pain in left hip: Secondary | ICD-10-CM | POA: Diagnosis not present

## 2022-02-15 DIAGNOSIS — M9901 Segmental and somatic dysfunction of cervical region: Secondary | ICD-10-CM | POA: Diagnosis not present

## 2022-02-15 DIAGNOSIS — M9902 Segmental and somatic dysfunction of thoracic region: Secondary | ICD-10-CM | POA: Diagnosis not present

## 2022-02-15 DIAGNOSIS — M9904 Segmental and somatic dysfunction of sacral region: Secondary | ICD-10-CM | POA: Diagnosis not present

## 2022-02-15 DIAGNOSIS — M9903 Segmental and somatic dysfunction of lumbar region: Secondary | ICD-10-CM

## 2022-02-15 NOTE — Patient Instructions (Signed)
Good to see you Keep it up No changes See me in 3 months

## 2022-02-15 NOTE — Telephone Encounter (Signed)
Referral ordered for behavioral health °

## 2022-02-16 NOTE — Assessment & Plan Note (Signed)
Chronic but stable, improved strength no radiation and continues to be active meloxicam and cymbalta  RTC in 8-10 weeks

## 2022-03-13 ENCOUNTER — Other Ambulatory Visit: Payer: Self-pay | Admitting: Internal Medicine

## 2022-03-14 ENCOUNTER — Ambulatory Visit (INDEPENDENT_AMBULATORY_CARE_PROVIDER_SITE_OTHER): Payer: 59 | Admitting: Psychology

## 2022-03-14 DIAGNOSIS — F331 Major depressive disorder, recurrent, moderate: Secondary | ICD-10-CM | POA: Diagnosis not present

## 2022-03-14 DIAGNOSIS — F411 Generalized anxiety disorder: Secondary | ICD-10-CM | POA: Diagnosis not present
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
## 2022-03-15 NOTE — Telephone Encounter (Signed)
Check [place] registry last filled alprazolam 02/02/2022..[facility] Payment

## 2022-03-20 ENCOUNTER — Encounter: Payer: Self-pay | Admitting: Internal Medicine

## 2022-03-21 ENCOUNTER — Ambulatory Visit (INDEPENDENT_AMBULATORY_CARE_PROVIDER_SITE_OTHER): Payer: 59 | Admitting: Internal Medicine

## 2022-03-21 ENCOUNTER — Encounter: Payer: Self-pay | Admitting: Internal Medicine

## 2022-03-21 ENCOUNTER — Ambulatory Visit (INDEPENDENT_AMBULATORY_CARE_PROVIDER_SITE_OTHER): Payer: 59 | Admitting: Psychology

## 2022-03-21 VITALS — BP 142/88 | HR 69 | Temp 98.6°F | Ht 67.0 in | Wt 210.6 lb

## 2022-03-21 DIAGNOSIS — Z0000 Encounter for general adult medical examination without abnormal findings: Secondary | ICD-10-CM

## 2022-03-21 DIAGNOSIS — Z136 Encounter for screening for cardiovascular disorders: Secondary | ICD-10-CM

## 2022-03-21 DIAGNOSIS — F331 Major depressive disorder, recurrent, moderate: Secondary | ICD-10-CM | POA: Diagnosis not present

## 2022-03-21 DIAGNOSIS — F411 Generalized anxiety disorder: Secondary | ICD-10-CM | POA: Diagnosis not present

## 2022-03-21 DIAGNOSIS — F3289 Other specified depressive episodes: Secondary | ICD-10-CM | POA: Diagnosis not present

## 2022-03-21 LAB — CBC WITH DIFFERENTIAL/PLATELET
Basophils Absolute: 0 10*3/uL (ref 0.0–0.1)
Basophils Relative: 0.7 % (ref 0.0–3.0)
Eosinophils Absolute: 0.2 10*3/uL (ref 0.0–0.7)
Eosinophils Relative: 2.3 % (ref 0.0–5.0)
HCT: 40 % (ref 36.0–46.0)
Hemoglobin: 13.3 g/dL (ref 12.0–15.0)
Lymphocytes Relative: 27.1 % (ref 12.0–46.0)
Lymphs Abs: 1.8 10*3/uL (ref 0.7–4.0)
MCHC: 33.4 g/dL (ref 30.0–36.0)
MCV: 90.2 fl (ref 78.0–100.0)
Monocytes Absolute: 0.4 10*3/uL (ref 0.1–1.0)
Monocytes Relative: 6 % (ref 3.0–12.0)
Neutro Abs: 4.2 10*3/uL (ref 1.4–7.7)
Neutrophils Relative %: 63.9 % (ref 43.0–77.0)
Platelets: 268 10*3/uL (ref 150.0–400.0)
RBC: 4.43 Mil/uL (ref 3.87–5.11)
RDW: 13 % (ref 11.5–15.5)
WBC: 6.6 10*3/uL (ref 4.0–10.5)

## 2022-03-21 LAB — COMPREHENSIVE METABOLIC PANEL
ALT: 17 U/L (ref 0–35)
AST: 19 U/L (ref 0–37)
Albumin: 4.1 g/dL (ref 3.5–5.2)
Alkaline Phosphatase: 66 U/L (ref 39–117)
BUN: 13 mg/dL (ref 6–23)
CO2: 28 mEq/L (ref 19–32)
Calcium: 9 mg/dL (ref 8.4–10.5)
Chloride: 105 mEq/L (ref 96–112)
Creatinine, Ser: 0.82 mg/dL (ref 0.40–1.20)
GFR: 87.87 mL/min (ref >60.00)
Glucose, Bld: 97 mg/dL (ref 70–99)
Potassium: 4 mEq/L (ref 3.5–5.1)
Sodium: 139 mEq/L (ref 135–145)
Total Bilirubin: 0.7 mg/dL (ref 0.2–1.2)
Total Protein: 6.8 g/dL (ref 6.0–8.3)

## 2022-03-21 LAB — LIPID PANEL
Cholesterol: 215 mg/dL — ABNORMAL HIGH (ref 0–200)
HDL: 43.2 mg/dL (ref >39.00)
LDL Cholesterol: 147 mg/dL — ABNORMAL HIGH (ref 0–99)
NonHDL: 171.94
Total CHOL/HDL Ratio: 5
Triglycerides: 127 mg/dL (ref 0.0–149.0)
VLDL: 25.4 mg/dL (ref 0.0–40.0)

## 2022-03-21 LAB — TSH: TSH: 2.12 u[IU]/mL (ref 0.35–5.50)

## 2022-03-21 NOTE — Assessment & Plan Note (Addendum)
Chronic Anxiety overall is still little heightened from everything that happened in the past year Overall she feels her anxiety is fairly stable, but not really ideally controlled Discussed possibly increasing duloxetine to 90 mg daily-she will think about this Continue duloxetine 60 mg daily

## 2022-03-28 ENCOUNTER — Ambulatory Visit: Payer: 59 | Admitting: Psychology

## 2022-04-04 ENCOUNTER — Ambulatory Visit (INDEPENDENT_AMBULATORY_CARE_PROVIDER_SITE_OTHER): Payer: 59 | Admitting: Psychology

## 2022-04-04 DIAGNOSIS — F411 Generalized anxiety disorder: Secondary | ICD-10-CM

## 2022-04-04 DIAGNOSIS — F331 Major depressive disorder, recurrent, moderate: Secondary | ICD-10-CM | POA: Diagnosis not present

## 2022-04-04 NOTE — Progress Notes (Signed)
[place]   PROGRESS NOTE  Name: Nicha Hemann Gaubert Date: 04/04/2022 MRN: [id number] DOB: [birth year] PCP: Binnie Rail, MD  Time spent: 3:00-3:55 PM  Today I met with  Uvaldo Bristle Shrestha in remote video (WebEx) face-to-face individual psychotherapy.   Distance Site: Client's Home Orginating Site: Dr Jannifer Franklin Remote Office Consent: Obtained verbal consent to transmit  session remotely    Reason for Visit /Presenting Problem: Ramsey Plack will be 42 this next week.  She has been treated for depression since she was a teenager.  Early on she learned skills for how to deal with her depression.  She came to [facility] in 2007 for graduate school.  In 2009 she sought treatment with Trey Paula.   Diagnosis: Major Depressive Disorder, Recurrent, Moderate   Individualized Treatment Plan       Strengths: intelligent, quick witted, funny, resourceful, people oriented, kind, adaptable  Supports: parents, 2 siblings, friends   Goal/Needs for Treatment:  In order of importance to patient 1) Learn and implement positive coping skills to decrease depression in order to feel less mentally exhausted. 2) Learn and implement positive coping skills to decrease anxiety in order to feel less mentally exhausted. 3) Process the trauma of the divorce and grieve the loss of the marriage    Client Statement of Needs: Pt states she needs "to be less mentally exhausted all the time and have energy to do things she wants to do."  Address the trauma of the divorce which she hasn't fully processed.  She wants to enjoy her life and worry less about all the things that aren't "right or haven't gone the way she expected."      Treatment Level:Weekly Outpatient Individual Psychotherapy  Symptoms: c/o that while depression is better managed, she has constant anxiety.  Hypervigilance, nervous, constantly worrying about everything, over thinking,   She has a "weird relationship with food."  She  doesn't feel hunger when she should because her stomach holds a lot of tension.  Often she won't eat until she feels faint or nauseous.  Sleep if discontinuous, initial sleep is good.  She sweats at night (not perimenopausal), has nightmares.  Struggles with fatigue, motivation, trouble getting started.  Client Treatment Preferences: Referred to this therapist by her previous therapist who retired the Museum/gallery exhibitions officer consumer's goal for treatment:  Psychologist, Royetta Crochet, [name].,  will support the patient's ability to achieve the goals identified. Cognitive Behavioral Therapy, Dialectical Behavioral Therapy, Motivational Interviewing, Behavior Activation, parent training, and other evidenced-based practices will be used to promote progress towards healthy functioning.   Healthcare consumer Oria Gehring will: Actively participate in therapy, working towards healthy functioning.    *Justification for Continuation/Discontinuation of Goal: R=Revised, O=Ongoing, A=Achieved, D=Discontinued  Goal 1) Learn and implement coping skills to decrease depression in order to feel less mentally exhausted  5 Point Likert rating baseline date: Target Date Goal Was reviewed Status Code Progress towards goal/Likert rating  03/22/2023           O              Goal 2) Learn and implement positive coping skills to decrease anxiety in order to feel less mentally exhausted  5 Point Likert rating baseline date: Target Date Goal Was reviewed Status Code Progress towards goal/Likert rating  03/22/2023            O  Goal 3) Process the trauma of the divorce and grieve the loss of the marriage  5 Point Likert rating baseline date: Target Date Goal Was reviewed Status Code Progress towards goal/Likert rating  03/22/2023          O               This plan has been reviewed and created by the following participants:  This plan will be reviewed at least every 12 months. Date Behavioral  Health Clinician Date Guardian/Patient   03/21/2022 Royetta Crochet, [name]  03/21/2022 [name]                    Torra reports that she purchased The Anxiety and Phobia Workbook and DBT Workbook.  She f/t and read the introductions in both workbooks.  She was able to identify with living in a state of hyper arousal.  I provided some psycho education about DBT and began to introduce distress tolerance skills.  I outlined the REST concept and assigned her some practice exercises.    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

## 2022-04-06 ENCOUNTER — Encounter: Payer: Self-pay | Admitting: Obstetrics & Gynecology

## 2022-04-06 ENCOUNTER — Ambulatory Visit (INDEPENDENT_AMBULATORY_CARE_PROVIDER_SITE_OTHER): Payer: 59 | Admitting: Obstetrics & Gynecology

## 2022-04-06 VITALS — BP 110/74 | HR 53

## 2022-04-06 DIAGNOSIS — Z113 Encounter for screening for infections with a predominantly sexual mode of transmission: Secondary | ICD-10-CM | POA: Diagnosis not present

## 2022-04-06 DIAGNOSIS — Z8619 Personal history of other infectious and parasitic diseases: Secondary | ICD-10-CM

## 2022-04-06 NOTE — Progress Notes (Signed)
    Erica [name] 06/20/1981 [id number]        41 y.o.  G1P1001   RP: Chlamydia Positive in 01/2022 for TOC  HPI: Chlamydia Positive on 01/27/22.  Treated with Doxycycline 100 mg PO x BID x 10 days.  Not sexually active since treatment.  Well on the Mirena IUD.  Has PMS, but no vaginal bleeding.  Some pelvic cramping.  No pelvic pain.  No vaginal d/c.  No fever.   OB History  Gravida Para Term Preterm AB Living  1 1 1  0 0 1  SAB IAB Ectopic Multiple Live Births  0 0 0 0 1    # Outcome Date GA Lbr Len/2nd Weight Sex Delivery Anes PTL Lv  1 Term 08/29/12 [redacted]w[redacted]d 06:22 / 00:14 7 lb 1.8 oz (3.226 kg) F Vag-Spont Local  LIV    Past medical history,surgical history, problem list, medications, allergies, family history and social history were all reviewed and documented in the EPIC chart.   Directed ROS with pertinent positives and negatives documented in the history of present illness/assessment and plan.  Exam:  Vitals:   04/06/22 1131  BP: 110/74  Pulse: (!) 53  SpO2: 99%   General appearance:  Normal  Abdomen: Normal  Gynecologic exam: Vulva normal with a small sebaceous cyst on the Rt mid vulva.  Speculum:  Cervix normal. Gono-Chlam done.  IUD strings visible at [facility]. Vagina normal.  Secretions normal.   Assessment/Plan:  41 y.o. G1P1001   1. H/O chlamydia infection Chlamydia Positive on 01/27/22.  Treated with Doxycycline 100 mg PO x BID x 10 days.  Not sexually active since treatment.  Well on the Mirena IUD.  Has PMS, but no vaginal bleeding.  Some pelvic cramping.  No pelvic pain.  No vaginal d/c.  No fever.  Normal Gyn exam.  Chlam/Gono tested.  Rest of STI screen also done.  Management per results. - HIV antibody (with reflex) - RPR - Hepatitis B Surface AntiGEN - Hepatitis C Antibody - C. trachomatis/N. gonorrhoeae RNA  2. Screen for STD (sexually transmitted disease) - HIV antibody (with reflex) - RPR - Hepatitis B Surface AntiGEN - Hepatitis C Antibody - C.  trachomatis/N. gonorrhoeae RNA   03/29/22 MD, 12:09 PM 04/06/2022

## 2022-04-07 LAB — HEPATITIS C ANTIBODY: Hepatitis C Ab: NONREACTIVE

## 2022-04-07 LAB — HIV ANTIBODY (ROUTINE TESTING W REFLEX): HIV 1&2 Ab, 4th Generation: NONREACTIVE

## 2022-04-07 LAB — SYPHILIS: RPR W/REFLEX TO RPR TITER AND TREPONEMAL ANTIBODIES, TRADITIONAL SCREENING AND DIAGNOSIS ALGORITHM: RPR Ser Ql: NONREACTIVE

## 2022-04-07 LAB — HEPATITIS B SURFACE ANTIGEN: Hepatitis B Surface Ag: NONREACTIVE

## 2022-04-07 LAB — C. TRACHOMATIS/N. GONORRHOEAE RNA
C. trachomatis RNA, TMA: NOT DETECTED
N. gonorrhoeae RNA, TMA: NOT DETECTED

## 2022-04-11 ENCOUNTER — Telehealth: Payer: Self-pay | Admitting: Internal Medicine

## 2022-04-11 ENCOUNTER — Ambulatory Visit (INDEPENDENT_AMBULATORY_CARE_PROVIDER_SITE_OTHER): Payer: 59 | Admitting: Psychology

## 2022-04-11 ENCOUNTER — Other Ambulatory Visit: Payer: Self-pay

## 2022-04-11 DIAGNOSIS — F331 Major depressive disorder, recurrent, moderate: Secondary | ICD-10-CM

## 2022-04-11 MED ORDER — DULOXETINE HCL 60 MG PO CPEP: 60.0000 mg | ORAL_CAPSULE | Freq: Every day | ORAL | 0 refills | Status: DC

## 2022-04-11 NOTE — Progress Notes (Signed)
[place]   PROGRESS NOTE  Name: Erica [name] Date: 04/11/2022 MRN: [id number] DOB: Jul 10, 1981 PCP: Binnie Rail, MD  Time spent: 3:00-3:55 PM  Today I met with  Erica [name] in remote video (WebEx) face-to-face individual psychotherapy.   Distance Site: Client's Home Orginating Site: Dr Jannifer Franklin Remote Office Consent: Obtained verbal consent to transmit  session remotely    Reason for Visit /Presenting Problem: Erica [name] will be 42 this next week.  She has been treated for depression since she was a teenager.  Early on she learned skills for how to deal with her depression.  She came to [facility] in 2007 for graduate school.  In 2009 she sought treatment with Erica [name].   Diagnosis: Major Depressive Disorder, Recurrent, Moderate   Individualized Treatment Plan       Strengths: intelligent, quick witted, funny, resourceful, people oriented, kind, adaptable  Supports: parents, 2 siblings, friends   Goal/Needs for Treatment:  In order of importance to patient 1) Learn and implement positive coping skills to decrease depression in order to feel less mentally exhausted. 2) Learn and implement positive coping skills to decrease anxiety in order to feel less mentally exhausted. 3) Process the trauma of the divorce and grieve the loss of the marriage    Client Statement of Needs: Pt states she needs "to be less mentally exhausted all the time and have energy to do things she wants to do."  Address the trauma of the divorce which she hasn't fully processed.  She wants to enjoy her life and worry less about all the things that aren't "right or haven't gone the way she expected."      Treatment Level:Weekly Outpatient Individual Psychotherapy  Symptoms: c/o that while depression is better managed, she has constant anxiety.  Hypervigilance, nervous, constantly worrying about everything, over thinking,   She has a "weird relationship with food."  She  doesn't feel hunger when she should because her stomach holds a lot of tension.  Often she won't eat until she feels faint or nauseous.  Sleep if discontinuous, initial sleep is good.  She sweats at night (not perimenopausal), has nightmares.  Struggles with fatigue, motivation, trouble getting started.  Client Treatment Preferences: Referred to this therapist by her previous therapist who retired the Museum/gallery exhibitions officer consumer's goal for treatment:  Psychologist, Erica [name], Erica [name].,  will support the patient's ability to achieve the goals identified. Cognitive Behavioral Therapy, Dialectical Behavioral Therapy, Motivational Interviewing, Behavior Activation, parent training, and other evidenced-based practices will be used to promote progress towards healthy functioning.   [name] will: Actively participate in therapy, working towards healthy functioning.    *Justification for Continuation/Discontinuation of Goal: R=Revised, O=Ongoing, A=Achieved, D=Discontinued  Goal 1) Learn and implement coping skills to decrease depression in order to feel less mentally exhausted  5 Point Likert rating baseline date: Target Date Goal Was reviewed Status Code Progress towards goal/Likert rating  03/22/2023           O              Goal 2) Learn and implement positive coping skills to decrease anxiety in order to feel less mentally exhausted  5 Point Likert rating baseline date: Target Date Goal Was reviewed Status Code Progress towards goal/Likert rating  03/22/2023            O  Goal 3) Process the trauma of the divorce and grieve the loss of the marriage  5 Point Likert rating baseline date: Target Date Goal Was reviewed Status Code Progress towards goal/Likert rating  03/22/2023          O               This plan has been reviewed and created by the following participants:  This plan will be reviewed at least every 12 months. Date Behavioral  Health Clinician Date Guardian/Patient   03/21/2022 Erica [name], Erica [name]  03/21/2022 Erica [name]                    Erica [name] reports that her daughter has been vacationing with her father and his family.   He is not good at communicating and she is uncertain when they will return and she has found herself feeling triggered.  In session, we practiced the REST technique and were able to calm her angry and anxious feelings.  We d/e her the readings I assigned her which involved self destructive coping strategies and their cost.  I provided psycho education about the concept of radical acceptance and introduced distract skills.      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

## 2022-04-11 NOTE — Telephone Encounter (Signed)
Sent in today 

## 2022-04-11 NOTE — Telephone Encounter (Signed)
Caller & Relationship to patient: [name]  Call back number: [phone]  Date of last office visit: 03/21/22  Date of next office visit:   Medication(s) to be refilled:  DULoxetine (CYMBALTA) 60 MG capsule   Preferred Pharmacy:  [facility] [phone] - [place], [place] - [address] AT [facility] [address] [facility] Phone:  [phone]  Fax:  [phone]

## 2022-04-18 ENCOUNTER — Ambulatory Visit (INDEPENDENT_AMBULATORY_CARE_PROVIDER_SITE_OTHER): Payer: 59 | Admitting: Psychology

## 2022-04-18 DIAGNOSIS — F331 Major depressive disorder, recurrent, moderate: Secondary | ICD-10-CM

## 2022-04-18 NOTE — Progress Notes (Signed)
[place]   PROGRESS NOTE  Name: Erica [name] Date: 04/18/2022 MRN: [id number] DOB: 03/09/1981 PCP: Erica Rail, MD  Time spent: 3:00-3:55 PM  Today I met with  Erica [name] in remote video (WebEx) face-to-face individual psychotherapy.   Distance Site: Client's Home Orginating Site: Dr Erica [name] Remote Office Consent: Obtained verbal consent to transmit  session remotely    Reason for Visit /Presenting Problem: Erica [name].  She has been treated for depression since she was a teenager.  Early on she learned skills for how to deal with her depression.  She came to [facility] in 2007 for graduate school.  In 2009 she sought treatment with Erica [name].   Diagnosis: Major Depressive [name], Recurrent, Moderate   Individualized Treatment Plan       Strengths: intelligent, quick witted, funny, resourceful, people oriented, kind, adaptable  Supports: parents, 2 siblings, friends   Goal/Needs for Treatment:  In order of importance to patient 1) Learn and implement positive coping skills to decrease depression in order to feel less mentally exhausted. 2) Learn and implement positive coping skills to decrease anxiety in order to feel less mentally exhausted. 3) Process the trauma of the divorce and grieve the loss of the marriage    Client Statement of Needs: Pt states she needs "to be less mentally exhausted all the time and have energy to do things she wants to do."  Address the trauma of the divorce which she hasn't fully processed.  She wants to enjoy her life and worry less about all the things that aren't "right or haven't gone the way she expected."      Treatment Level:Weekly Outpatient Individual Psychotherapy  Symptoms: c/o that while depression is better managed, she has constant anxiety.  Hypervigilance, nervous, constantly worrying about everything, over thinking,   She has a "weird relationship with food."   She doesn't feel hunger when she should because her stomach holds a lot of tension.  Often she won't eat until she feels faint or nauseous.  Sleep if discontinuous, initial sleep is good.  She sweats at night (not perimenopausal), has nightmares.  Struggles with fatigue, motivation, trouble getting started.  Client Treatment Preferences: Referred to this therapist by her previous therapist who retired the Erica [name] consumer's goal for treatment:  Psychologist, Erica [name].,  will support the patient's ability to achieve the goals identified. Cognitive Behavioral Therapy, Dialectical Behavioral Therapy, Motivational Interviewing, Behavior Activation, parent training, and other evidenced-based practices will be used to promote progress towards healthy functioning.   [name] will: Actively participate in therapy, working towards healthy functioning.    *Justification for Continuation/Discontinuation of Goal: R=Revised, O=Ongoing, A=Achieved, D=Discontinued  Goal 1) Learn and implement coping skills to decrease depression in order to feel less mentally exhausted  5 Point Likert rating baseline date: Target Date Goal Was reviewed Status Code Progress towards goal/Likert rating  03/22/2023           O              Goal 2) Learn and implement positive coping skills to decrease anxiety in order to feel less mentally exhausted  5 Point Likert rating baseline date: Target Date Goal Was reviewed Status Code Progress towards goal/Likert rating  03/22/2023            O  Goal 3) Process the trauma of the divorce and grieve the loss of the marriage  5 Point Likert rating baseline date: Target Date Goal Was reviewed Status Code Progress towards goal/Likert rating  03/22/2023          O               This plan has been reviewed and created by the following participants:  This plan will be reviewed at least every 12 months. Date Behavioral  Health Clinician Date Guardian/Patient   03/21/2022 Erica [name]  03/21/2022 Erica [name]                    Erica [name].  We d/p what her return was like and difficult news she received from her ex-mother-in-law.  Erica [name] states that it was eye opening when I labeled her "eating habits" as self-destructive.  She says she was upset but called a friend to talk her through it.  We talked about routines around meals, food shopping and cooking.  We talked about how to use the REST strategy when she gets stuck around food issues. We talked  about Erica [name] and Erica [name].  I provided some psycho education related to Erica [name], her mother's history with Erica [name] and her [name]'s Erica [name].  Erica [name] endorsed feeling [name], constantly or easily agitated and irritable.  We agreed that it would be in order for her to get a more thorough evaluation and e/  the possibility of a different medication regime if indicated.   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

## 2022-04-25 ENCOUNTER — Ambulatory Visit (INDEPENDENT_AMBULATORY_CARE_PROVIDER_SITE_OTHER): Payer: 59 | Admitting: Psychology

## 2022-04-25 DIAGNOSIS — F331 Major depressive disorder, recurrent, moderate: Secondary | ICD-10-CM | POA: Diagnosis not present

## 2022-04-25 NOTE — Progress Notes (Signed)
[place]   PROGRESS NOTE  Name: Erica [name] Date: 04/25/2022 MRN: [id number] DOB: [birth year] PCP: Binnie Rail, MD  Time spent: 3:00-3:55 PM  Today I met with  Erica [name] in remote video (WebEx) face-to-face individual psychotherapy.   Distance Site: Client's Home Orginating Site: Dr Jannifer Franklin Remote Office Consent: Obtained verbal consent to transmit  session remotely    Reason for Visit /Presenting Problem: Erica [name] will be 42 this next week.  She has been treated for depression since she was a teenager.  Early on she learned skills for how to deal with her depression.  She came to [facility] in 2007 for graduate school.  In 2009 she sought treatment with Trey Paula.   Diagnosis: Major Depressive Disorder, Recurrent, Moderate   Individualized Treatment Plan       Strengths: intelligent, quick witted, funny, resourceful, people oriented, kind, adaptable  Supports: parents, 2 siblings, friends   Goal/Needs for Treatment:  In order of importance to patient 1) Learn and implement positive coping skills to decrease depression in order to feel less mentally exhausted. 2) Learn and implement positive coping skills to decrease anxiety in order to feel less mentally exhausted. 3) Process the trauma of the divorce and grieve the loss of the marriage    Client Statement of Needs: Pt states she needs "to be less mentally exhausted all the time and have energy to do things she wants to do."  Address the trauma of the divorce which she hasn't fully processed.  She wants to enjoy her life and worry less about all the things that aren't "right or haven't gone the way she expected."      Treatment Level:Weekly Outpatient Individual Psychotherapy  Symptoms: c/o that while depression is better managed, she has constant anxiety.  Hypervigilance, nervous, constantly worrying about everything, over thinking,   She has a "weird relationship with food."  She  doesn't feel hunger when she should because her stomach holds a lot of tension.  Often she won't eat until she feels faint or nauseous.  Sleep if discontinuous, initial sleep is good.  She sweats at night (not perimenopausal), has nightmares.  Struggles with fatigue, motivation, trouble getting started.  Client Treatment Preferences: Referred to this therapist by her previous therapist who retired the Museum/gallery exhibitions officer consumer's goal for treatment:  Psychologist, Royetta Crochet, [name].,  will support the patient's ability to achieve the goals identified. Cognitive Behavioral Therapy, Dialectical Behavioral Therapy, Motivational Interviewing, Behavior Activation, parent training, and other evidenced-based practices will be used to promote progress towards healthy functioning.   [name] will: Actively participate in therapy, working towards healthy functioning.    *Justification for Continuation/Discontinuation of Goal: R=Revised, O=Ongoing, A=Achieved, D=Discontinued  Goal 1) Learn and implement coping skills to decrease depression in order to feel less mentally exhausted  5 Point Likert rating baseline date: Target Date Goal Was reviewed Status Code Progress towards goal/Likert rating  03/22/2023           O              Goal 2) Learn and implement positive coping skills to decrease anxiety in order to feel less mentally exhausted  5 Point Likert rating baseline date: Target Date Goal Was reviewed Status Code Progress towards goal/Likert rating  03/22/2023            O  Goal 3) Process the trauma of the divorce and grieve the loss of the marriage  5 Point Likert rating baseline date: Target Date Goal Was reviewed Status Code Progress towards goal/Likert rating  03/22/2023          O               This plan has been reviewed and created by the following participants:  This plan will be reviewed at least every 12 months. Date Behavioral  Health Clinician Date Guardian/Patient   03/21/2022 Royetta Crochet, [name]  03/21/2022 [name]                    Naje reports that it was a revelation to her that ADHD has had such a big impact on her life got her thinking a lot (ie., over eating because it was good, under eating because hyper focused on something else.).  She talked to her parents and had a break through with them.  She f/t and made an appointment with [facility] in the middle of the month.    Nieve has some vacation time before the Fall season begins.  We talked about vacation and whether she is able to relax and use the time well.  I encouraged her to create some quiet time for her self while on vacation and to consider it practice for when she returns home.    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

## 2022-05-02 ENCOUNTER — Ambulatory Visit: Payer: 59 | Admitting: Psychology

## 2022-05-09 ENCOUNTER — Ambulatory Visit: Payer: 59 | Admitting: Psychology

## 2022-05-09 DIAGNOSIS — F902 Attention-deficit hyperactivity disorder, combined type: Secondary | ICD-10-CM | POA: Insufficient documentation

## 2022-05-10 ENCOUNTER — Ambulatory Visit (INDEPENDENT_AMBULATORY_CARE_PROVIDER_SITE_OTHER): Payer: 59 | Admitting: Psychology

## 2022-05-10 DIAGNOSIS — F331 Major depressive disorder, recurrent, moderate: Secondary | ICD-10-CM | POA: Diagnosis not present

## 2022-05-10 NOTE — Progress Notes (Signed)
 PROGRESS NOTE  Name: Erica [name] Date: 05/10/2022 MRN: [id number] DOB: 04/23/1981 PCP: Burns, Stacy J, MD  Time spent: 8:00-8:55 AM  Today I met with  Erica [name] in remote video (WebEx) face-to-face individual psychotherapy.   Distance Site: Client's Home Orginating Site: Dr 's Remote Office Consent: Obtained verbal consent to transmit  session remotely    Reason for Visit /Presenting Problem: Erica [name] will be 42 this next week.  She has been treated for depression since she was a teenager.  Early on she learned skills for how to deal with her depression.  She came to [place] in 2007 for graduate school.  In 2009 she sought treatment with Lisa Flores.  Following Lisa Flores' retirement, she wasn't ready to start treatment again until now.  Diagnosis: Major Depressive Disorder, Recurrent, Moderate   Individualized Treatment Plan       Strengths: intelligent, quick witted, funny, resourceful, people oriented, kind, adaptable  Supports: parents, 2 siblings, friends   Goal/Needs for Treatment:  In order of importance to patient 1) Learn and implement positive coping skills to decrease depression in order to feel less mentally exhausted. 2) Learn and implement positive coping skills to decrease anxiety in order to feel less mentally exhausted. 3) Process the trauma of the divorce and grieve the loss of the marriage    Client Statement of Needs: Pt states she needs "to be less mentally exhausted all the time and have energy to do things she wants to do."  Address the trauma of the divorce which she hasn't fully processed.  She wants to enjoy her life and worry less about all the things that aren't "right or haven't gone the way she expected."      Treatment Level:Weekly Outpatient Individual Psychotherapy  Symptoms: c/o that while depression is better managed, she has constant anxiety.  Hypervigilance, nervous, constantly worrying about everything, over thinking,    She has a "weird relationship with food."  She doesn't feel hunger when she should because her stomach holds a lot of tension.  Often she won't eat until she feels faint or nauseous.  Sleep if discontinuous, initial sleep is good.  She sweats at night (not perimenopausal), has nightmares.  Struggles with fatigue, motivation, trouble getting started.  Client Treatment Preferences: Referred to this therapist by her previous therapist who retired the practice   Healthcare consumer's goal for treatment:  Psychologist,  Erica [name] , [name].,  will support the patient's ability to achieve the goals identified. Cognitive Behavioral Therapy, Dialectical Behavioral Therapy, Motivational Interviewing, Behavior Activation, parenting skills and other evidenced-based practices will be used to promote progress towards healthy functioning.   Healthcare consumer Danise Dase will: Actively participate in therapy, working towards healthy functioning.    *Justification for Continuation/Discontinuation of Goal: R=Revised, O=Ongoing, A=Achieved, D=Discontinued  Goal 1) Learn and implement coping skills to decrease depression in order to feel less mentally exhausted  5 Point Likert rating baseline date: Target Date Goal Was reviewed Status Code Progress towards goal/Likert rating  03/22/2023           O              Goal 2) Learn and implement positive coping skills to decrease anxiety in order to feel less mentally exhausted  5 Point Likert rating baseline date: Target Date Goal Was reviewed Status Code Progress towards goal/Likert rating  03/22/2023            O                Goal 3) Process the trauma of the divorce and grieve the loss of the marriage  5 Point Likert rating baseline date: Target Date Goal Was reviewed Status Code Progress towards goal/Likert rating  03/22/2023          O               This plan has been reviewed and created by the following participants:  This plan will be  reviewed at least every 12 months. Date Behavioral Health Clinician Date Guardian/Patient   03/21/2022  Erica [name] , [name]  03/21/2022 Erica [name]                    Erica [name] reports that she enjoyed a couple of days at the beach with her daughter and a friend and her son.  She then spent several stressful days vacationing at her parent's home.  We d/e/p the conflicts that arose and related family dynamics.  She f/t and made an appointment with [facility] in the middle of the month.      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  

## 2022-05-16 ENCOUNTER — Ambulatory Visit (INDEPENDENT_AMBULATORY_CARE_PROVIDER_SITE_OTHER): Payer: 59 | Admitting: Psychology

## 2022-05-16 DIAGNOSIS — F331 Major depressive disorder, recurrent, moderate: Secondary | ICD-10-CM | POA: Diagnosis not present

## 2022-05-16 NOTE — Progress Notes (Signed)
PROGRESS NOTE  Name: Erica [name] Date: 05/16/2022 MRN: [id number] DOB: 09/06/1981 PCP: Binnie Rail, MD  Time spent: 8:00-8:55 AM  Today I met with  Erica [name] in remote video (WebEx) face-to-face individual psychotherapy.   Distance Site: Client's Home Orginating Site: Dr Jannifer Franklin Remote Office Consent: Obtained verbal consent to transmit  session remotely    Reason for Visit /Presenting Problem: Erica [name] will be 42 this next week.  Erica [name] has been treated for depression since Erica [name] was a teenager.  Early on Erica [name] learned skills for how to deal with her depression.  Erica [name] came to [place] [facility] in 2007 for graduate school.  In 2009 Erica [name] sought treatment with Trey Paula.  Following Trey Paula' retirement, Erica [name] wasn't ready to start treatment again until now.  Diagnosis: Major Depressive Disorder, Recurrent, Moderate   Individualized Treatment Plan       Strengths: intelligent, quick witted, funny, resourceful, people oriented, kind, adaptable  Supports: parents, 2 siblings, friends   Goal/Needs for Treatment:  In order of importance to patient 1) Learn and implement positive coping skills to decrease depression in order to feel less mentally exhausted. 2) Learn and implement positive coping skills to decrease anxiety in order to feel less mentally exhausted. 3) Process the trauma of the divorce and grieve the loss of the marriage    Client Statement of Needs: Erica [name] states Erica [name] needs "to be less mentally exhausted all the time and have energy to do things Erica [name] wants to do."  Address the trauma of the divorce which Erica [name] hasn't fully processed.  Erica [name] wants to enjoy her life and worry less about all the things that aren't "right or haven't gone the way Erica [name] expected."      Treatment Level:Weekly Outpatient Individual Psychotherapy  Symptoms: c/o that while depression is better managed, Erica [name] has constant anxiety.  Hypervigilance, nervous, constantly worrying about everything, over thinking,    Erica [name] has a "weird relationship with food."  Erica [name] doesn't feel hunger when Erica [name] should because her stomach holds a lot of tension.  Often Erica [name] won't eat until Erica [name] feels faint or nauseous.  Sleep if discontinuous, initial sleep is good.  Erica [name] sweats at night (not perimenopausal), has nightmares.  Struggles with fatigue, motivation, trouble getting started.  Client Treatment Preferences: Referred to this therapist by her previous therapist who retired the Museum/gallery exhibitions officer consumer's goal for treatment:  Psychologist, Royetta Crochet, [name].,  will support the patient's ability to achieve the goals identified. Cognitive Behavioral Therapy, Dialectical Behavioral Therapy, Motivational Interviewing, Behavior Activation, parenting skills and other evidenced-based practices will be used to promote progress towards healthy functioning.   [name] will: Actively participate in therapy, working towards healthy functioning.    *Justification for Continuation/Discontinuation of Goal: R=Revised, O=Ongoing, A=Achieved, D=Discontinued  Goal 1) Learn and implement coping skills to decrease depression in order to feel less mentally exhausted  5 Point Likert rating baseline date: Target Date Goal Was reviewed Status Code Progress towards goal/Likert rating  03/22/2023           O              Goal 2) Learn and implement positive coping skills to decrease anxiety in order to feel less mentally exhausted  5 Point Likert rating baseline date: Target Date Goal Was reviewed Status Code Progress towards goal/Likert rating  03/22/2023            O  Goal 3) Process the trauma of the divorce and grieve the loss of the marriage  5 Point Likert rating baseline date: Target Date Goal Was reviewed Status Code Progress towards goal/Likert rating  03/22/2023          O               This plan has been reviewed and created by the following participants:  This plan will be  reviewed at least every 12 months. Date Behavioral Health Clinician Date Guardian/Patient   03/21/2022 Royetta Crochet, [name]  03/21/2022 Erica [name]                    Erica [name] reports that Erica [name] is feeling good about the new semester.  Erica [name] shared that Erica [name] did some additional reading in her DBT workbook.  Erica [name] realized that romantic relationships have consistently been a problem in her life (ie., people pleasing, separation anxiety, fear of abandonment).  We talked about her early history and possible contributing factors to her anxiety and fears of abandonment.  Basically her mother was very overwhelmed raising three children effectively on her own since her father was a "raging alcoholic."  Erica [name] recalls being a very lonely child, ran away when Erica [name] was 77 and had suicidal thoughts when Erica [name] was only 41 years old.  In the middle of talking Erica [name] had an insight about what makes her afraid to "rock the boat."  that her mother would ferociously yell at them when Erica [name] became angry.  I d/ with her the implications of not having her reality validated and not trusting  her own experience.    We d/e/p what the last relationship was like and conducted a "post mortem."      Erica [name] f/t and made an appointment with [facility].  Erica [name] states that Erica [name] started on Strattera.     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

## 2022-05-20 ENCOUNTER — Ambulatory Visit: Payer: Medicaid Other | Admitting: Family Medicine

## 2022-05-23 ENCOUNTER — Ambulatory Visit: Payer: 59 | Admitting: Psychology

## 2022-05-23 ENCOUNTER — Ambulatory Visit (INDEPENDENT_AMBULATORY_CARE_PROVIDER_SITE_OTHER): Payer: 59 | Admitting: Psychology

## 2022-05-23 DIAGNOSIS — F411 Generalized anxiety disorder: Secondary | ICD-10-CM

## 2022-05-23 DIAGNOSIS — F902 Attention-deficit hyperactivity disorder, combined type: Secondary | ICD-10-CM | POA: Diagnosis not present

## 2022-05-25 NOTE — Progress Notes (Signed)
PROGRESS NOTE  Name: Erica [name] Date: 05/25/2022 MRN: [id number] DOB: 1981/09/25 PCP: Binnie Rail, MD  Time spent: 8:00-8:55 AM  Today I met with  Erica [name] in remote video (WebEx) face-to-face individual psychotherapy.   Distance Site: Client's Home Orginating Site: Dr Jannifer Franklin Remote Office Consent: Obtained verbal consent to transmit  session remotely    Reason for Visit /Presenting Problem: Erica [name] will be 42 this next week.  She has been treated for depression since she was a teenager.  Early on she learned skills for how to deal with her depression.  She came to [facility] in 2007 for graduate school.  In 2009 she sought treatment with Trey Paula.  Following Trey Paula' retirement, she wasn't ready to start treatment again until now.  Diagnosis: Major Depressive Disorder, Recurrent, Moderate   Individualized Treatment Plan       Strengths: intelligent, quick witted, funny, resourceful, people oriented, kind, adaptable  Supports: parents, 2 siblings, friends   Goal/Needs for Treatment:  In order of importance to patient 1) Learn and implement positive coping skills to decrease depression in order to feel less mentally exhausted. 2) Learn and implement positive coping skills to decrease anxiety in order to feel less mentally exhausted. 3) Process the trauma of the divorce and grieve the loss of the marriage    Client Statement of Needs: Pt states she needs "to be less mentally exhausted all the time and have energy to do things she wants to do."  Address the trauma of the divorce which she hasn't fully processed.  She wants to enjoy her life and worry less about all the things that aren't "right or haven't gone the way she expected."      Treatment Level:Weekly Outpatient Individual Psychotherapy  Symptoms: c/o that while depression is better managed, she has constant anxiety.  Hypervigilance, nervous, constantly worrying about everything, over thinking,    She has a "weird relationship with food."  She doesn't feel hunger when she should because her stomach holds a lot of tension.  Often she won't eat until she feels faint or nauseous.  Sleep if discontinuous, initial sleep is good.  She sweats at night (not perimenopausal), has nightmares.  Struggles with fatigue, motivation, trouble getting started.  Client Treatment Preferences: Referred to this therapist by her previous therapist who retired the Museum/gallery exhibitions officer consumer's goal for treatment:  Psychologist, Erica [name], Erica [name].,  will support the patient's ability to achieve the goals identified. Cognitive Behavioral Therapy, Dialectical Behavioral Therapy, Motivational Interviewing, Behavior Activation, parenting skills and other evidenced-based practices will be used to promote progress towards healthy functioning.   [name] will: Actively participate in therapy, working towards healthy functioning.    *Justification for Continuation/Discontinuation of Goal: R=Revised, O=Ongoing, A=Achieved, D=Discontinued  Goal 1) Learn and implement coping skills to decrease depression in order to feel less mentally exhausted  5 Point Likert rating baseline date: Target Date Goal Was reviewed Status Code Progress towards goal/Likert rating  03/22/2023           O              Goal 2) Learn and implement positive coping skills to decrease anxiety in order to feel less mentally exhausted  5 Point Likert rating baseline date: Target Date Goal Was reviewed Status Code Progress towards goal/Likert rating  03/22/2023            O  Goal 3) Process the trauma of the divorce and grieve the loss of the marriage  5 Point Likert rating baseline date: Target Date Goal Was reviewed Status Code Progress towards goal/Likert rating  03/22/2023          O               This plan has been reviewed and created by the following participants:  This plan will be  reviewed at least every 12 months. Date Behavioral Health Clinician Date Guardian/Patient   03/21/2022 Erica [name], Erica [name]  03/21/2022 Erica [name]                    Erica [name] reports that she continued to reflect on our d/ in our previous session about BPD.  She was able to relate to many of the symptoms listed in the resources I provided.  We were able to d/e/p her early experiences, her mother's mental health struggles and how these all tied into BPD.  I noted that we were d/ this as a possibility and that we would continue to e/  it potential for helping her better understand and have compassion for herself.   She states that she thinks the Erica [name] is already having a positive impact.  She's noticed that she is already feeling less agitated and is able to sit still and not be in constant motion.     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

## 2022-06-06 ENCOUNTER — Ambulatory Visit: Payer: Self-pay | Admitting: Psychology

## 2022-06-08 ENCOUNTER — Ambulatory Visit (INDEPENDENT_AMBULATORY_CARE_PROVIDER_SITE_OTHER): Payer: Commercial Managed Care - HMO | Admitting: Psychology

## 2022-06-08 DIAGNOSIS — F902 Attention-deficit hyperactivity disorder, combined type: Secondary | ICD-10-CM

## 2022-06-08 NOTE — Progress Notes (Signed)
PROGRESS NOTE  Name: Erica [name] Date: 06/08/2022 MRN: [id number] DOB: 08-01-1981 PCP: Binnie Rail, MD  Time spent: 4:00-4:55 PM  Today I met with  Erica [name] in remote video (WebEx) face-to-face individual psychotherapy.   Distance Site: Client's Home Orginating Site: Dr Jannifer Franklin Remote Office Consent: Obtained verbal consent to transmit  session remotely    Reason for Visit /Presenting Problem: Erica [name] will be 42 this next week.  She has been treated for depression since she was a teenager.  Early on she learned skills for how to deal with her depression.  She came to [facility] in 2007 for graduate school.  In 2009 she sought treatment with Trey Paula.  Following Trey Paula' retirement, she wasn't ready to start treatment again until now.  Diagnosis: Major Depressive Disorder, Recurrent, Moderate   Individualized Treatment Plan       Strengths: intelligent, quick witted, funny, resourceful, people oriented, kind, adaptable  Supports: parents, 2 siblings, friends   Goal/Needs for Treatment:  In order of importance to patient 1) Learn and implement positive coping skills to decrease depression in order to feel less mentally exhausted. 2) Learn and implement positive coping skills to decrease anxiety in order to feel less mentally exhausted. 3) Process the trauma of the divorce and grieve the loss of the marriage    Client Statement of Needs: Pt states she needs "to be less mentally exhausted all the time and have energy to do things she wants to do."  Address the trauma of the divorce which she hasn't fully processed.  She wants to enjoy her life and worry less about all the things that aren't "right or haven't gone the way she expected."      Treatment Level:Weekly Outpatient Individual Psychotherapy  Symptoms: c/o that while depression is better managed, she has constant anxiety.  Hypervigilance, nervous, constantly worrying about everything, over thinking,   She  has a "weird relationship with food."  She doesn't feel hunger when she should because her stomach holds a lot of tension.  Often she won't eat until she feels faint or nauseous.  Sleep if discontinuous, initial sleep is good.  She sweats at night (not perimenopausal), has nightmares.  Struggles with fatigue, motivation, trouble getting started.  Client Treatment Preferences: Referred to this therapist by her previous therapist who retired the Museum/gallery exhibitions officer consumer's goal for treatment:  Psychologist, Royetta Crochet, [name].,  will support the patient's ability to achieve the goals identified. Cognitive Behavioral Therapy, Dialectical Behavioral Therapy, Motivational Interviewing, Behavior Activation, parenting skills and other evidenced-based practices will be used to promote progress towards healthy functioning.   Healthcare consumer [name] will: Actively participate in therapy, working towards healthy functioning.    *Justification for Continuation/Discontinuation of Goal: R=Revised, O=Ongoing, A=Achieved, D=Discontinued  Goal 1) Learn and implement coping skills to decrease depression in order to feel less mentally exhausted  5 Point Likert rating baseline date: Target Date Goal Was reviewed Status Code Progress towards goal/Likert rating  03/22/2023           O              Goal 2) Learn and implement positive coping skills to decrease anxiety in order to feel less mentally exhausted  5 Point Likert rating baseline date: Target Date Goal Was reviewed Status Code Progress towards goal/Likert rating  03/22/2023            O  Goal 3) Process the trauma of the divorce and grieve the loss of the marriage  5 Point Likert rating baseline date: Target Date Goal Was reviewed Status Code Progress towards goal/Likert rating  03/22/2023          O               This plan has been reviewed and created by the following participants:  This plan will be reviewed at  least every 12 months. Date Behavioral Health Clinician Date Guardian/Patient   03/21/2022 Royetta Crochet, [name]  03/21/2022 Estill Bamberg Clover                    [name] reports that she the Christianne Borrow continues to have a positive impact.  She's noticed that her anxiety levels are lower, feels less tense and she isn't participating in less self destructive behaviors (ie., drinking or smoking to settle down for sleep).  I provided psycho education around the stress cycle, the role of cortisol and ways to decrease cortisol.  By the end of the session, she had a list of activities to add to her self care regime.  We also talked about how now that she is feeling less anxious, she "has room to feel other emotions."  We d/e/p her anger/bitter feelings about her divorced, the fear of being alone and working toward acceptance.     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

## 2022-06-13 ENCOUNTER — Ambulatory Visit (INDEPENDENT_AMBULATORY_CARE_PROVIDER_SITE_OTHER): Payer: Commercial Managed Care - HMO | Admitting: Psychology

## 2022-06-13 DIAGNOSIS — F902 Attention-deficit hyperactivity disorder, combined type: Secondary | ICD-10-CM | POA: Diagnosis not present

## 2022-06-13 DIAGNOSIS — F411 Generalized anxiety disorder: Secondary | ICD-10-CM | POA: Diagnosis not present

## 2022-06-13 NOTE — Progress Notes (Signed)
PROGRESS NOTE  Name: Erica [name] Date: 06/13/2022 MRN: [id number] DOB: [birth year] PCP: Erica Rail, MD  Time spent: 3:00-3:55 PM  Today I met with  Erica [name] in remote video (WebEx) face-to-face individual psychotherapy.   Distance Site: Client's Home Orginating Site: Dr Erica [name] Remote Office Consent: Obtained verbal consent to transmit  session remotely    Reason for Visit /Presenting Problem: Erica [name] will be 42 this next week.  She has been treated for depression since she was a teenager.  Early on she learned skills for how to deal with her depression.  She came to [facility] in 2007 for graduate school.  In 2009 she sought treatment with Erica [name].  Following Erica [name]' retirement, she wasn't ready to start treatment again until now.  Diagnosis: Major Depressive Disorder, Recurrent, Moderate   Individualized Treatment Plan       Strengths: intelligent, quick witted, funny, resourceful, people oriented, kind, adaptable  Supports: parents, 2 siblings, friends   Goal/Needs for Treatment:  In order of importance to patient 1) Learn and implement positive coping skills to decrease depression in order to feel less mentally exhausted. 2) Learn and implement positive coping skills to decrease anxiety in order to feel less mentally exhausted. 3) Process the trauma of the divorce and grieve the loss of the marriage    Client Statement of Needs: Pt states she needs "to be less mentally exhausted all the time and have energy to do things she wants to do."  Address the trauma of the divorce which she hasn't fully processed.  She wants to enjoy her life and worry less about all the things that aren't "right or haven't gone the way she expected."      Treatment Level:Weekly Outpatient Individual Psychotherapy  Symptoms: c/o that while depression is better managed, she has constant anxiety.  Hypervigilance, nervous, constantly worrying about everything, over thinking,   She  has a "weird relationship with food."  She doesn't feel hunger when she should because her stomach holds a lot of tension.  Often she won't eat until she feels faint or nauseous.  Sleep if discontinuous, initial sleep is good.  She sweats at night (not perimenopausal), has nightmares.  Struggles with fatigue, motivation, trouble getting started.  Client Treatment Preferences: Referred to this therapist by her previous therapist who retired the Museum/gallery exhibitions officer consumer's goal for treatment:  Psychologist, Erica [name], [name].,  will support the patient's ability to achieve the goals identified. Cognitive Behavioral Therapy, Dialectical Behavioral Therapy, Motivational Interviewing, Behavior Activation, parenting skills and other evidenced-based practices will be used to promote progress towards healthy functioning.   [name] will: Actively participate in therapy, working towards healthy functioning.    *Justification for Continuation/Discontinuation of Goal: R=Revised, O=Ongoing, A=Achieved, D=Discontinued  Goal 1) Learn and implement coping skills to decrease depression in order to feel less mentally exhausted  5 Point Likert rating baseline date: Target Date Goal Was reviewed Status Code Progress towards goal/Likert rating  03/22/2023           O              Goal 2) Learn and implement positive coping skills to decrease anxiety in order to feel less mentally exhausted  5 Point Likert rating baseline date: Target Date Goal Was reviewed Status Code Progress towards goal/Likert rating  03/22/2023            O  Goal 3) Process the trauma of the divorce and grieve the loss of the marriage  5 Point Likert rating baseline date: Target Date Goal Was reviewed Status Code Progress towards goal/Likert rating  03/22/2023          O               This plan has been reviewed and created by the following participants:  This plan will be reviewed at  least every 12 months. Date Behavioral Health Clinician Date Guardian/Patient   03/21/2022 Erica [name], [name]  03/21/2022 Erica [name]                    Erica [name] reports that she met with her psychiatrist and they agreed to continue on 80 mg of Strattera.  She states that her good friend and neighbor is having relationship issues.  She had her first panic attack in a year's time.  We d/e/p that her relationship with a man who is similar to her ex is what is triggering her anxiety.  Erica [name] was over identifying with her friend and her boundaries were getting blurry.  We d/e/p the current dynamics and how to step back from the situation.  We d/ ways to better regulate her and exercise radical acceptance.      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

## 2022-06-14 ENCOUNTER — Other Ambulatory Visit: Payer: Self-pay | Admitting: Internal Medicine

## 2022-06-16 NOTE — Progress Notes (Signed)
[place] Phone: [phone] Subjective:   Erica [name], am serving as a scribe for Dr. Hulan Saas.  I'm seeing this patient by the request  of:  Binnie Rail, MD  CC: back and hip pain   PYP:[id number]  Erica [name] is a 41 y.o. female coming in with complaint of back and hip pain. OMT on 02/15/2022. Patient states that her pain is less. ROM has improved. Notes some acute pain from time to time. When she does have a flare she will have pain in the hip for entire day. ADHD medication is helping with muscle tightness.   Medications patient has been prescribed: None  Taking:         Reviewed prior external information including notes and imaging from previsou exam, outside providers and external EMR if available.   As well as notes that were available from care everywhere and other healthcare systems.  Past medical history, social, surgical and family history all reviewed in electronic medical record.  [name] pertanent information unless stated regarding to the chief complaint.   Past Medical History:  Diagnosis Date   Allergy    Anxiety    Depression    STD (sexually transmitted disease)    chlamydia treated 2023    [name] Known Allergies   Review of Systems:  [name] headache, visual changes, nausea, vomiting, diarrhea, constipation, dizziness, abdominal pain, skin rash, fevers, chills, night sweats, weight loss, swollen lymph nodes, body aches, joint swelling, chest pain, shortness of breath, mood changes. POSITIVE muscle aches  Objective  Blood pressure 118/88, pulse (!) 56, height 5\' 7"  (1.702 m), weight 209 lb (94.8 kg), SpO2 99 %.   General: [name] apparent distress alert and oriented x3 mood and affect normal, dressed appropriately.  HEENT: Pupils equal, extraocular movements intact  Respiratory: Patient's speak in full sentences and does not appear short of breath  Cardiovascular: [name] lower  extremity edema, non tender, [name] erythema  Gait normal  MSK:  Back continues to have tightness of the hip.  Seems to be left greater than right.  Patient does have some positive impingement noted.  Osteopathic findings  C3 flexed rotated and side bent left  C7 flexed rotated and side bent left T3 extended rotated and side bent right inhaled rib T9 extended rotated and side bent left L2 flexed rotated and side bent right Sacrum left on left  Pelvic shear noted.      Assessment and Plan:  Left hip pain Patient's left hip is doing relatively well at the moment.  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  Discussed which activities to do and which ones to avoid.  Increase activity slowly otherwise.  Follow-up again in 6 to 8 weeks.    Nonallopathic problems  Decision today to treat with OMT was based on Physical Exam  After verbal consent patient was treated with HVLA, ME, FPR techniques in cervical, rib, thoracic, lumbar, and sacral and pelvis  areas  Patient tolerated the procedure well with improvement in symptoms  Patient given exercises, stretches and lifestyle modifications  See medications in patient instructions if given  Patient will follow up in 4-8 weeks    The above documentation has been reviewed and is accurate and complete Erica Pulley, DO          Note: This dictation was prepared with Dragon dictation along with smaller phrase technology. Any transcriptional errors that result from this process are  unintentional.

## 2022-06-17 ENCOUNTER — Encounter: Payer: Self-pay | Admitting: Family Medicine

## 2022-06-17 ENCOUNTER — Ambulatory Visit: Payer: Commercial Managed Care - HMO | Admitting: Family Medicine

## 2022-06-17 VITALS — BP 118/88 | HR 56 | Ht 67.0 in | Wt 209.0 lb

## 2022-06-17 DIAGNOSIS — M9905 Segmental and somatic dysfunction of pelvic region: Secondary | ICD-10-CM | POA: Diagnosis not present

## 2022-06-17 DIAGNOSIS — M25552 Pain in left hip: Secondary | ICD-10-CM | POA: Diagnosis not present

## 2022-06-17 DIAGNOSIS — M9908 Segmental and somatic dysfunction of rib cage: Secondary | ICD-10-CM | POA: Diagnosis not present

## 2022-06-17 DIAGNOSIS — M9901 Segmental and somatic dysfunction of cervical region: Secondary | ICD-10-CM | POA: Diagnosis not present

## 2022-06-17 DIAGNOSIS — M9904 Segmental and somatic dysfunction of sacral region: Secondary | ICD-10-CM | POA: Diagnosis not present

## 2022-06-17 DIAGNOSIS — M9902 Segmental and somatic dysfunction of thoracic region: Secondary | ICD-10-CM

## 2022-06-17 DIAGNOSIS — M9903 Segmental and somatic dysfunction of lumbar region: Secondary | ICD-10-CM

## 2022-06-17 NOTE — Patient Instructions (Addendum)
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 Send message Monday to tell if that helped Otherwise doing great

## 2022-06-17 NOTE — Assessment & Plan Note (Signed)
Patient's left hip is doing relatively well at the moment.  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  Discussed which activities to do and which ones to avoid.  Increase activity slowly otherwise.  Follow-up again in 6 to 8 weeks.

## 2022-06-20 ENCOUNTER — Ambulatory Visit (INDEPENDENT_AMBULATORY_CARE_PROVIDER_SITE_OTHER): Payer: Commercial Managed Care - HMO | Admitting: Psychology

## 2022-06-20 ENCOUNTER — Encounter: Payer: Self-pay | Admitting: Family Medicine

## 2022-06-20 DIAGNOSIS — F411 Generalized anxiety disorder: Secondary | ICD-10-CM

## 2022-06-20 NOTE — Progress Notes (Signed)
PROGRESS NOTE  Name: Erica [name] Date: 06/20/2022 MRN: [id number] DOB: 1981/06/22 PCP: Binnie Rail, MD  Time spent: 3:00-3:55 PM  Today I met with  Erica [name] in remote video (WebEx) face-to-face individual psychotherapy.   Distance Site: Client's Home Orginating Site: Dr Jannifer Franklin Remote Office Consent: Obtained verbal consent to transmit  session remotely    Reason for Visit /Presenting Problem: Erica [name] will be 42 this next week.  She has been treated for depression since she was a teenager.  Early on she learned skills for how to deal with her depression.  She came to [facility] in 2007 for graduate school.  In 2009 she sought treatment with Trey Paula.  Following Trey Paula' retirement, she wasn't ready to start treatment again until now.  Diagnosis: Major Depressive Disorder, Recurrent, Moderate   Individualized Treatment Plan       Strengths: intelligent, quick witted, funny, resourceful, people oriented, kind, adaptable  Supports: parents, 2 siblings, friends   Goal/Needs for Treatment:  In order of importance to patient 1) Learn and implement positive coping skills to decrease depression in order to feel less mentally exhausted. 2) Learn and implement positive coping skills to decrease anxiety in order to feel less mentally exhausted. 3) Process the trauma of the divorce and grieve the loss of the marriage    Client Statement of Needs: Pt states she needs "to be less mentally exhausted all the time and have energy to do things she wants to do."  Address the trauma of the divorce which she hasn't fully processed.  She wants to enjoy her life and worry less about all the things that aren't "right or haven't gone the way she expected."      Treatment Level:Weekly Outpatient Individual Psychotherapy  Symptoms: c/o that while depression is better managed, she has constant anxiety.  Hypervigilance, nervous, constantly worrying about everything, over thinking,   She  has a "weird relationship with food."  She doesn't feel hunger when she should because her stomach holds a lot of tension.  Often she won't eat until she feels faint or nauseous.  Sleep if discontinuous, initial sleep is good.  She sweats at night (not perimenopausal), has nightmares.  Struggles with fatigue, motivation, trouble getting started.  Client Treatment Preferences: Referred to this therapist by her previous therapist who retired the Museum/gallery exhibitions officer consumer's goal for treatment:  Psychologist, Royetta Crochet, [name].,  will support the patient's ability to achieve the goals identified. Cognitive Behavioral Therapy, Dialectical Behavioral Therapy, Motivational Interviewing, Behavior Activation, parenting skills and other evidenced-based practices will be used to promote progress towards healthy functioning.   [name] will: Actively participate in therapy, working towards healthy functioning.    *Justification for Continuation/Discontinuation of Goal: R=Revised, O=Ongoing, A=Achieved, D=Discontinued  Goal 1) Learn and implement coping skills to decrease depression in order to feel less mentally exhausted  5 Point Likert rating baseline date: Target Date Goal Was reviewed Status Code Progress towards goal/Likert rating  03/22/2023           O              Goal 2) Learn and implement positive coping skills to decrease anxiety in order to feel less mentally exhausted  5 Point Likert rating baseline date: Target Date Goal Was reviewed Status Code Progress towards goal/Likert rating  03/22/2023            O  Goal 3) Process the trauma of the divorce and grieve the loss of the marriage  5 Point Likert rating baseline date: Target Date Goal Was reviewed Status Code Progress towards goal/Likert rating  03/22/2023          O               This plan has been reviewed and created by the following participants:  This plan will be reviewed at  least every 12 months. Date Behavioral Health Clinician Date Guardian/Patient   03/21/2022 Royetta Crochet, [name]  03/21/2022 Estill [place]                    [name] reports that she has stepped back from her friend but is still feeling hurt.  She spent some time e/p her anger and how it related to her own past relationships.  [name] shared the difficult stories leading up to her decision to divorce.  We d/e the topics of honesty and trust in relationships.     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

## 2022-06-27 ENCOUNTER — Ambulatory Visit (INDEPENDENT_AMBULATORY_CARE_PROVIDER_SITE_OTHER): Payer: Commercial Managed Care - HMO | Admitting: Psychology

## 2022-06-27 DIAGNOSIS — F411 Generalized anxiety disorder: Secondary | ICD-10-CM

## 2022-06-27 NOTE — Progress Notes (Signed)
PROGRESS NOTE  Name: Erica [name] Date: 06/27/2022 MRN: [id number] DOB: 1980-12-29 PCP: Binnie Rail, MD  Time spent: 3:00-3:55 PM  Today I met with  Erica [name] in remote video (WebEx) face-to-face individual psychotherapy.   Distance Site: Client's Home Orginating Site: Dr Jannifer Franklin Remote Office Consent: Obtained verbal consent to transmit  session remotely    Reason for Visit /Presenting Problem: Erica [name] will be 42 this next week.  She has been treated for depression since she was a teenager.  Early on she learned skills for how to deal with her depression.  She came to [facility] in 2007 for graduate school.  In 2009 she sought treatment with Trey Paula.  Following Trey Paula' retirement, she wasn't ready to start treatment again until now.  Diagnosis: Major Depressive Disorder, Recurrent, Moderate   Individualized Treatment Plan       Strengths: intelligent, quick witted, funny, resourceful, people oriented, kind, adaptable  Supports: parents, 2 siblings, friends   Goal/Needs for Treatment:  In order of importance to patient 1) Learn and implement positive coping skills to decrease depression in order to feel less mentally exhausted. 2) Learn and implement positive coping skills to decrease anxiety in order to feel less mentally exhausted. 3) Process the trauma of the divorce and grieve the loss of the marriage    Client Statement of Needs: Pt states she needs "to be less mentally exhausted all the time and have energy to do things she wants to do."  Address the trauma of the divorce which she hasn't fully processed.  She wants to enjoy her life and worry less about all the things that aren't "right or haven't gone the way she expected."      Treatment Level:Weekly Outpatient Individual Psychotherapy  Symptoms: c/o that while depression is better managed, she has constant anxiety.  Hypervigilance, nervous, constantly worrying about everything, over thinking,   She  has a "weird relationship with food."  She doesn't feel hunger when she should because her stomach holds a lot of tension.  Often she won't eat until she feels faint or nauseous.  Sleep if discontinuous, initial sleep is good.  She sweats at night (not perimenopausal), has nightmares.  Struggles with fatigue, motivation, trouble getting started.  Client Treatment Preferences: Referred to this therapist by her previous therapist who retired the Museum/gallery exhibitions officer consumer's goal for treatment:  Psychologist, Royetta Crochet, [name].,  will support the patient's ability to achieve the goals identified. Cognitive Behavioral Therapy, Dialectical Behavioral Therapy, Motivational Interviewing, Behavior Activation, parenting skills and other evidenced-based practices will be used to promote progress towards healthy functioning.   [name] will: Actively participate in therapy, working towards healthy functioning.    *Justification for Continuation/Discontinuation of Goal: R=Revised, O=Ongoing, A=Achieved, D=Discontinued  Goal 1) Learn and implement coping skills to decrease depression in order to feel less mentally exhausted  5 Point Likert rating baseline date: Target Date Goal Was reviewed Status Code Progress towards goal/Likert rating  03/22/2023           O              Goal 2) Learn and implement positive coping skills to decrease anxiety in order to feel less mentally exhausted  5 Point Likert rating baseline date: Target Date Goal Was reviewed Status Code Progress towards goal/Likert rating  03/22/2023            O  Goal 3) Process the trauma of the divorce and grieve the loss of the marriage  5 Point Likert rating baseline date: Target Date Goal Was reviewed Status Code Progress towards goal/Likert rating  03/22/2023          O               This plan has been reviewed and created by the following participants:  This plan will be reviewed at  least every 12 months. Date Behavioral Health Clinician Date Guardian/Patient   03/21/2022 Royetta Crochet, [name]  03/21/2022 Erica [name]                    Erica [name] reports that her daughter called her very upset while she was at her father's house.  We d/p what it's like for her to deal with her daughter when she is dealing with things that can't be changed.  I encouraged her to share/teach Erica [name] distress tolerance and emotion regulation skills.  I provided her with some resources (workbooks) to assist her in teaching her daughter these skills.    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  We d/e/p past history, current difficulties and how to regulate her emotions.     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

## 2022-07-04 ENCOUNTER — Ambulatory Visit (INDEPENDENT_AMBULATORY_CARE_PROVIDER_SITE_OTHER): Payer: Commercial Managed Care - HMO | Admitting: Psychology

## 2022-07-04 DIAGNOSIS — F411 Generalized anxiety disorder: Secondary | ICD-10-CM | POA: Diagnosis not present

## 2022-07-04 NOTE — Progress Notes (Signed)
PROGRESS NOTE  Name: Erica [name] Date: 07/04/2022 MRN: [id number] DOB: 30-May-1981 PCP: Binnie Rail, MD  Time spent: 3:00-3:55 PM  Today I met with  Erica [name] in remote video (WebEx) face-to-face individual psychotherapy.   Distance Site: Client's Home Orginating Site: Dr Jannifer Franklin Remote Office Consent: Obtained verbal consent to transmit  session remotely    Reason for Visit /Presenting Problem: Erica [name] will be 42 this next week.  She has been treated for depression since she was a teenager.  Early on she learned skills for how to deal with her depression.  She came to [facility] in 2007 for graduate school.  In 2009 she sought treatment with Trey Paula.  Following Trey Paula' retirement, she wasn't ready to start treatment again until now.  Diagnosis: Major Depressive Disorder, Recurrent, Moderate   Individualized Treatment Plan                Strengths: intelligent, quick witted, funny, resourceful, people oriented, kind, adaptable  Supports: parents, 2 siblings, friends   Goal/Needs for Treatment:  In order of importance to patient 1) Learn and implement positive coping skills to decrease depression in order to feel less mentally exhausted. 2) Learn and implement positive coping skills to decrease anxiety in order to feel less mentally exhausted. 3) Process the trauma of the divorce and grieve the loss of the marriage    Client Statement of Needs: Pt states she needs "to be less mentally exhausted all the time and have energy to do things she wants to do."  Address the trauma of the divorce which she hasn't fully processed.  She wants to enjoy her life and worry less about all the things that aren't "right or haven't gone the way she expected."      Treatment Level:Weekly Outpatient Individual Psychotherapy  Symptoms: c/o that while depression is better managed, she has constant anxiety.  Hypervigilance, nervous, constantly worrying about everything, over  thinking,   She has a "weird relationship with food."  She doesn't feel hunger when she should because her stomach holds a lot of tension.  Often she won't eat until she feels faint or nauseous.  Sleep if discontinuous, initial sleep is good.  She sweats at night (not perimenopausal), has nightmares.  Struggles with fatigue, motivation, trouble getting started.  Client Treatment Preferences: Referred to this therapist by her previous therapist who retired the Museum/gallery exhibitions officer consumer's goal for treatment:  Psychologist, Erica [name], Erica [name].,  will support the patient's ability to achieve the goals identified. Cognitive Behavioral Therapy, Dialectical Behavioral Therapy, Motivational Interviewing, Behavior Activation, parenting skills and other evidenced-based practices will be used to promote progress towards healthy functioning.   [name] will: Actively participate in therapy, working towards healthy functioning.    *Justification for Continuation/Discontinuation of Goal: R=Revised, O=Ongoing, A=Achieved, D=Discontinued  Goal 1) Learn and implement coping skills to decrease depression in order to feel less mentally exhausted  5 Point Likert rating baseline date: Target Date Goal Was reviewed Status Code Progress towards goal/Likert rating  03/22/2023           O              Goal 2) Learn and implement positive coping skills to decrease anxiety in order to feel less mentally exhausted  5 Point Likert rating baseline date: Target Date Goal Was reviewed Status Code Progress towards goal/Likert rating  03/22/2023  O              Goal 3) Process the trauma of the divorce and grieve the loss of the marriage  5 Point Likert rating baseline date: Target Date Goal Was reviewed Status Code Progress towards goal/Likert rating  03/22/2023          O               This plan has been reviewed and created by the following participants:  This plan will  be reviewed at least every 12 months. Date Behavioral Health Clinician Date Guardian/Patient   03/21/2022 Erica [name], Erica [name]  03/21/2022 Erica [name]                    Erica [name] reports that her parents stayed the weekend and they managed to have a good time.  She was able to have a "deep" talk with her father about his behavior when he was still drinking.  Her father came down in part to make amends ([employer] [place]).  We d/e/p that it didn't go well, he was defensive, interrupted and invalidating.  I encouraged her to create a journal in which she writes all the things she needs to tell him and to leave space for him to respond.       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

## 2022-07-07 ENCOUNTER — Other Ambulatory Visit: Payer: Self-pay | Admitting: Internal Medicine

## 2022-07-09 ENCOUNTER — Other Ambulatory Visit: Payer: Self-pay | Admitting: Internal Medicine

## 2022-07-11 ENCOUNTER — Ambulatory Visit: Payer: 59 | Admitting: Psychology

## 2022-07-16 IMAGING — MG MM DIGITAL SCREENING BILAT W/ TOMO AND CAD
8 series · 8 of 24 positions shown · non-contrast
Comparison: None.

CLINICAL DATA: Screening.

EXAM:
DIGITAL SCREENING BILATERAL MAMMOGRAM WITH TOMOSYNTHESIS AND CAD
TECHNIQUE: Bilateral screening digital craniocaudal and mediolateral oblique
mammograms were obtained. Bilateral screening digital breast
tomosynthesis was performed. The images were evaluated with
computer-aided detection.

[R CC synth-2D]
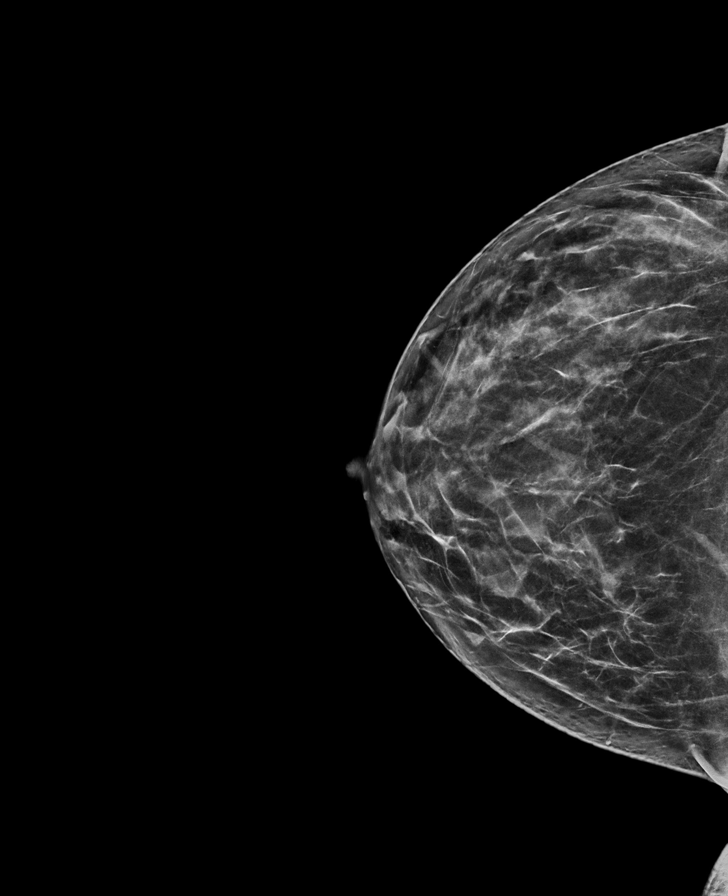

[R MLO synth-2D]
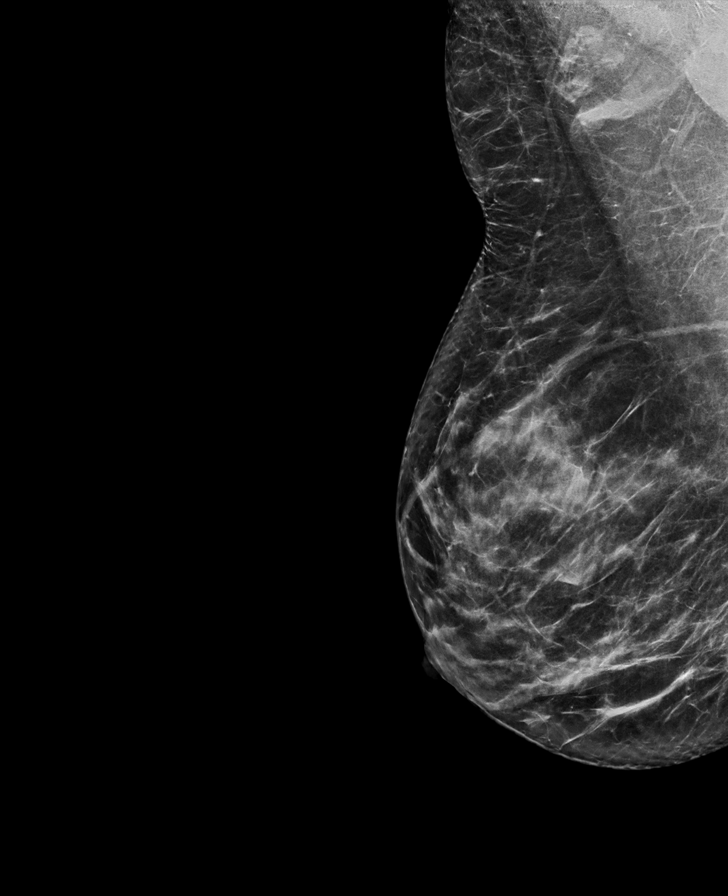

[L CC synth-2D]
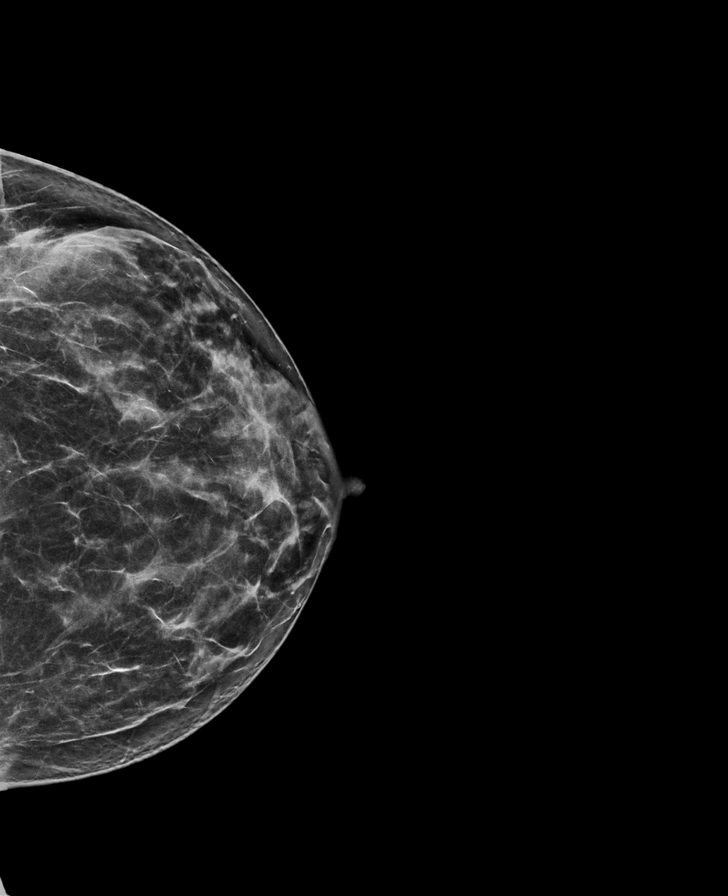

[L MLO synth-2D]
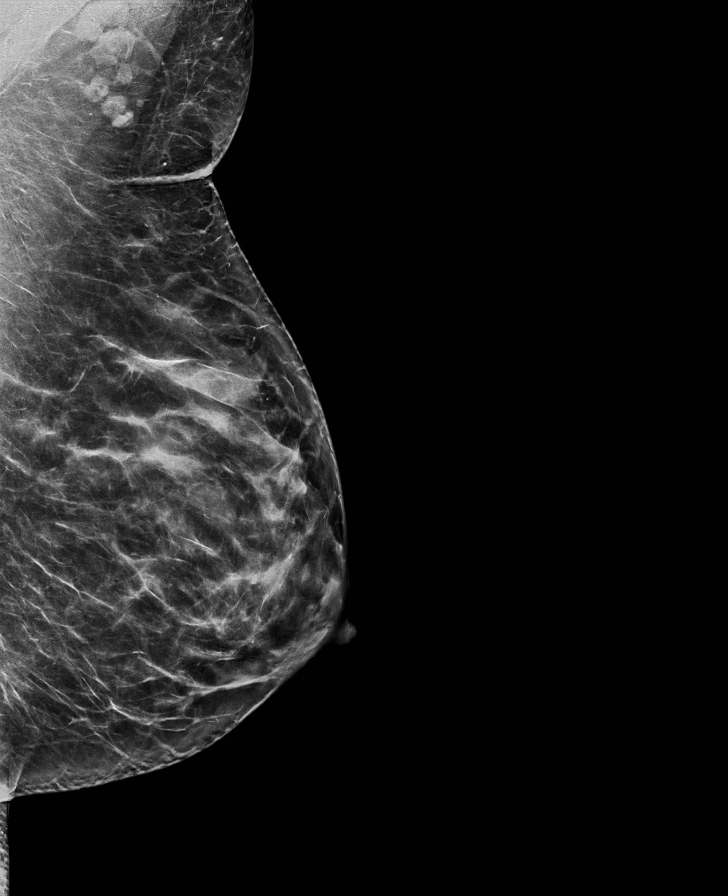

[L CC tomo · tomo slice 35/69.0]
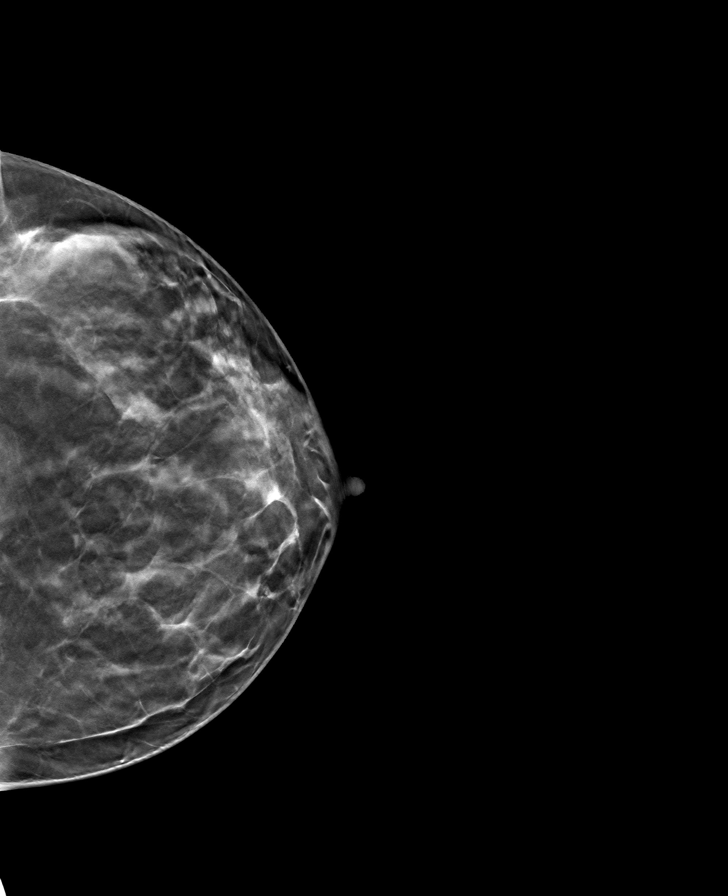

[R MLO tomo · tomo slice 39/76.0]
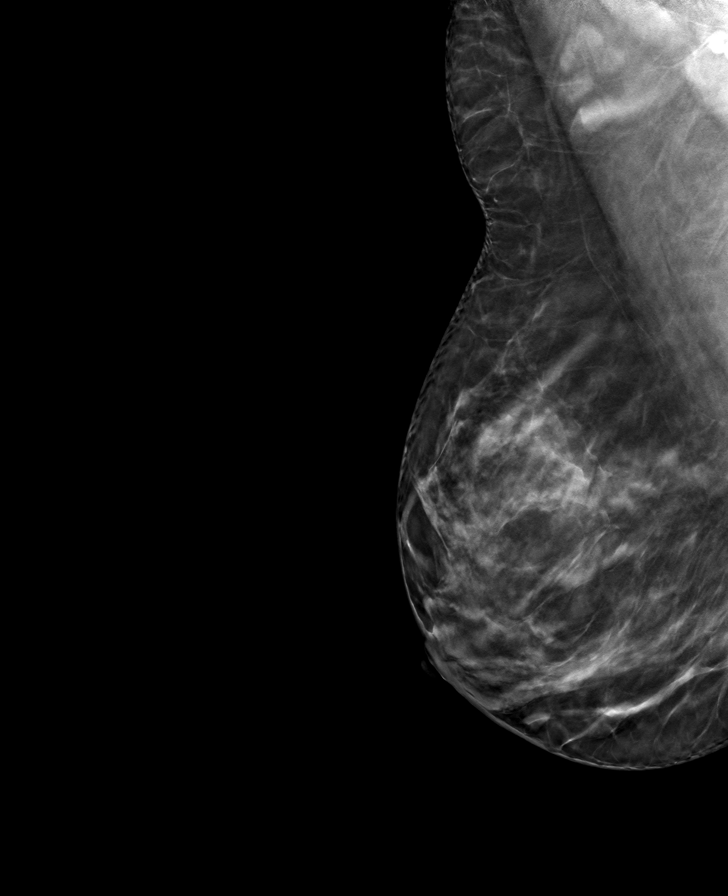

[L MLO tomo · tomo slice 38/75.0]
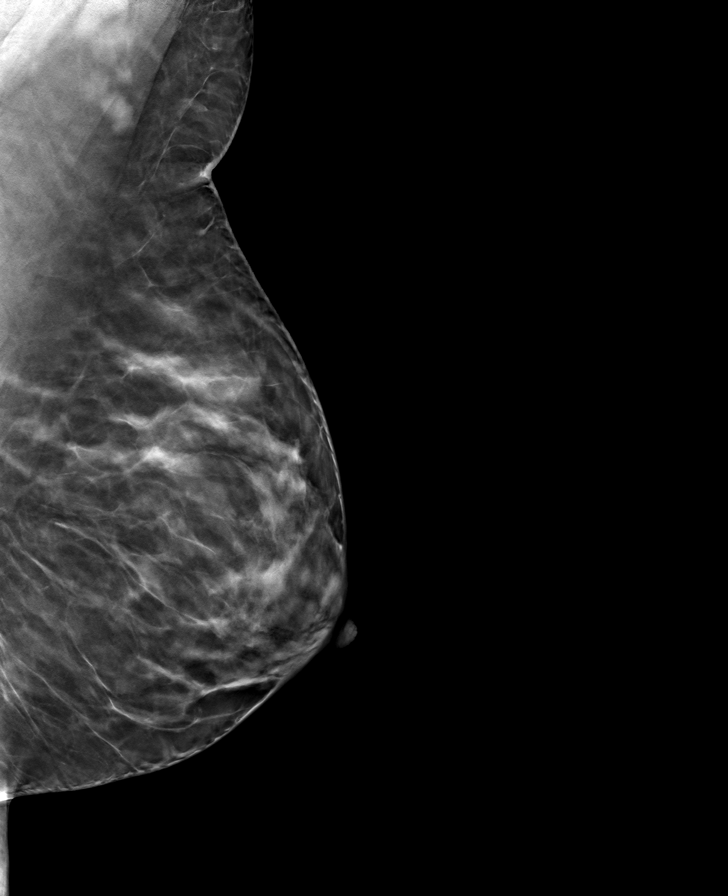

[R CC tomo · tomo slice 34/67.0]
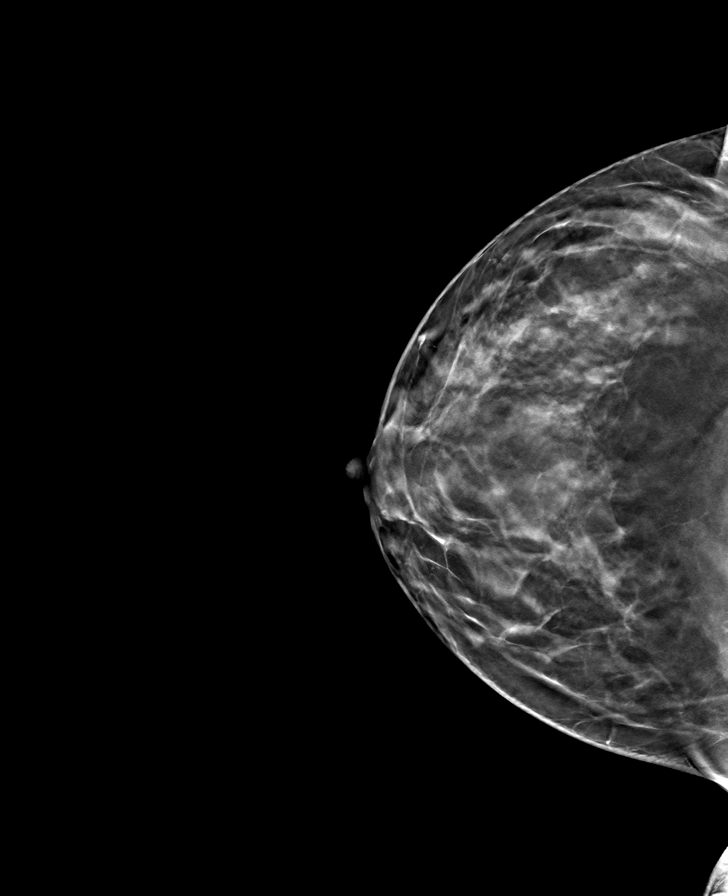

[8 of 24 positions shown; findings below may reference images not displayed]

ACR Breast Density Category b: There are scattered areas of
fibroglandular density.
FINDINGS: There are no findings suspicious for malignancy.
IMPRESSION: No mammographic evidence of malignancy. A result letter of this
screening mammogram will be mailed directly to the patient.

RECOMMENDATION:
Screening mammogram in one year. (Code:XG-X-X7B)

BI-RADS CATEGORY  1: Negative.

## 2022-07-18 ENCOUNTER — Ambulatory Visit (INDEPENDENT_AMBULATORY_CARE_PROVIDER_SITE_OTHER): Payer: Commercial Managed Care - HMO | Admitting: Psychology

## 2022-07-18 DIAGNOSIS — F411 Generalized anxiety disorder: Secondary | ICD-10-CM

## 2022-07-18 NOTE — Progress Notes (Signed)
PROGRESS NOTE  Name: Erica [name] Date: 07/18/2022 MRN: [id number] DOB: [birth year] PCP: Binnie Rail, MD  Time spent: 3:00-3:55 PM  Today I met with  Erica [name] in remote video (WebEx) face-to-face individual psychotherapy.   Distance Site: Client's Home Orginating Site: Dr Jannifer Franklin Remote Office Consent: Obtained verbal consent to transmit  session remotely    Reason for Visit /Presenting Problem: Erica [name] will be 42 this next week.  She has been treated for depression since she was a teenager.  Early on she learned skills for how to deal with her depression.  She came to [facility] in 2007 for graduate school.  In 2009 she sought treatment with Erica [name].  Following Erica [name]' retirement, she wasn't ready to start treatment again until now.  Diagnosis: Major Depressive Disorder, Recurrent, Moderate   Individualized Treatment Plan                Strengths: intelligent, quick witted, funny, resourceful, people oriented, kind, adaptable  Supports: parents, 2 siblings, friends   Goal/Needs for Treatment:  In order of importance to patient 1) Learn and implement positive coping skills to decrease depression in order to feel less mentally exhausted. 2) Learn and implement positive coping skills to decrease anxiety in order to feel less mentally exhausted. 3) Process the trauma of the divorce and grieve the loss of the marriage    Client Statement of Needs: Pt states she needs "to be less mentally exhausted all the time and have energy to do things she wants to do."  Address the trauma of the divorce which she hasn't fully processed.  She wants to enjoy her life and worry less about all the things that aren't "right or haven't gone the way she expected."      Treatment Level:Weekly Outpatient Individual Psychotherapy  Symptoms: c/o that while depression is better managed, she has constant anxiety.  Hypervigilance, nervous, constantly worrying about everything, over  thinking,   She has a "weird relationship with food."  She doesn't feel hunger when she should because her stomach holds a lot of tension.  Often she won't eat until she feels faint or nauseous.  Sleep if discontinuous, initial sleep is good.  She sweats at night (not perimenopausal), has nightmares.  Struggles with fatigue, motivation, trouble getting started.  Client Treatment Preferences: Referred to this therapist by her previous therapist who retired the Museum/gallery exhibitions officer consumer's goal for treatment:  Psychologist, Erica [name], [name].,  will support the patient's ability to achieve the goals identified. Cognitive Behavioral Therapy, Dialectical Behavioral Therapy, Motivational Interviewing, Behavior Activation, parenting skills and other evidenced-based practices will be used to promote progress towards healthy functioning.   [name] will: Actively participate in therapy, working towards healthy functioning.    *Justification for Continuation/Discontinuation of Goal: R=Revised, O=Ongoing, A=Achieved, D=Discontinued  Goal 1) Learn and implement coping skills to decrease depression in order to feel less mentally exhausted  5 Point Likert rating baseline date: Target Date Goal Was reviewed Status Code Progress towards goal/Likert rating  03/22/2023           O              Goal 2) Learn and implement positive coping skills to decrease anxiety in order to feel less mentally exhausted  5 Point Likert rating baseline date: Target Date Goal Was reviewed Status Code Progress towards goal/Likert rating  03/22/2023            O  Goal 3) Process the trauma of the divorce and grieve the loss of the marriage  5 Point Likert rating baseline date: Target Date Goal Was reviewed Status Code Progress towards goal/Likert rating  03/22/2023          O               This plan has been reviewed and created by the following participants:  This plan will  be reviewed at least every 12 months. Date Behavioral Health Clinician Date Guardian/Patient   03/21/2022 Erica [name], [name]  03/21/2022 Erica [name]                    Erica [name] reports that while she has been feeling emotionally tired, she isn't emotionally exhausted.  She shared a situation where she felt like she was being triangulated between two friends.  This was one of several situations she has been contending with.  Erica [name] and I d/e/p some of the traps she falls into in her romantic relationships (ie., over sharing too soon, merging, etc.).  Lastly, we returned to our work around her relationship with her father and difficulties she is having in getting him to acknowledge the harm that he's done as he works through his [employer] ([place]).    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

## 2022-07-25 ENCOUNTER — Ambulatory Visit (INDEPENDENT_AMBULATORY_CARE_PROVIDER_SITE_OTHER): Payer: Commercial Managed Care - HMO | Admitting: Psychology

## 2022-07-25 DIAGNOSIS — F331 Major depressive disorder, recurrent, moderate: Secondary | ICD-10-CM

## 2022-07-25 DIAGNOSIS — F411 Generalized anxiety disorder: Secondary | ICD-10-CM

## 2022-07-25 NOTE — Progress Notes (Signed)
PROGRESS NOTE  Name: Erica [name] Date: 07/25/2022 MRN: [id number] DOB: [birth year] PCP: Binnie Rail, MD  Time spent: 3:00-3:55 PM  Today I met with  Erica [name] in remote video (WebEx) face-to-face individual psychotherapy.   Distance Site: Client's Home Orginating Site: Dr Jannifer Franklin Remote Office Consent: Obtained verbal consent to transmit  session remotely    Reason for Visit /Presenting Problem: Erica [name] will be 42 this next week.  She has been treated for depression since she was a teenager.  Early on she learned skills for how to deal with her depression.  She came to [facility] in 2007 for graduate school.  In 2009 she sought treatment with Trey Paula.  Following Trey Paula' retirement, she wasn't ready to start treatment again until now.  Diagnosis: Major Depressive Disorder, Recurrent, Moderate   Individualized Treatment Plan                Strengths: intelligent, quick witted, funny, resourceful, people oriented, kind, adaptable  Supports: parents, 2 siblings, friends   Goal/Needs for Treatment:  In order of importance to patient 1) Learn and implement positive coping skills to decrease depression in order to feel less mentally exhausted. 2) Learn and implement positive coping skills to decrease anxiety in order to feel less mentally exhausted. 3) Process the trauma of the divorce and grieve the loss of the marriage    Client Statement of Needs: Pt states she needs "to be less mentally exhausted all the time and have energy to do things she wants to do."  Address the trauma of the divorce which she hasn't fully processed.  She wants to enjoy her life and worry less about all the things that aren't "right or haven't gone the way she expected."      Treatment Level:Weekly Outpatient Individual Psychotherapy  Symptoms: c/o that while depression is better managed, she has constant anxiety.  Hypervigilance, nervous, constantly worrying about everything, over  thinking,   She has a "weird relationship with food."  She doesn't feel hunger when she should because her stomach holds a lot of tension.  Often she won't eat until she feels faint or nauseous.  Sleep if discontinuous, initial sleep is good.  She sweats at night (not perimenopausal), has nightmares.  Struggles with fatigue, motivation, trouble getting started.  Client Treatment Preferences: Referred to this therapist by her previous therapist who retired the Museum/gallery exhibitions officer consumer's goal for treatment:  Psychologist, Royetta Crochet, [name].,  will support the patient's ability to achieve the goals identified. Cognitive Behavioral Therapy, Dialectical Behavioral Therapy, Motivational Interviewing, Behavior Activation, parenting skills and other evidenced-based practices will be used to promote progress towards healthy functioning.   Healthcare consumer Junella Onofrio will: Actively participate in therapy, working towards healthy functioning.    *Justification for Continuation/Discontinuation of Goal: R=Revised, O=Ongoing, A=Achieved, D=Discontinued  Goal 1) Learn and implement coping skills to decrease depression in order to feel less mentally exhausted  5 Point Likert rating baseline date: Target Date Goal Was reviewed Status Code Progress towards goal/Likert rating  03/22/2023           O              Goal 2) Learn and implement positive coping skills to decrease anxiety in order to feel less mentally exhausted  5 Point Likert rating baseline date: Target Date Goal Was reviewed Status Code Progress towards goal/Likert rating  03/22/2023            O  Goal 3) Process the trauma of the divorce and grieve the loss of the marriage  5 Point Likert rating baseline date: Target Date Goal Was reviewed Status Code Progress towards goal/Likert rating  03/22/2023          O               This plan has been reviewed and created by the following participants:  This plan will  be reviewed at least every 12 months. Date Behavioral Health Clinician Date Guardian/Patient   03/21/2022 Royetta Crochet, [name]  03/21/2022 Estill Bamberg Kichline                    [name] reports that she had a meeting with her boss and a co-worker which proved to be overly conflictual.  We d/e/p what occurred, where things got off track and created an action plan.  I also helped [name] assess her work relationships, where she struggles because of her BPD and how to re-categorize these relationships based on their actions.  I gave her an assignment to create a sociogram on which she will place all of her relationships.  I gave her instructions and a key to use to represent each relationship.      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

## 2022-08-01 ENCOUNTER — Ambulatory Visit (INDEPENDENT_AMBULATORY_CARE_PROVIDER_SITE_OTHER): Payer: Commercial Managed Care - HMO | Admitting: Psychology

## 2022-08-01 DIAGNOSIS — F411 Generalized anxiety disorder: Secondary | ICD-10-CM | POA: Diagnosis not present

## 2022-08-01 NOTE — Progress Notes (Signed)
PROGRESS NOTE  Name: Anjolina Byrer Micco Date: 08/01/2022 MRN: [id number] DOB: 1981/03/08 PCP: Binnie Rail, MD  Time spent: 3:00-3:55 PM  Today I met with  Uvaldo Bristle Bibb in remote video (WebEx) face-to-face individual psychotherapy.   Distance Site: Client's Home Orginating Site: Dr Jannifer Franklin Remote Office Consent: Obtained verbal consent to transmit  session remotely    Reason for Visit /Presenting Problem: Daanya Banh will be 42 this next week.  She has been treated for depression since she was a teenager.  Early on she learned skills for how to deal with her depression.  She came to [facility] in 2007 for graduate school.  In 2009 she sought treatment with Trey Paula.  Following Trey Paula' retirement, she wasn't ready to start treatment again until now.  Diagnosis: Major Depressive Disorder, Recurrent, Moderate   Individualized Treatment Plan                Strengths: intelligent, quick witted, funny, resourceful, people oriented, kind, adaptable  Supports: parents, 2 siblings, friends   Goal/Needs for Treatment:  In order of importance to patient 1) Learn and implement positive coping skills to decrease depression in order to feel less mentally exhausted. 2) Learn and implement positive coping skills to decrease anxiety in order to feel less mentally exhausted. 3) Process the trauma of the divorce and grieve the loss of the marriage    Client Statement of Needs: Pt states she needs "to be less mentally exhausted all the time and have energy to do things she wants to do."  Address the trauma of the divorce which she hasn't fully processed.  She wants to enjoy her life and worry less about all the things that aren't "right or haven't gone the way she expected."      Treatment Level:Weekly Outpatient Individual Psychotherapy  Symptoms: c/o that while depression is better managed, she has constant anxiety.  Hypervigilance, nervous, constantly worrying about everything, over thinking,    She has a "weird relationship with food."  She doesn't feel hunger when she should because her stomach holds a lot of tension.  Often she won't eat until she feels faint or nauseous.  Sleep if discontinuous, initial sleep is good.  She sweats at night (not perimenopausal), has nightmares.  Struggles with fatigue, motivation, trouble getting started.  Client Treatment Preferences: Referred to this therapist by her previous therapist who retired the Museum/gallery exhibitions officer consumer's goal for treatment:  Psychologist, Royetta Crochet, [name].,  will support the patient's ability to achieve the goals identified. Cognitive Behavioral Therapy, Dialectical Behavioral Therapy, Motivational Interviewing, Behavior Activation, parenting skills and other evidenced-based practices will be used to promote progress towards healthy functioning.   Healthcare consumer Sherline Agosto will: Actively participate in therapy, working towards healthy functioning.    *Justification for Continuation/Discontinuation of Goal: R=Revised, O=Ongoing, A=Achieved, D=Discontinued  Goal 1) Learn and implement coping skills to decrease depression in order to feel less mentally exhausted  5 Point Likert rating baseline date: Target Date Goal Was reviewed Status Code Progress towards goal/Likert rating  03/22/2023           O              Goal 2) Learn and implement positive coping skills to decrease anxiety in order to feel less mentally exhausted  5 Point Likert rating baseline date: Target Date Goal Was reviewed Status Code Progress towards goal/Likert rating  03/22/2023            O  Goal 3) Process the trauma of the divorce and grieve the loss of the marriage  5 Point Likert rating baseline date: Target Date Goal Was reviewed Status Code Progress towards goal/Likert rating  03/22/2023          O               This plan has been reviewed and created by the following participants:  This plan will be  reviewed at least every 12 months. 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  We d/e/p recent events, their inability to resolve professional issues and her eventual departure.  In session, I helped [facility] create a script to break the news to her daughter, her parents and a response to parents who were reaching out expressing their regrets.  We were able to p/ the benefits of having been let go including have more free time to spend with her daughter.    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

## 2022-08-08 ENCOUNTER — Ambulatory Visit (INDEPENDENT_AMBULATORY_CARE_PROVIDER_SITE_OTHER): Payer: Commercial Managed Care - HMO | Admitting: Psychology

## 2022-08-08 DIAGNOSIS — F411 Generalized anxiety disorder: Secondary | ICD-10-CM

## 2022-08-08 NOTE — Progress Notes (Signed)
PROGRESS NOTE  Name: Erica [name] Date: 08/08/2022 MRN: [id number] DOB: 03/05/1981 PCP: Binnie Rail, MD  Time spent: 3:00-3:55 PM  Today I met with  Erica [name] in remote video (WebEx) face-to-face individual psychotherapy.   Distance Site: Client's Home Orginating Site: Dr Jannifer Franklin Remote Office Consent: Obtained verbal consent to transmit  session remotely    Reason for Visit /Presenting Problem: Erica [name] will be 42 this next week.  She has been treated for depression since she was a teenager.  Early on she learned skills for how to deal with her depression.  She came to [facility] in 2007 for graduate school.  In 2009 she sought treatment with Trey Paula.  Following Trey Paula' retirement, she wasn't ready to start treatment again until now.  Diagnosis: Major Depressive Disorder, Recurrent, Moderate   Individualized Treatment Plan                Strengths: intelligent, quick witted, funny, resourceful, people oriented, kind, adaptable  Supports: parents, 2 siblings, friends   Goal/Needs for Treatment:  In order of importance to patient 1) Learn and implement positive coping skills to decrease depression in order to feel less mentally exhausted. 2) Learn and implement positive coping skills to decrease anxiety in order to feel less mentally exhausted. 3) Process the trauma of the divorce and grieve the loss of the marriage    Client Statement of Needs: Pt states she needs "to be less mentally exhausted all the time and have energy to do things she wants to do."  Address the trauma of the divorce which she hasn't fully processed.  She wants to enjoy her life and worry less about all the things that aren't "right or haven't gone the way she expected."      Treatment Level:Weekly Outpatient Individual Psychotherapy  Symptoms: c/o that while depression is better managed, she has constant anxiety.  Hypervigilance, nervous, constantly worrying about everything, over  thinking,   She has a "weird relationship with food."  She doesn't feel hunger when she should because her stomach holds a lot of tension.  Often she won't eat until she feels faint or nauseous.  Sleep if discontinuous, initial sleep is good.  She sweats at night (not perimenopausal), has nightmares.  Struggles with fatigue, motivation, trouble getting started.  Client Treatment Preferences: Referred to this therapist by her previous therapist who retired the Museum/gallery exhibitions officer consumer's goal for treatment:  Psychologist, Royetta Crochet, [name].,  will support the patient's ability to achieve the goals identified. Cognitive Behavioral Therapy, Dialectical Behavioral Therapy, Motivational Interviewing, Behavior Activation, parenting skills and other evidenced-based practices will be used to promote progress towards healthy functioning.   [name] will: Actively participate in therapy, working towards healthy functioning.    *Justification for Continuation/Discontinuation of Goal: R=Revised, O=Ongoing, A=Achieved, D=Discontinued  Goal 1) Learn and implement coping skills to decrease depression in order to feel less mentally exhausted  5 Point Likert rating baseline date: Target Date Goal Was reviewed Status Code Progress towards goal/Likert rating  03/22/2023           O              Goal 2) Learn and implement positive coping skills to decrease anxiety in order to feel less mentally exhausted  5 Point Likert rating baseline date: Target Date Goal Was reviewed Status Code Progress towards goal/Likert rating  03/22/2023            O  Goal 3) Process the trauma of the divorce and grieve the loss of the marriage  5 Point Likert rating baseline date: Target Date Goal Was reviewed Status Code Progress towards goal/Likert rating  03/22/2023          O               This plan has been reviewed and created by the following participants:  This plan will  be reviewed at least every 12 months. Date Behavioral Health Clinician Date Guardian/Patient   03/21/2022 Royetta Crochet, [name]  03/21/2022 Estill Bamberg Yurick                    [name] reports that she has gotten a number of job offers since she was let go.  We continued to d/e/p how she has been impacted by the events, the "red flags" she minimized along the way and how she got here.  I made connections between her need to be close and be loyal get complicated by BPD.  I asked Su to consider and reflect over the next week the differences between connection, intimacy and merging.  I also encouraged her to create the sociogram we d/ doing in an earlier session as a tool in her refection process.  I gave her prompts for journaling on this subject and she seemed eager to reflect on the subject.    Home Practice:  Create a Sociogram of her relationships. Journal on the differences between connecting, intimacy and merging.      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

## 2022-08-15 ENCOUNTER — Ambulatory Visit (INDEPENDENT_AMBULATORY_CARE_PROVIDER_SITE_OTHER): Payer: Commercial Managed Care - HMO | Admitting: Psychology

## 2022-08-15 DIAGNOSIS — F411 Generalized anxiety disorder: Secondary | ICD-10-CM

## 2022-08-15 NOTE — Progress Notes (Signed)
PROGRESS NOTE  Name: Erica [name] Date: 08/15/2022 MRN: [id number] DOB: 06-Jul-1981 PCP: Binnie Rail, MD  Time spent: 3:00-3:55 PM  Today I met with  Erica [name] in remote video (WebEx) face-to-face individual psychotherapy.   Distance Site: Client's Home Orginating Site: Dr Jannifer Franklin Remote Office Consent: Obtained verbal consent to transmit  session remotely    Reason for Visit /Presenting Problem: Erica [name] will be 42 this next week.  She has been treated for depression since she was a teenager.  Early on she learned skills for how to deal with her depression.  She came to [facility] in 2007 for graduate school.  In 2009 she sought treatment with Erica [name].  Following Erica [name]' retirement, she wasn't ready to start treatment again until now.  Diagnosis: Major Depressive Disorder, Recurrent, Moderate   Individualized Treatment Plan                Strengths: intelligent, quick witted, funny, resourceful, people oriented, kind, adaptable  Supports: parents, 2 siblings, friends   Goal/Needs for Treatment:  In order of importance to patient 1) Learn and implement positive coping skills to decrease depression in order to feel less mentally exhausted. 2) Learn and implement positive coping skills to decrease anxiety in order to feel less mentally exhausted. 3) Process the trauma of the divorce and grieve the loss of the marriage    Client Statement of Needs: Pt states she needs "to be less mentally exhausted all the time and have energy to do things she wants to do."  Address the trauma of the divorce which she hasn't fully processed.  She wants to enjoy her life and worry less about all the things that aren't "right or haven't gone the way she expected."      Treatment Level:Weekly Outpatient Individual Psychotherapy  Symptoms: c/o that while depression is better managed, she has constant anxiety.  Hypervigilance, nervous, constantly worrying about everything, over  thinking,   She has a "weird relationship with food."  She doesn't feel hunger when she should because her stomach holds a lot of tension.  Often she won't eat until she feels faint or nauseous.  Sleep if discontinuous, initial sleep is good.  She sweats at night (not perimenopausal), has nightmares.  Struggles with fatigue, motivation, trouble getting started.  Client Treatment Preferences: Referred to this therapist by her previous therapist who retired the Museum/gallery exhibitions officer consumer's goal for treatment:  Erica [name], Erica [name], Erica [name].,  will support the patient's ability to achieve the goals identified. Cognitive Behavioral Therapy, Dialectical Behavioral Therapy, Motivational Interviewing, Behavior Activation, parenting skills and other evidenced-based practices will be used to promote progress towards healthy functioning.   [name] will: Actively participate in therapy, working towards healthy functioning.    *Justification for Continuation/Discontinuation of Goal: R=Revised, O=Ongoing, A=Achieved, D=Discontinued  Goal 1) Learn and implement coping skills to decrease depression in order to feel less mentally exhausted  5 Point Likert rating baseline date: Target Date Goal Was reviewed Status Code Progress towards goal/Likert rating  03/22/2023           O              Goal 2) Learn and implement positive coping skills to decrease anxiety in order to feel less mentally exhausted  5 Point Likert rating baseline date: Target Date Goal Was reviewed Status Code Progress towards goal/Likert rating  03/22/2023            O  Goal 3) Process the trauma of the divorce and grieve the loss of the marriage  5 Point Likert rating baseline date: Target Date Goal Was reviewed Status Code Progress towards goal/Likert rating  03/22/2023          O               This plan has been reviewed and created by the following participants:  This plan will  be reviewed at least every 12 months. Date Behavioral Health Clinician Date Guardian/Patient   03/21/2022 Erica [name], Erica [name]  03/21/2022 Erica [name]                    Erica [name] reports that she has been supported by the dance community she was affiliated with.  We acknowledged that with effort, she has shown a great deal of restraint in relation to recent communications.  We continued our d/ of behavior that in pact her relationships.  We used the sociogram I had her complete to facilitate this d/.  We d/e/p a recent friendship situation, how she reacted and how she might respond from the vantage point of emotional regulation.    Home Practice:  Create a Sociogram of her relationships. Journal on the differences between connecting, intimacy and merging.      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

## 2022-08-22 ENCOUNTER — Ambulatory Visit (INDEPENDENT_AMBULATORY_CARE_PROVIDER_SITE_OTHER): Payer: Commercial Managed Care - HMO | Admitting: Psychology

## 2022-08-22 DIAGNOSIS — F411 Generalized anxiety disorder: Secondary | ICD-10-CM | POA: Diagnosis not present

## 2022-08-22 NOTE — Progress Notes (Signed)
PROGRESS NOTE  Name: Erica [name] Date: 08/22/2022 MRN: [id number] DOB: 1981/07/17 PCP: Binnie Rail, MD  Time spent: 3:00-3:55 PM  Today I met with  Erica [name] in remote video (WebEx) face-to-face individual psychotherapy.   Distance Site: Client's Home Orginating Site: Dr Jannifer Franklin Remote Office Consent: Obtained verbal consent to transmit  session remotely    Reason for Visit /Presenting Problem: Erica [name] will be 42 this next week.  She has been treated for depression since she was a teenager.  Early on she learned skills for how to deal with her depression.  She came to [facility] in 2007 for graduate school.  In 2009 she sought treatment with Trey Paula.  Following Trey Paula' retirement, she wasn't ready to start treatment again until now.  Diagnosis: Major Depressive Disorder, Recurrent, Moderate   Individualized Treatment Plan                Strengths: intelligent, quick witted, funny, resourceful, people oriented, kind, adaptable  Supports: parents, 2 siblings, friends   Goal/Needs for Treatment:  In order of importance to patient 1) Learn and implement positive coping skills to decrease depression in order to feel less mentally exhausted. 2) Learn and implement positive coping skills to decrease anxiety in order to feel less mentally exhausted. 3) Process the trauma of the divorce and grieve the loss of the marriage    Client Statement of Needs: Pt states she needs "to be less mentally exhausted all the time and have energy to do things she wants to do."  Address the trauma of the divorce which she hasn't fully processed.  She wants to enjoy her life and worry less about all the things that aren't "right or haven't gone the way she expected."      Treatment Level:Weekly Outpatient Individual Psychotherapy  Symptoms: c/o that while depression is better managed, she has constant anxiety.  Hypervigilance, nervous, constantly worrying about everything, over  thinking,   She has a "weird relationship with food."  She doesn't feel hunger when she should because her stomach holds a lot of tension.  Often she won't eat until she feels faint or nauseous.  Sleep if discontinuous, initial sleep is good.  She sweats at night (not perimenopausal), has nightmares.  Struggles with fatigue, motivation, trouble getting started.  Client Treatment Preferences: Referred to this therapist by her previous therapist who retired the Museum/gallery exhibitions officer consumer's goal for treatment:  Psychologist, Royetta Crochet, [name].,  will support the patient's ability to achieve the goals identified. Cognitive Behavioral Therapy, Dialectical Behavioral Therapy, Motivational Interviewing, Behavior Activation, parenting skills and other evidenced-based practices will be used to promote progress towards healthy functioning.   [name] will: Actively participate in therapy, working towards healthy functioning.    *Justification for Continuation/Discontinuation of Goal: R=Revised, O=Ongoing, A=Achieved, D=Discontinued  Goal 1) Learn and implement coping skills to decrease depression in order to feel less mentally exhausted  5 Point Likert rating baseline date: Target Date Goal Was reviewed Status Code Progress towards goal/Likert rating  03/22/2023           O              Goal 2) Learn and implement positive coping skills to decrease anxiety in order to feel less mentally exhausted  5 Point Likert rating baseline date: Target Date Goal Was reviewed Status Code Progress towards goal/Likert rating  03/22/2023  O              Goal 3) Process the trauma of the divorce and grieve the loss of the marriage  5 Point Likert rating baseline date: Target Date Goal Was reviewed Status Code Progress towards goal/Likert rating  03/22/2023          O               This plan has been reviewed and created by the following participants:  This plan will  be reviewed at least every 12 months. Date Behavioral Health Clinician Date Guardian/Patient   03/21/2022 Royetta Crochet, [name]  03/21/2022 [name]                    Erica [name] reports that she had a lovely time at her friends for the Thanksgiving holiday.  Her daughter [name] had a nice time over the break and is looking forward to her birthday.  Erica [name] states that she is really getting in touch with her sad feelings and being discarded by the studio.  Erica [name] states that she was able to have a good conversation with her mother about how she hurt her feeling.  She noted that she was able to speak to her while regulating her emotions.      Home Practice:  Create a Sociogram of her relationships. Journal on the differences between connecting, intimacy and merging.      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

## 2022-08-29 ENCOUNTER — Ambulatory Visit (INDEPENDENT_AMBULATORY_CARE_PROVIDER_SITE_OTHER): Payer: Commercial Managed Care - HMO | Admitting: Psychology

## 2022-08-29 DIAGNOSIS — F411 Generalized anxiety disorder: Secondary | ICD-10-CM | POA: Diagnosis not present

## 2022-08-29 NOTE — Progress Notes (Signed)
PROGRESS NOTE  Name: Erica [name] Date: 08/29/2022 MRN: [id number] DOB: 01-Mar-1981 PCP: Binnie Rail, MD  Time spent: 3:00-3:55 PM  Today I met with  Erica [name] in remote video (WebEx) face-to-face individual psychotherapy.   Distance Site: Client's Home Orginating Site: Dr Jannifer Franklin Remote Office Consent: Obtained verbal consent to transmit  session remotely    Reason for Visit /Presenting Problem: Erica [name] will be 42 this next week.  She has been treated for depression since she was a teenager.  Early on she learned skills for how to deal with her depression.  She came to [facility] in 2007 for graduate school.  In 2009 she sought treatment with Trey Paula.  Following Trey Paula' retirement, she wasn't ready to start treatment again until now.  Diagnosis: Major Depressive Disorder, Recurrent, Moderate   Individualized Treatment Plan                Strengths: intelligent, quick witted, funny, resourceful, people oriented, kind, adaptable  Supports: parents, 2 siblings, friends   Goal/Needs for Treatment:  In order of importance to patient 1) Learn and implement positive coping skills to decrease depression in order to feel less mentally exhausted. 2) Learn and implement positive coping skills to decrease anxiety in order to feel less mentally exhausted. 3) Process the trauma of the divorce and grieve the loss of the marriage    Client Statement of Needs: Pt states she needs "to be less mentally exhausted all the time and have energy to do things she wants to do."  Address the trauma of the divorce which she hasn't fully processed.  She wants to enjoy her life and worry less about all the things that aren't "right or haven't gone the way she expected."      Treatment Level:Weekly Outpatient Individual Psychotherapy  Symptoms: c/o that while depression is better managed, she has constant anxiety.  Hypervigilance, nervous, constantly worrying about everything, over thinking,    She has a "weird relationship with food."  She doesn't feel hunger when she should because her stomach holds a lot of tension.  Often she won't eat until she feels faint or nauseous.  Sleep if discontinuous, initial sleep is good.  She sweats at night (not perimenopausal), has nightmares.  Struggles with fatigue, motivation, trouble getting started.  Client Treatment Preferences: Referred to this therapist by her previous therapist who retired the Museum/gallery exhibitions officer consumer's goal for treatment:  Psychologist, Erica [name], [name].,  will support the patient's ability to achieve the goals identified. Cognitive Behavioral Therapy, Dialectical Behavioral Therapy, Motivational Interviewing, Behavior Activation, parenting skills and other evidenced-based practices will be used to promote progress towards healthy functioning.   [name] will: Actively participate in therapy, working towards healthy functioning.    *Justification for Continuation/Discontinuation of Goal: R=Revised, O=Ongoing, A=Achieved, D=Discontinued  Goal 1) Learn and implement coping skills to decrease depression in order to feel less mentally exhausted  5 Point Likert rating baseline date: Target Date Goal Was reviewed Status Code Progress towards goal/Likert rating  03/22/2023           O              Goal 2) Learn and implement positive coping skills to decrease anxiety in order to feel less mentally exhausted  5 Point Likert rating baseline date: Target Date Goal Was reviewed Status Code Progress towards goal/Likert rating  03/22/2023            O  Goal 3) Process the trauma of the divorce and grieve the loss of the marriage  5 Point Likert rating baseline date: Target Date Goal Was reviewed Status Code Progress towards goal/Likert rating  03/22/2023          O               This plan has been reviewed and created by the following participants:  This plan will be  reviewed at least every 12 months. Date Behavioral Health Clinician Date Guardian/Patient   03/21/2022 Erica [name], [name]  03/21/2022 Erica [name]                    Erica [name] reports that her daughter Erica [name] turns 19 today and she is feeling emotional.  I invited her to reminisce about her daughter's milestones and to reflect on their relationship over the years.  Erica [name] states that Erica [name] father showed up to her birthday party uninvited.   We d/ how she might handle the situation differently next year.    Home Practice:  Create a Sociogram of her relationships. Journal on the differences between connecting, intimacy and merging.      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

## 2022-08-31 ENCOUNTER — Ambulatory Visit (INDEPENDENT_AMBULATORY_CARE_PROVIDER_SITE_OTHER): Payer: Commercial Managed Care - HMO | Admitting: Obstetrics & Gynecology

## 2022-08-31 ENCOUNTER — Encounter: Payer: Self-pay | Admitting: Obstetrics & Gynecology

## 2022-08-31 ENCOUNTER — Other Ambulatory Visit (HOSPITAL_COMMUNITY)
Admission: RE | Admit: 2022-08-31 | Discharge: 2022-08-31 | Disposition: A | Discharge status: Home / Self Care | Payer: Commercial Managed Care - HMO | Source: Ambulatory Visit | Attending: Obstetrics & Gynecology | Admitting: Obstetrics & Gynecology

## 2022-08-31 VITALS — BP 110/70 | HR 60 | Ht 67.25 in | Wt 211.0 lb

## 2022-08-31 DIAGNOSIS — Z01419 Encounter for gynecological examination (general) (routine) without abnormal findings: Secondary | ICD-10-CM | POA: Insufficient documentation

## 2022-08-31 DIAGNOSIS — Z30431 Encounter for routine checking of intrauterine contraceptive device: Secondary | ICD-10-CM | POA: Diagnosis not present

## 2022-08-31 NOTE — Progress Notes (Signed)
Erica [name] 04-Oct-1980 [id number]   History:    41 y.o. G1P1L1 Divorced.  Daughter is turning 47 yo this weekend.   RP:  Established patient presenting for annual gyn exam    HPI: Well on Mirena IUD since 08/2021.  No breakthrough bleeding.  No pelvic pain.  Chlam Pos 01/2022, TOC Neg 03/2022.  Abstinent x 03/2022.  PAP 07/2021 Neg. Pap reflex today.   Breasts normal.  Mammo Neg 10/2021. Urine and bowel movements normal.  Body mass index 32.8.  Good fitness and healthy nutrition.  Stopped smoking x 09/2019.  Health labs with family physician.  Flu vaccine done at pharmacy.    Past medical history,surgical history, family history and social history were all reviewed and documented in the EPIC chart.  Gynecologic History No LMP recorded. (Menstrual status: IUD).  Obstetric History OB History  Gravida Para Term Preterm AB Living  1 1 1  0 0 1  SAB IAB Ectopic Multiple Live Births  0 0 0 0 1    # Outcome Date GA Lbr Len/2nd Weight Sex Delivery Anes PTL Lv  1 Term 08/29/12 [redacted]w[redacted]d 06:22 / 00:14 7 lb 1.8 oz (3.226 kg) F Vag-Spont Local  LIV     ROS: A ROS was performed and pertinent positives and negatives are included in the history. GENERAL: No fevers or chills. HEENT: No change in vision, no earache, sore throat or sinus congestion. NECK: No pain or stiffness. CARDIOVASCULAR: No chest pain or pressure. No palpitations. PULMONARY: No shortness of breath, cough or wheeze. GASTROINTESTINAL: No abdominal pain, nausea, vomiting or diarrhea, melena or bright red blood per rectum. GENITOURINARY: No urinary frequency, urgency, hesitancy or dysuria. MUSCULOSKELETAL: No joint or muscle pain, no back pain, no recent trauma. DERMATOLOGIC: No rash, no itching, no lesions. ENDOCRINE: No polyuria, polydipsia, no heat or cold intolerance. No recent change in weight. HEMATOLOGICAL: No anemia or easy bruising or bleeding. NEUROLOGIC: No headache, seizures, numbness, tingling or weakness. PSYCHIATRIC: No  depression, no loss of interest in normal activity or change in sleep pattern.     Exam:   BP 110/70   Pulse 60   Ht 5' 7.25" (1.708 m)   Wt 211 lb (95.7 kg)   SpO2 98%   BMI 32.80 kg/m   Body mass index is 32.8 kg/m.  General appearance : Well developed well nourished female. No acute distress HEENT: Eyes: no retinal hemorrhage or exudates,  Neck supple, trachea midline, no carotid bruits, no thyroidmegaly Lungs: Clear to auscultation, no rhonchi or wheezes, or rib retractions  Heart: Regular rate and rhythm, no murmurs or gallops Breast:Examined in sitting and supine position were symmetrical in appearance, no palpable masses or tenderness,  no skin retraction, no nipple inversion, no nipple discharge, no skin discoloration, no axillary or supraclavicular lymphadenopathy Abdomen: no palpable masses or tenderness, no rebound or guarding Extremities: no edema or skin discoloration or tenderness  Pelvic: Vulva: Normal             Vagina: No gross lesions or discharge  Cervix: No gross lesions or discharge.  IUD strings visible at [facility].  Pap reflex done.  Uterus  AV, normal size, shape and consistency, non-tender and mobile  Adnexa  Without masses or tenderness  Anus: Normal   Assessment/Plan:  41 y.o. female for annual exam   1. Encounter for routine gynecological examination with Papanicolaou smear of cervix Well on Mirena IUD since 08/2021.  No breakthrough bleeding.  No pelvic pain.  Chlam Pos 01/2022,  TOC Neg 03/2022.  Abstinent x 03/2022.  PAP 07/2021 Neg. Pap reflex today.   Breasts normal.  Mammo Neg 10/2021. Urine and bowel movements normal.  Body mass index 32.8.  Good fitness and healthy nutrition.  Stopped smoking x 09/2019.  Health labs with family physician.  Flu vaccine done at pharmacy.  - Cytology - PAP( [place])  2. Encounter for routine checking of intrauterine contraceptive device (IUD) Well on Mirena IUD x 08/2021.  No BTB.  No Pelvic Pain.  IUD in good  position.  Other orders - atomoxetine (STRATTERA) 80 MG capsule; Take 80 mg by mouth daily.   Genia Del MD, 2:22 PM

## 2022-08-31 NOTE — Progress Notes (Signed)
  [name] [address] [place] [address] Phone: [phone] Subjective:   Erica [name], am serving as a scribe for Dr. Antoine Primas.  I'm seeing this patient by the request  of:  [name], MD  CC: back and neck pain follow up   [name]  Erica [name] is a 41 y.o. female coming in with complaint of back and neck pain. OMT 06/17/2022. Patient states the manipulation in her hip was very helpful, her left lateral knee has been flaring up, she thought it was part of the hip issue but since that is getting better and the knee is not she thinks it is its own separate issue.   Medications patient has been prescribed: None  Taking:         Reviewed prior external information including notes and imaging from previsou exam, outside providers and external EMR if available.   As well as notes that were available from care everywhere and other healthcare systems.  Past medical history, social, surgical and family history all reviewed in electronic medical record.  No pertanent information unless stated regarding to the chief complaint.   Past Medical History:  Diagnosis Date   Allergy    Anxiety    Depression    STD (sexually transmitted disease)    chlamydia treated 2023    No Known Allergies   Review of Systems:  No headache, visual changes, nausea, vomiting, diarrhea, constipation, dizziness, abdominal pain, skin rash, fevers, chills, night sweats, weight loss, swollen lymph nodes, body aches, joint swelling, chest pain, shortness of breath, mood changes. POSITIVE muscle aches  Objective  There were no vitals taken for this visit.   General: No apparent distress alert and oriented x3 mood and affect normal, dressed appropriately.  HEENT: Pupils equal, extraocular movements intact  Respiratory: Patient's speak in full sentences and does not appear short of breath  Cardiovascular: No lower extremity edema, non tender,  no erythema  Gait normal  MSK:  Back   Osteopathic findings  C3 flexed rotated and side bent right C5 flexed rotated and side bent left T3 extended rotated and side bent right inhaled rib T8 extended rotated and side bent left L2 flexed rotated and side bent right Sacrum right on right       Assessment and Plan:  No problem-specific Assessment & Plan notes found for this encounter.    Nonallopathic problems  Decision today to treat with OMT was based on Physical Exam  After verbal consent patient was treated with HVLA, ME, FPR techniques in cervical, rib, thoracic, lumbar, and sacral  areas  Patient tolerated the procedure well with improvement in symptoms  Patient given exercises, stretches and lifestyle modifications  See medications in patient instructions if given  Patient will follow up in 4-8 weeks      The above documentation has been reviewed and is accurate and complete Judi Saa, DO        Note: This dictation was prepared with Dragon dictation along with smaller phrase technology. Any transcriptional errors that result from this process are unintentional.

## 2022-09-02 ENCOUNTER — Other Ambulatory Visit: Payer: Self-pay | Admitting: Internal Medicine

## 2022-09-05 ENCOUNTER — Ambulatory Visit (INDEPENDENT_AMBULATORY_CARE_PROVIDER_SITE_OTHER): Payer: Commercial Managed Care - HMO | Admitting: Psychology

## 2022-09-05 DIAGNOSIS — F411 Generalized anxiety disorder: Secondary | ICD-10-CM

## 2022-09-05 DIAGNOSIS — F902 Attention-deficit hyperactivity disorder, combined type: Secondary | ICD-10-CM | POA: Diagnosis not present

## 2022-09-05 NOTE — Progress Notes (Signed)
PROGRESS NOTE  Name: Candee Hoon Faiella Date: 09/05/2022 MRN: [id number] DOB: 11/16/1980 PCP: Binnie Rail, MD  Time spent: 3:00-3:55 PM  Today I met with  Uvaldo Bristle Buckels in remote video (WebEx) face-to-face individual psychotherapy.   Distance Site: Client's Home Orginating Site: Dr Jannifer Franklin Remote Office Consent: Obtained verbal consent to transmit  session remotely    Reason for Visit /Presenting Problem: Katrianna Danielsen will be 42 this next week.  She has been treated for depression since she was a teenager.  Early on she learned skills for how to deal with her depression.  She came to [facility] in 2007 for graduate school.  In 2009 she sought treatment with Trey Paula.  Following Trey Paula' retirement, she wasn't ready to start treatment again until now.  Diagnosis: Major Depressive Disorder, Recurrent, Moderate   Individualized Treatment Plan                Strengths: intelligent, quick witted, funny, resourceful, people oriented, kind, adaptable  Supports: parents, 2 siblings, friends   Goal/Needs for Treatment:  In order of importance to patient 1) Learn and implement positive coping skills to decrease depression in order to feel less mentally exhausted. 2) Learn and implement positive coping skills to decrease anxiety in order to feel less mentally exhausted. 3) Process the trauma of the divorce and grieve the loss of the marriage    Client Statement of Needs: Pt states she needs "to be less mentally exhausted all the time and have energy to do things she wants to do."  Address the trauma of the divorce which she hasn't fully processed.  She wants to enjoy her life and worry less about all the things that aren't "right or haven't gone the way she expected."      Treatment Level:Weekly Outpatient Individual Psychotherapy  Symptoms: c/o that while depression is better managed, she has constant anxiety.  Hypervigilance, nervous, constantly worrying about everything, over  thinking,   She has a "weird relationship with food."  She doesn't feel hunger when she should because her stomach holds a lot of tension.  Often she won't eat until she feels faint or nauseous.  Sleep if discontinuous, initial sleep is good.  She sweats at night (not perimenopausal), has nightmares.  Struggles with fatigue, motivation, trouble getting started.  Client Treatment Preferences: Referred to this therapist by her previous therapist who retired the Museum/gallery exhibitions officer consumer's goal for treatment:  Psychologist, Royetta Crochet, [name].,  will support the patient's ability to achieve the goals identified. Cognitive Behavioral Therapy, Dialectical Behavioral Therapy, Motivational Interviewing, Behavior Activation, parenting skills and other evidenced-based practices will be used to promote progress towards healthy functioning.   Healthcare consumer Sharonlee Henthorn will: Actively participate in therapy, working towards healthy functioning.    *Justification for Continuation/Discontinuation of Goal: R=Revised, O=Ongoing, A=Achieved, D=Discontinued  Goal 1) Learn and implement coping skills to decrease depression in order to feel less mentally exhausted  5 Point Likert rating baseline date: Target Date Goal Was reviewed Status Code Progress towards goal/Likert rating  03/22/2023           O              Goal 2) Learn and implement positive coping skills to decrease anxiety in order to feel less mentally exhausted  5 Point Likert rating baseline date: Target Date Goal Was reviewed Status Code Progress towards goal/Likert rating  03/22/2023            O  Goal 3) Process the trauma of the divorce and grieve the loss of the marriage  5 Point Likert rating baseline date: Target Date Goal Was reviewed Status Code Progress towards goal/Likert rating  03/22/2023          O               This plan has been reviewed and created by the following participants:  This plan will  be reviewed at least every 12 months. Date Behavioral Health Clinician Date Guardian/Patient   03/21/2022 Royetta Crochet, [name]  03/21/2022 Estill Bamberg Carducci                    [name] reports that her end of term production went really well.  She received accolades and was invited to help with the next production.  She states that she had a "panic attack" when she got thinking about getting let go by the dance studio.  She called a friend and was able to re-regulate.  Later, she was able to meditate and the guided inner child meditation she did felt like a "break through."  We d/e/p where the blame and shame originates.  We were able to make connections between the past and the present.  I was then able to give her some guidance around emotion regulation and managing her escalating angry thoughts.  I also encouraged her to plan for the transition between appointments.    Home Practice:  Create a Sociogram of her relationships. Journal on the differences between connecting, intimacy and merging.      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

## 2022-09-06 ENCOUNTER — Ambulatory Visit: Payer: Commercial Managed Care - HMO | Admitting: Family Medicine

## 2022-09-06 VITALS — BP 140/90 | HR 72 | Ht 67.25 in | Wt 210.0 lb

## 2022-09-06 DIAGNOSIS — M9901 Segmental and somatic dysfunction of cervical region: Secondary | ICD-10-CM

## 2022-09-06 DIAGNOSIS — M9908 Segmental and somatic dysfunction of rib cage: Secondary | ICD-10-CM

## 2022-09-06 DIAGNOSIS — M9904 Segmental and somatic dysfunction of sacral region: Secondary | ICD-10-CM | POA: Diagnosis not present

## 2022-09-06 DIAGNOSIS — M9902 Segmental and somatic dysfunction of thoracic region: Secondary | ICD-10-CM | POA: Diagnosis not present

## 2022-09-06 DIAGNOSIS — M25552 Pain in left hip: Secondary | ICD-10-CM | POA: Diagnosis not present

## 2022-09-06 DIAGNOSIS — M222X2 Patellofemoral disorders, left knee: Secondary | ICD-10-CM

## 2022-09-06 DIAGNOSIS — M9903 Segmental and somatic dysfunction of lumbar region: Secondary | ICD-10-CM

## 2022-09-06 DIAGNOSIS — M25562 Pain in left knee: Secondary | ICD-10-CM

## 2022-09-06 LAB — CYTOLOGY - PAP
Comment: NEGATIVE
Diagnosis: NEGATIVE
High risk HPV: NEGATIVE

## 2022-09-06 NOTE — Patient Instructions (Addendum)
Good to see you  Get x ray of knee on your way out Tru pull lite brace given  Exercises given  Follow up in 6-8 weeks

## 2022-09-07 DIAGNOSIS — M222X2 Patellofemoral disorders, left knee: Secondary | ICD-10-CM | POA: Insufficient documentation

## 2022-09-07 NOTE — Assessment & Plan Note (Signed)
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  Given rehab exercises handout for VMO, hip abductors, core, entire kinetic chain including proprioception exercises.  Could benefit from PT, regular exercise, upright biking, and a PFS knee brace to assist with tracking abnormalities.  

## 2022-09-07 NOTE — Assessment & Plan Note (Addendum)
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  We discussed with patient about icing regimen and home exercises.  Discussed core strengthening as well.  Discussed stability as well.  Follow-up again in 6 to 8 weeks.  Did discuss medication management as well including patient cyclobenzaprine to the 10 mg up to 3 times a day as well as the Cymbalta 60 mg.  Patient seems to be doing relatively well.

## 2022-09-12 ENCOUNTER — Encounter: Payer: Self-pay | Admitting: *Deleted

## 2022-09-12 ENCOUNTER — Ambulatory Visit (INDEPENDENT_AMBULATORY_CARE_PROVIDER_SITE_OTHER): Payer: Commercial Managed Care - HMO | Admitting: Psychology

## 2022-09-12 DIAGNOSIS — F331 Major depressive disorder, recurrent, moderate: Secondary | ICD-10-CM | POA: Diagnosis not present

## 2022-09-12 NOTE — Progress Notes (Signed)
PROGRESS NOTE  Name: Erica [name] Date: 09/12/2022 MRN: [id number] DOB: 1981-04-09 PCP: Binnie Rail, MD  Time spent: 3:00-3:55 PM  Today I met with  Erica [name] in remote video (WebEx) face-to-face individual psychotherapy.   Distance Site: Client's Home Orginating Site: Dr Jannifer Franklin Remote Office Consent: Obtained verbal consent to transmit  session remotely    Reason for Visit /Presenting Problem: Erica [name] will be 42 this next week.  She has been treated for depression since she was a teenager.  Early on she learned skills for how to deal with her depression.  She came to [facility] in 2007 for graduate school.  In 2009 she sought treatment with Trey Paula.  Following Trey Paula' retirement, she wasn't ready to start treatment again until now.  Diagnosis: Major Depressive Disorder, Recurrent, Moderate   Individualized Treatment Plan                Strengths: intelligent, quick witted, funny, resourceful, people oriented, kind, adaptable  Supports: parents, 2 siblings, friends   Goal/Needs for Treatment:  In order of importance to patient 1) Learn and implement positive coping skills to decrease depression in order to feel less mentally exhausted. 2) Learn and implement positive coping skills to decrease anxiety in order to feel less mentally exhausted. 3) Process the trauma of the divorce and grieve the loss of the marriage    Client Statement of Needs: Pt states she needs "to be less mentally exhausted all the time and have energy to do things she wants to do."  Address the trauma of the divorce which she hasn't fully processed.  She wants to enjoy her life and worry less about all the things that aren't "right or haven't gone the way she expected."      Treatment Level:Weekly Outpatient Individual Psychotherapy  Symptoms: c/o that while depression is better managed, she has constant anxiety.  Hypervigilance, nervous, constantly worrying about everything, over  thinking,   She has a "weird relationship with food."  She doesn't feel hunger when she should because her stomach holds a lot of tension.  Often she won't eat until she feels faint or nauseous.  Sleep if discontinuous, initial sleep is good.  She sweats at night (not perimenopausal), has nightmares.  Struggles with fatigue, motivation, trouble getting started.  Client Treatment Preferences: Referred to this therapist by her previous therapist who retired the Museum/gallery exhibitions officer consumer's goal for treatment:  Psychologist, Erica [name], Erica [name].,  will support the patient's ability to achieve the goals identified. Cognitive Behavioral Therapy, Dialectical Behavioral Therapy, Motivational Interviewing, Behavior Activation, parenting skills and other evidenced-based practices will be used to promote progress towards healthy functioning.   [name] will: Actively participate in therapy, working towards healthy functioning.    *Justification for Continuation/Discontinuation of Goal: R=Revised, O=Ongoing, A=Achieved, D=Discontinued  Goal 1) Learn and implement coping skills to decrease depression in order to feel less mentally exhausted  5 Point Likert rating baseline date: Target Date Goal Was reviewed Status Code Progress towards goal/Likert rating  03/22/2023           O              Goal 2) Learn and implement positive coping skills to decrease anxiety in order to feel less mentally exhausted  5 Point Likert rating baseline date: Target Date Goal Was reviewed Status Code Progress towards goal/Likert rating  03/22/2023  O              Goal 3) Process the trauma of the divorce and grieve the loss of the marriage  5 Point Likert rating baseline date: Target Date Goal Was reviewed Status Code Progress towards goal/Likert rating  03/22/2023          O               This plan has been reviewed and created by the following participants:  This plan will  be reviewed at least every 12 months. Date Behavioral Health Clinician Date Guardian/Patient   03/21/2022 Erica [name], Erica [name]  03/21/2022 Erica [name]                    Erica [name] reports that she had a couple of tough situations occur this past week.  We d/e/p a situation she is attempting to negotiate with her ex-husband.  She sees that she will need to consult with her attorney to amend their current visitation agreement.  We also d/e/p a situation with her father that blew up, how she responded and how she attempted to negotiate the situation.  She states that she wrote him a letter this morning and gave it to him when she dropped him off at the airport.  Erica [name] shared what she wrote and we d/p what she wrote.  Erica [name] states that she decided not to vacation with her parents but to travel alone to [place] given recent events.  She feels that she will be able to more successfully relax and restore visiting with her cousins and other family members rather than risk having another conflict with her father.    Home Practice:  Create a Sociogram of her relationships. Journal on the differences between connecting, intimacy and merging.      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

## 2022-09-22 ENCOUNTER — Other Ambulatory Visit: Payer: Self-pay | Admitting: Internal Medicine

## 2022-09-27 ENCOUNTER — Ambulatory Visit: Payer: Commercial Managed Care - HMO | Admitting: Psychology

## 2022-09-27 DIAGNOSIS — F902 Attention-deficit hyperactivity disorder, combined type: Secondary | ICD-10-CM | POA: Diagnosis not present

## 2022-09-27 DIAGNOSIS — F331 Major depressive disorder, recurrent, moderate: Secondary | ICD-10-CM

## 2022-09-27 NOTE — Progress Notes (Signed)
PROGRESS NOTE  Name: Pessy Delamar Lukehart Date: 09/27/2022 MRN: [id number] DOB: [birth year] PCP: Binnie Rail, MD  Time spent: 2:00-2:55 PM  Today I met with  Uvaldo Bristle Westrich in remote video (WebEx) face-to-face individual psychotherapy.   Distance Site: Client's Home Orginating Site: Dr Jannifer Franklin Remote Office Consent: Obtained verbal consent to transmit  session remotely    Reason for Visit /Presenting Problem: Signa Habeeb will be 42 this next week.  She has been treated for depression since she was a teenager.  Early on she learned skills for how to deal with her depression.  She came to [facility] in 2007 for graduate school.  In 2009 she sought treatment with Trey Paula.  Following Trey Paula' retirement, she wasn't ready to start treatment again until now.  Diagnosis: Major Depressive Disorder, Recurrent, Moderate   Individualized Treatment Plan                Strengths: intelligent, quick witted, funny, resourceful, people oriented, kind, adaptable  Supports: parents, 2 siblings, friends   Goal/Needs for Treatment:  In order of importance to patient 1) Learn and implement positive coping skills to decrease depression in order to feel less mentally exhausted. 2) Learn and implement positive coping skills to decrease anxiety in order to feel less mentally exhausted. 3) Process the trauma of the divorce and grieve the loss of the marriage    Client Statement of Needs: Pt states she needs "to be less mentally exhausted all the time and have energy to do things she wants to do."  Address the trauma of the divorce which she hasn't fully processed.  She wants to enjoy her life and worry less about all the things that aren't "right or haven't gone the way she expected."      Treatment Level:Weekly Outpatient Individual Psychotherapy  Symptoms: c/o that while depression is better managed, she has constant anxiety.  Hypervigilance, nervous, constantly worrying about everything, over thinking,    She has a "weird relationship with food."  She doesn't feel hunger when she should because her stomach holds a lot of tension.  Often she won't eat until she feels faint or nauseous.  Sleep if discontinuous, initial sleep is good.  She sweats at night (not perimenopausal), has nightmares.  Struggles with fatigue, motivation, trouble getting started.  Client Treatment Preferences: Referred to this therapist by her previous therapist who retired the Museum/gallery exhibitions officer consumer's goal for treatment:  Psychologist, Royetta Crochet, [name].,  will support the patient's ability to achieve the goals identified. Cognitive Behavioral Therapy, Dialectical Behavioral Therapy, Motivational Interviewing, Behavior Activation, parenting skills and other evidenced-based practices will be used to promote progress towards healthy functioning.   Healthcare consumer Bernard Brink will: Actively participate in therapy, working towards healthy functioning.    *Justification for Continuation/Discontinuation of Goal: R=Revised, O=Ongoing, A=Achieved, D=Discontinued  Goal 1) Learn and implement coping skills to decrease depression in order to feel less mentally exhausted  5 Point Likert rating baseline date: Target Date Goal Was reviewed Status Code Progress towards goal/Likert rating  03/22/2023           O              Goal 2) Learn and implement positive coping skills to decrease anxiety in order to feel less mentally exhausted  5 Point Likert rating baseline date: Target Date Goal Was reviewed Status Code Progress towards goal/Likert rating  03/22/2023            O  Goal 3) Process the trauma of the divorce and grieve the loss of the marriage  5 Point Likert rating baseline date: Target Date Goal Was reviewed Status Code Progress towards goal/Likert rating  03/22/2023          O               This plan has been reviewed and created by the following participants:  This plan will be  reviewed at least every 12 months. Date Behavioral Health Clinician Date Guardian/Patient   03/21/2022 Royetta Crochet, [name]  03/21/2022 Estill Bamberg Ridener                    [name] reports that she had a nice vacation in [place].  She spent time with extended family and had a lovely time.  She had a number of realizations about her family and we d/e/p her new insights.  We d/ how to use radical acceptance, balanced thoughts and challenging her negative thinking.  [name] is also realizing that she doesn't have to be as "busy" as she has been this year and is beginning to wonder if she ever will.    The greater part of the session was spent d/p her relationship with her father and their recent blow up.  Per my suggestion, [name] wrote her father a letter, as a way to avoid the usual defensiveness and another angry outburst, which she shared with me during our previous session.  He since had responded.  We d/p his response and what she wished to say next in response.  [name] is able to see that setting boundaries on her father's verbally abusive outbursts is an important step in changing her patterns of behavior with the men in her life.    Home Practice:  Create a Sociogram of her relationships. Journal on the differences between connecting, intimacy and merging.      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

## 2022-10-02 ENCOUNTER — Telehealth: Payer: Self-pay | Admitting: Internal Medicine

## 2022-10-03 ENCOUNTER — Ambulatory Visit: Payer: PRIVATE HEALTH INSURANCE | Admitting: Psychology

## 2022-10-03 DIAGNOSIS — F331 Major depressive disorder, recurrent, moderate: Secondary | ICD-10-CM | POA: Diagnosis not present

## 2022-10-03 NOTE — Progress Notes (Signed)
PROGRESS NOTE  Name: Erica [name] Date: 10/03/2022 MRN: [id number] DOB: July 30, 1981 PCP: Binnie Rail, MD  Time spent: 3:00-3:55 PM  Today I met with  Erica [name] in remote video (WebEx) face-to-face individual psychotherapy.   Distance Site: Client's Home Orginating Site: Dr Jannifer Franklin Remote Office Consent: Obtained verbal consent to transmit  session remotely    Reason for Visit /Presenting Problem: Erica [name] will be 42 this next week.  She has been treated for depression since she was a teenager.  Early on she learned skills for how to deal with her depression.  She came to [facility] in 2007 for graduate school.  In 2009 she sought treatment with Erica [name].  Following Erica [name]' retirement, she wasn't ready to start treatment again until now.  Diagnosis: Major Depressive Disorder, Recurrent, Moderate   Individualized Treatment Plan                Strengths: intelligent, quick witted, funny, resourceful, people oriented, kind, adaptable  Supports: parents, 2 siblings, friends   Goal/Needs for Treatment:  In order of importance to patient 1) Learn and implement positive coping skills to decrease depression in order to feel less mentally exhausted. 2) Learn and implement positive coping skills to decrease anxiety in order to feel less mentally exhausted. 3) Process the trauma of the divorce and grieve the loss of the marriage    Client Statement of Needs: Pt states she needs "to be less mentally exhausted all the time and have energy to do things she wants to do."  Address the trauma of the divorce which she hasn't fully processed.  She wants to enjoy her life and worry less about all the things that aren't "right or haven't gone the way she expected."      Treatment Level:Weekly Outpatient Individual Psychotherapy  Symptoms: c/o that while depression is better managed, she has constant anxiety.  Hypervigilance, nervous, constantly worrying about everything, over  thinking,   She has a "weird relationship with food."  She doesn't feel hunger when she should because her stomach holds a lot of tension.  Often she won't eat until she feels faint or nauseous.  Sleep if discontinuous, initial sleep is good.  She sweats at night (not perimenopausal), has nightmares.  Struggles with fatigue, motivation, trouble getting started.  Client Treatment Preferences: Referred to this therapist by her previous therapist who retired the Museum/gallery exhibitions officer consumer's goal for treatment:  Psychologist, Erica [name], Erica [name].,  will support the patient's ability to achieve the goals identified. Cognitive Behavioral Therapy, Dialectical Behavioral Therapy, Motivational Interviewing, Behavior Activation, parenting skills and other evidenced-based practices will be used to promote progress towards healthy functioning.   [name] will: Actively participate in therapy, working towards healthy functioning.    *Justification for Continuation/Discontinuation of Goal: R=Revised, O=Ongoing, A=Achieved, D=Discontinued  Goal 1) Learn and implement coping skills to decrease depression in order to feel less mentally exhausted  5 Point Likert rating baseline date: Target Date Goal Was reviewed Status Code Progress towards goal/Likert rating  03/22/2023           O              Goal 2) Learn and implement positive coping skills to decrease anxiety in order to feel less mentally exhausted  5 Point Likert rating baseline date: Target Date Goal Was reviewed Status Code Progress towards goal/Likert rating  03/22/2023  O              Goal 3) Process the trauma of the divorce and grieve the loss of the marriage  5 Point Likert rating baseline date: Target Date Goal Was reviewed Status Code Progress towards goal/Likert rating  03/22/2023          O               This plan has been reviewed and created by the following participants:  This plan will  be reviewed at least every 12 months. Date Behavioral Health Clinician Date Guardian/Patient   03/21/2022 Erica [name], Erica [name]  03/21/2022 Erica [name]                    Erica [name] reports that she got COVID last week and had to take care of herself because there is "no one in her life."  She states that she got depressed but was able to reach out to a friend and eventually felt better.  We f/u on current happenings with her parents.  We d/e how she wants to spend Spring Break w/o her parents and what are possible options.  Mandy met with her attorney and decided to file contempt charges against her ex-husband.  We d/this as an opportunity to open the door to renegotiate their mediation agreement.  Related to self care, she has been more consistent with her journaling and either meditating or doing a restorative yoga pose.  Meal prepping and execution are still a problem.  We d/ how ADHD and fatigue are her main obstacles.  I offered a few strategies to help get her unstuck and encouraged her to get [name] involved in the food planning and prep process so that it felt less like a chore and more of a family activity.   Home Practice:  Journal on the differences between connecting, intimacy and merging.      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

## 2022-10-10 ENCOUNTER — Encounter: Payer: Self-pay | Admitting: Internal Medicine

## 2022-10-10 ENCOUNTER — Ambulatory Visit: Payer: PRIVATE HEALTH INSURANCE | Admitting: Psychology

## 2022-10-10 DIAGNOSIS — F331 Major depressive disorder, recurrent, moderate: Secondary | ICD-10-CM

## 2022-10-10 MED ORDER — ALPRAZOLAM 0.5 MG PO TABS: 0.5000 mg | ORAL_TABLET | Freq: Three times a day (TID) | ORAL | 0 refills | Status: DC | PRN

## 2022-10-10 NOTE — Telephone Encounter (Signed)
Patient called and said she wasn't able to pick this up because her insurance changed and she has to use a different pharmacy. She asked if it could please be sent into [facility] on [address] instead. She also scheduled for tomorrow morning for appointment

## 2022-10-10 NOTE — Progress Notes (Unsigned)
Subjective:    Patient ID: Erica [name], female    DOB: 09-15-1981, 42 y.o.   MRN: [id number]     HPI [place] is here for follow up of her chronic medical problems, including intermittent back spasms, anxiety, depression, ADD  Seeing a therapist.  Was seeing pscyh after being dx with ADD by therapist - no longer needs to see psych so asking me to rx atomoxetine.  This is helping a lot.   Using alprazolam as needed only - taking it 2-3 times a week   Taking meloxicam daily for knee pain.  Hip is getting better.  She occasionally takes the flexeril.   Medications and allergies reviewed with patient and updated if appropriate.  Current Outpatient Medications on File Prior to Visit  Medication Sig Dispense Refill   ALPRAZolam (XANAX) 0.5 MG tablet Take 1 tablet (0.5 mg total) by mouth 3 (three) times daily as needed for anxiety. Follow up needed for additional refills 45 tablet 0   atomoxetine (STRATTERA) 80 MG capsule Take 80 mg by mouth daily.     cyclobenzaprine (FLEXERIL) 10 MG tablet TAKE 1 TABLET(10 MG) BY MOUTH THREE TIMES DAILY AS NEEDED FOR MUSCLE SPASMS 30 tablet 3   DULoxetine (CYMBALTA) 60 MG capsule TAKE 1 CAPSULE(60 MG) BY MOUTH DAILY 90 capsule 0   meloxicam (MOBIC) 15 MG tablet TAKE 1 TABLET BY MOUTH DAILY 30 tablet 2   No current facility-administered medications on file prior to visit.     Review of Systems  Respiratory:  Negative for shortness of breath.   Cardiovascular:  Negative for chest pain and palpitations.  Neurological:  Positive for headaches (sinus). Negative for light-headedness.       Objective:   Vitals:   10/11/22 0923  BP: 136/84  Pulse: 79  Temp: 98.3 F (36.8 C)  SpO2: 98%   BP Readings from Last 3 Encounters:  10/11/22 136/84  09/06/22 (!) 140/90  08/31/22 110/70   Wt Readings from Last 3 Encounters:  10/11/22 211 lb (95.7 kg)  09/06/22 210 lb (95.3 kg)  08/31/22 211 lb (95.7 kg)   Body mass index is 32.8 kg/m.     Physical Exam Constitutional:      General: She is not in acute distress.    Appearance: Normal appearance.  HENT:     Head: Normocephalic and atraumatic.  Eyes:     Conjunctiva/sclera: Conjunctivae normal.  Cardiovascular:     Rate and Rhythm: Normal rate and regular rhythm.     Heart sounds: Normal heart sounds. No murmur heard. Pulmonary:     Effort: Pulmonary effort is normal. No respiratory distress.     Breath sounds: Normal breath sounds. No wheezing.  Musculoskeletal:     Cervical back: Neck supple.     Right lower leg: No edema.     Left lower leg: No edema.  Lymphadenopathy:     Cervical: No cervical adenopathy.  Skin:    General: Skin is warm and dry.     Findings: No rash.  Neurological:     Mental Status: She is alert. Mental status is at baseline.  Psychiatric:        Mood and Affect: Mood normal.        Behavior: Behavior normal.        Lab Results  Component Value Date   WBC 6.6 03/21/2022   HGB 13.3 03/21/2022   HCT 40.0 03/21/2022   PLT 268.0 03/21/2022   GLUCOSE 97 03/21/2022  CHOL 215 (H) 03/21/2022   TRIG 127.0 03/21/2022   HDL 43.20 03/21/2022   LDLDIRECT 154.6 01/25/2013   LDLCALC 147 (H) 03/21/2022   ALT 17 03/21/2022   AST 19 03/21/2022   NA 139 03/21/2022   K 4.0 03/21/2022   CL 105 03/21/2022   CREATININE 0.82 03/21/2022   BUN 13 03/21/2022   CO2 28 03/21/2022   TSH 2.12 03/21/2022     Assessment & Plan:    See Problem List for Assessment and Plan of chronic medical problems.

## 2022-10-10 NOTE — Addendum Note (Signed)
Addended by: Binnie Rail on: 10/10/2022 04:20 PM   Modules accepted: Orders

## 2022-10-10 NOTE — Progress Notes (Signed)
PROGRESS NOTE  Name: Erica [name] Date: 10/10/2022 MRN: [id number] DOB: Aug 15, 1981 PCP: Binnie Rail, MD  Time spent: 3:00-3:55 PM  Today I met with  Erica [name] in remote video (WebEx) face-to-face individual psychotherapy.   Distance Site: Client's Home Orginating Site: Dr Jannifer Franklin Remote Office Consent: Obtained verbal consent to transmit  session remotely    Reason for Visit /Presenting Problem: Erica [name] will be 42 this next week.  She has been treated for depression since she was a teenager.  Early on she learned skills for how to deal with her depression.  She came to [facility] in 2007 for graduate school.  In 2009 she sought treatment with Erica [name].  Following Erica [name]' retirement, she wasn't ready to start treatment again until now.  Diagnosis: Major Depressive Disorder, Recurrent, Moderate   Individualized Treatment Plan                Strengths: intelligent, quick witted, funny, resourceful, people oriented, kind, adaptable  Supports: parents, 2 siblings, friends   Goal/Needs for Treatment:  In order of importance to patient 1) Learn and implement positive coping skills to decrease depression in order to feel less mentally exhausted. 2) Learn and implement positive coping skills to decrease anxiety in order to feel less mentally exhausted. 3) Process the trauma of the divorce and grieve the loss of the marriage    Client Statement of Needs: Pt states she needs "to be less mentally exhausted all the time and have energy to do things she wants to do."  Address the trauma of the divorce which she hasn't fully processed.  She wants to enjoy her life and worry less about all the things that aren't "right or haven't gone the way she expected."      Treatment Level:Weekly Outpatient Individual Psychotherapy  Symptoms: c/o that while depression is better managed, she has constant anxiety.  Hypervigilance, nervous, constantly worrying about everything, over  thinking,   She has a "weird relationship with food."  She doesn't feel hunger when she should because her stomach holds a lot of tension.  Often she won't eat until she feels faint or nauseous.  Sleep if discontinuous, initial sleep is good.  She sweats at night (not perimenopausal), has nightmares.  Struggles with fatigue, motivation, trouble getting started.  Client Treatment Preferences: Referred to this therapist by her previous therapist who retired the Museum/gallery exhibitions officer consumer's goal for treatment:  Psychologist, Erica [name], [name].,  will support the patient's ability to achieve the goals identified. Cognitive Behavioral Therapy, Dialectical Behavioral Therapy, Motivational Interviewing, Behavior Activation, parenting skills and other evidenced-based practices will be used to promote progress towards healthy functioning.   [name] will: Actively participate in therapy, working towards healthy functioning.    *Justification for Continuation/Discontinuation of Goal: R=Revised, O=Ongoing, A=Achieved, D=Discontinued  Goal 1) Learn and implement coping skills to decrease depression in order to feel less mentally exhausted  5 Point Likert rating baseline date: Target Date Goal Was reviewed Status Code Progress towards goal/Likert rating  03/22/2023           O              Goal 2) Learn and implement positive coping skills to decrease anxiety in order to feel less mentally exhausted  5 Point Likert rating baseline date: Target Date Goal Was reviewed Status Code Progress towards goal/Likert rating  03/22/2023  O              Goal 3) Process the trauma of the divorce and grieve the loss of the marriage  5 Point Likert rating baseline date: Target Date Goal Was reviewed Status Code Progress towards goal/Likert rating  03/22/2023          O               This plan has been reviewed and created by the following participants:  This plan will  be reviewed at least every 12 months. Date Behavioral Health Clinician Date Guardian/Patient   03/21/2022 Erica [name], [name]  03/21/2022 Erica [name]                    Erica [name] reports that her father f/t and has an appointment on Wednesday with a therapist.  We d/e/p what she thought and felt about her father's announcement.  Erica [name] shared that she had a situation arise that triggered her and she was able to catch herself, pause and use her skills to calm herself.  We talked about how it is worth taking 10 minutes in order to not spend the next two days upset.  Erica [name] states that she has been doing some reading on narcissists in order to continue to learn and understand patterns in past relationships.  We d/e/p the schemas she brought in to these relationship, fear she "is doomed to repeat" bad choices and is attempting to change and transform in therapy.  We were also able to spend time e/ the ways in which she is the opposite of how she was made to feel about herself in these relationships.    Home Practice:  Journal on the differences between connecting, intimacy and merging.      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

## 2022-10-10 NOTE — Patient Instructions (Addendum)
     Medications changes include :   None      Return in about 6 months (around 04/11/2023) for follow up.

## 2022-10-11 ENCOUNTER — Ambulatory Visit (INDEPENDENT_AMBULATORY_CARE_PROVIDER_SITE_OTHER): Payer: 59 | Admitting: Internal Medicine

## 2022-10-11 VITALS — BP 136/84 | HR 79 | Temp 98.3°F | Ht 67.25 in | Wt 211.0 lb

## 2022-10-11 DIAGNOSIS — F3289 Other specified depressive episodes: Secondary | ICD-10-CM | POA: Diagnosis not present

## 2022-10-11 DIAGNOSIS — F902 Attention-deficit hyperactivity disorder, combined type: Secondary | ICD-10-CM

## 2022-10-11 DIAGNOSIS — M62830 Muscle spasm of back: Secondary | ICD-10-CM

## 2022-10-11 DIAGNOSIS — F411 Generalized anxiety disorder: Secondary | ICD-10-CM

## 2022-10-11 MED ORDER — ATOMOXETINE HCL 80 MG PO CAPS: 80.0000 mg | ORAL_CAPSULE | Freq: Every day | ORAL | 1 refills | Status: DC

## 2022-10-11 MED ORDER — ALPRAZOLAM 0.5 MG PO TABS: 0.5000 mg | ORAL_TABLET | Freq: Two times a day (BID) | ORAL | 0 refills | Status: DC | PRN

## 2022-10-11 NOTE — Assessment & Plan Note (Signed)
Chronic Controlled, Stable Continue duloxetine 60 mg daily 

## 2022-10-11 NOTE — Assessment & Plan Note (Addendum)
Chronic Controlled, Stable Continue duloxetine 60 mg daily, alprazolam 0.5 mg 3 times daily as needed - using is less - now taking it 2-3 times a week - not regularly.

## 2022-10-11 NOTE — Assessment & Plan Note (Signed)
New Diagnosed by her therapist - sent to pscyh and is taking atomoxetine 80 mg daily which has helped a lot I will prescribe this - no longer needs to see pscyh Continue atomoxetine 80 mg daily

## 2022-10-11 NOTE — Assessment & Plan Note (Addendum)
Chronic Intermittent Continue meloxicam 15 mg daily as needed - currently taking daily for left knee pain Continue Flexeril 10 mg 3 times daily as needed for spasms - not taking often

## 2022-10-17 ENCOUNTER — Ambulatory Visit: Payer: PRIVATE HEALTH INSURANCE | Admitting: Psychology

## 2022-10-17 DIAGNOSIS — F902 Attention-deficit hyperactivity disorder, combined type: Secondary | ICD-10-CM | POA: Diagnosis not present

## 2022-10-17 DIAGNOSIS — F331 Major depressive disorder, recurrent, moderate: Secondary | ICD-10-CM

## 2022-10-17 NOTE — Progress Notes (Signed)
PROGRESS NOTE  Name: Erica [name] Date: 10/17/2022 MRN: [id number] DOB: 08-24-1981 PCP: Erica Rail, MD  Time spent: 3:00-3:55 PM  Today I met with  Erica [name] in remote video (WebEx) face-to-face individual psychotherapy.   Distance Site: Erica [name]'s Home Orginating Site: Dr Erica [name] Remote Office Consent: Obtained verbal consent to transmit  session remotely    Reason for Visit /Presenting Problem: Erica [name] will be 42 this next week.  Erica [name] has been treated for depression since Erica [name] was a teenager.  Early on Erica [name] learned skills for how to deal with her depression.  Erica [name] came to [facility] in 2007 for graduate school.  In 2009 Erica [name] sought treatment with Erica [name].  Following Erica [name]' retirement, Erica [name] wasn't ready to start treatment again until now.  Diagnosis: Major Depressive Disorder, Recurrent, Moderate   Individualized Treatment Plan                Strengths: intelligent, quick witted, funny, resourceful, people oriented, kind, adaptable  Supports: parents, 2 siblings, friends   Goal/Needs for Treatment:  In order of importance to patient 1) Learn and implement positive coping skills to decrease depression in order to feel less mentally exhausted. 2) Learn and implement positive coping skills to decrease anxiety in order to feel less mentally exhausted. 3) Process the trauma of the divorce and grieve the loss of the marriage    Erica [name] Statement of Needs: Erica [name] states Erica [name] needs "to be less mentally exhausted all the time and have energy to do things Erica [name] wants to do."  Address the trauma of the divorce which Erica [name] hasn't fully processed.  Erica [name] wants to enjoy her life and worry less about all the things that aren't "right or haven't gone the way Erica [name] expected."      Treatment Level:Weekly Outpatient Individual Psychotherapy  Symptoms: c/o that while depression is better managed, Erica [name] has constant anxiety.  Hypervigilance, nervous, constantly worrying about everything, over thinking,    Erica [name] has a "weird relationship with food."  Erica [name] doesn't feel hunger when Erica [name] should because her stomach holds a lot of tension.  Often Erica [name] won't eat until Erica [name] feels faint or nauseous.  Sleep if discontinuous, initial sleep is good.  Erica [name] sweats at night (not perimenopausal), has nightmares.  Struggles with fatigue, motivation, trouble getting started.  Erica [name] Treatment Preferences: Referred to this therapist by her previous therapist who retired the Museum/gallery exhibitions officer consumer's goal for treatment:  Psychologist, Erica [name], [name].,  will support the patient's ability to achieve the goals identified. Cognitive Behavioral Therapy, Dialectical Behavioral Therapy, Motivational Interviewing, Behavior Activation, parenting skills and other evidenced-based practices will be used to promote progress towards healthy functioning.   [name] will: Actively participate in therapy, working towards healthy functioning.    *Justification for Continuation/Discontinuation of Goal: R=Revised, O=Ongoing, A=Achieved, D=Discontinued  Goal 1) Learn and implement coping skills to decrease depression in order to feel less mentally exhausted  5 Point Likert rating baseline date: Target Date Goal Was reviewed Status Code Progress towards goal/Likert rating  03/22/2023           O              Goal 2) Learn and implement positive coping skills to decrease anxiety in order to feel less mentally exhausted  5 Point Likert rating baseline date: Target Date Goal Was reviewed Status Code Progress towards goal/Likert rating  03/22/2023            O  Goal 3) Process the trauma of the divorce and grieve the loss of the marriage  5 Point Likert rating baseline date: Target Date Goal Was reviewed Status Code Progress towards goal/Likert rating  03/22/2023          O               This plan has been reviewed and created by the following participants:  This plan will be  reviewed at least every 12 months. Date Behavioral Health Clinician Date Guardian/Patient   03/21/2022 Erica [name], [name]  03/21/2022 Erica [name]                    Erica [name] reports that Erica [name] did a better job of taking care of herself this week.  Erica [name], however, notes that Erica [name] struggles to slow down in the evening and seems to have a hard time stopping whatever Erica [name] is using.  We d/e/p what is driving her inability to let herself wind down after her daughter goes to sleep "without relying on a glass of wine or a bowl" (marijuana).  We made the connection between habitually being in fight or flight mode and learning to how to calm her nervous system before  bedtime.  I provided some psycho education around good sleep hygiene and made a number of suggestions.  Erica [name] agreed to give it a try and make the adjustment suggested.    Home Practice:  Journal on the differences between connecting, intimacy and merging.      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

## 2022-10-24 ENCOUNTER — Ambulatory Visit: Payer: PRIVATE HEALTH INSURANCE | Admitting: Psychology

## 2022-10-24 DIAGNOSIS — F331 Major depressive disorder, recurrent, moderate: Secondary | ICD-10-CM | POA: Diagnosis not present

## 2022-10-24 NOTE — Progress Notes (Signed)
PROGRESS NOTE  Name: Gennie Eisinger Zelek Date: 10/24/2022 MRN: [id number] DOB: August 28, 1981 PCP: Binnie Rail, MD  Time spent: 3:00-3:55 PM  Today I met with  Erica [name] in remote video (WebEx) face-to-face individual psychotherapy.   Distance Site: Client's Home Orginating Site: Dr Jannifer Franklin Remote Office Consent: Obtained verbal consent to transmit  session remotely    Reason for Visit /Presenting Problem: Erica [name] will be 42 this next week.  She has been treated for depression since she was a teenager.  Early on she learned skills for how to deal with her depression.  She came to [facility] in 2007 for graduate school.  In 2009 she sought treatment with Trey Paula.  Following Trey Paula' retirement, she wasn't ready to start treatment again until now.  Diagnosis: Major Depressive Disorder, Recurrent, Moderate   Individualized Treatment Plan                Strengths: intelligent, quick witted, funny, resourceful, people oriented, kind, adaptable  Supports: parents, 2 siblings, friends   Goal/Needs for Treatment:  In order of importance to patient 1) Learn and implement positive coping skills to decrease depression in order to feel less mentally exhausted. 2) Learn and implement positive coping skills to decrease anxiety in order to feel less mentally exhausted. 3) Process the trauma of the divorce and grieve the loss of the marriage    Client Statement of Needs: Pt states she needs "to be less mentally exhausted all the time and have energy to do things she wants to do."  Address the trauma of the divorce which she hasn't fully processed.  She wants to enjoy her life and worry less about all the things that aren't "right or haven't gone the way she expected."      Treatment Level:Weekly Outpatient Individual Psychotherapy  Symptoms: c/o that while depression is better managed, she has constant anxiety.  Hypervigilance, nervous, constantly worrying about everything, over thinking,    She has a "weird relationship with food."  She doesn't feel hunger when she should because her stomach holds a lot of tension.  Often she won't eat until she feels faint or nauseous.  Sleep if discontinuous, initial sleep is good.  She sweats at night (not perimenopausal), has nightmares.  Struggles with fatigue, motivation, trouble getting started.  Client Treatment Preferences: Referred to this therapist by her previous therapist who retired the Museum/gallery exhibitions officer consumer's goal for treatment:  Psychologist, Erica [name], [name].,  will support the patient's ability to achieve the goals identified. Cognitive Behavioral Therapy, Dialectical Behavioral Therapy, Motivational Interviewing, Behavior Activation, parenting skills and other evidenced-based practices will be used to promote progress towards healthy functioning.   [name] will: Actively participate in therapy, working towards healthy functioning.    *Justification for Continuation/Discontinuation of Goal: R=Revised, O=Ongoing, A=Achieved, D=Discontinued  Goal 1) Learn and implement coping skills to decrease depression in order to feel less mentally exhausted  5 Point Likert rating baseline date: Target Date Goal Was reviewed Status Code Progress towards goal/Likert rating  03/22/2023           O              Goal 2) Learn and implement positive coping skills to decrease anxiety in order to feel less mentally exhausted  5 Point Likert rating baseline date: Target Date Goal Was reviewed Status Code Progress towards goal/Likert rating  03/22/2023            O  Goal 3) Process the trauma of the divorce and grieve the loss of the marriage  5 Point Likert rating baseline date: Target Date Goal Was reviewed Status Code Progress towards goal/Likert rating  03/22/2023          O               This plan has been reviewed and created by the following participants:  This plan will be  reviewed at least every 12 months. Date Behavioral Health Clinician Date Guardian/Patient   03/21/2022 Erica [name], [name]  03/21/2022 Erica [name]                    Erica [name] reports that she adjusted her winding down and sleep routines this past week as I suggested.  We d/p that she found it easier to get up and manage the rest of the day.  We d/ other ways she would continue to benefit from regulating her sleep/wake cycle.  Felina states that she is still really mad with her father and wants to tell him why they are not traveling to see them over Spring break.  We d/e/p how she was feeling, ongoing casual daily check ins and her motivations.  In the end she was able to see that she was simply wanting to punish him for making her feel bad again.  I was also able to get her to see that in fact it was her ruminating on the conversation that was making her feel bad in the moment and that she has already said what she needed to say in the letter.    Home Practice:  Journal on the differences between connecting, intimacy and merging.      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

## 2022-10-31 ENCOUNTER — Ambulatory Visit: Payer: PRIVATE HEALTH INSURANCE | Admitting: Psychology

## 2022-10-31 DIAGNOSIS — F331 Major depressive disorder, recurrent, moderate: Secondary | ICD-10-CM

## 2022-10-31 DIAGNOSIS — F902 Attention-deficit hyperactivity disorder, combined type: Secondary | ICD-10-CM

## 2022-10-31 NOTE — Progress Notes (Signed)
PROGRESS NOTE  Name: Erica [name] Date: 10/31/2022 MRN: [id number] DOB: 1981-06-01 PCP: Erica Sanes, MD  Time spent: 3:00-3:55 PM  Today I met with  Erica [name] in remote video (WebEx) face-to-face individual psychotherapy.   Distance Site: Client's Home Orginating Site: Dr Erica [name] Remote Office Consent: Obtained verbal consent to transmit  session remotely    Reason for Visit /Presenting Problem: Erica [name] will be 42 this next week.  She has been treated for depression since she was a teenager.  Early on she learned skills for how to deal with her depression.  She came to [facility] in 2007 for graduate school.  In 2009 she sought treatment with Erica [name].  Following Erica [name]' retirement, she wasn't ready to start treatment again until now.  Diagnosis: Major Depressive Disorder, Recurrent, Moderate   Individualized Treatment Plan                Strengths: intelligent, quick witted, funny, resourceful, people oriented, kind, adaptable  Supports: parents, 2 siblings, friends   Goal/Needs for Treatment:  In order of importance to patient 1) Learn and implement positive coping skills to decrease depression in order to feel less mentally exhausted. 2) Learn and implement positive coping skills to decrease anxiety in order to feel less mentally exhausted. 3) Process the trauma of the divorce and grieve the loss of the marriage    Client Statement of Needs: Pt states she needs "to be less mentally exhausted all the time and have energy to do things she wants to do."  Address the trauma of the divorce which she hasn't fully processed.  She wants to enjoy her life and worry less about all the things that aren't "right or haven't gone the way she expected."      Treatment Level:Weekly Outpatient Individual Psychotherapy  Symptoms: c/o that while depression is better managed, she has constant anxiety.  Hypervigilance, nervous, constantly worrying about everything, over thinking,    She has a "weird relationship with food."  She doesn't feel hunger when she should because her stomach holds a lot of tension.  Often she won't eat until she feels faint or nauseous.  Sleep if discontinuous, initial sleep is good.  She sweats at night (not perimenopausal), has nightmares.  Struggles with fatigue, motivation, trouble getting started.  Client Treatment Preferences: Referred to this therapist by her previous therapist who retired the Physicist, medical consumer's goal for treatment:  Psychologist, Erica [name], [name].,  will support the patient's ability to achieve the goals identified. Cognitive Behavioral Therapy, Dialectical Behavioral Therapy, Motivational Interviewing, Behavior Activation, parenting skills and other evidenced-based practices will be used to promote progress towards healthy functioning.   [name] will: Actively participate in therapy, working towards healthy functioning.    *Justification for Continuation/Discontinuation of Goal: R=Revised, O=Ongoing, A=Achieved, D=Discontinued  Goal 1) Learn and implement coping skills to decrease depression in order to feel less mentally exhausted  5 Point Likert rating baseline date: Target Date Goal Was reviewed Status Code Progress towards goal/Likert rating  03/22/2023           O              Goal 2) Learn and implement positive coping skills to decrease anxiety in order to feel less mentally exhausted  5 Point Likert rating baseline date: Target Date Goal Was reviewed Status Code Progress towards goal/Likert rating  03/22/2023            O  Goal 3) Process the trauma of the divorce and grieve the loss of the marriage  5 Point Likert rating baseline date: Target Date Goal Was reviewed Status Code Progress towards goal/Likert rating  03/22/2023          O               This plan has been reviewed and created by the following participants:  This plan will be  reviewed at least every 12 months. Date Behavioral Health Clinician Date Guardian/Patient   03/21/2022 Erica [name], [name]  03/21/2022 Erica [name]                    Erica [name] reports that she had a good week.  She reflected on why she needs to function at a high speed and doesn't feels safe at slow speeds.  We did some d/e/p to understand the roots of her "need for speed."  We made connections between the past and the present.  We were then able to d/ and p/s how to begin to create new habits in order to slow her pace and feel secure.  I encouraged her to begin a daily meditation practice to foster mindful behavior and presence.    Home Practice:  Journal on the differences between connecting, intimacy and merging.      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

## 2022-11-02 NOTE — Progress Notes (Signed)
  Erica [name] [address] [place] Phone: [phone] Subjective:   IVilma [name], am serving as a scribe for Dr. Hulan Saas.  I'm seeing this patient by the request  of:  Binnie Rail, MD  CC: Low back pain  [id number]  Erica [name] is a 42 y.o. female coming in with complaint of back and neck pain. OMT 09/06/2022. Also f/u for L knee and L hip pain. Patient states getting progressively better.  Medications patient has been prescribed:   Taking:         Reviewed prior external information including notes and imaging from previsou exam, outside providers and external EMR if available.   As well as notes that were available from care everywhere and other healthcare systems.  Past medical history, social, surgical and family history all reviewed in electronic medical record.  No pertanent information unless stated regarding to the chief complaint.   Past Medical History:  Diagnosis Date   Allergy    Anxiety    Depression    STD (sexually transmitted disease)    chlamydia treated 2023    No Known Allergies   Review of Systems:  No headache, visual changes, nausea, vomiting, diarrhea, constipation, dizziness, abdominal pain, skin rash, fevers, chills, night sweats, weight loss, swollen lymph nodes, body aches, joint swelling, chest pain, shortness of breath, mood changes. POSITIVE muscle aches  Objective  Blood pressure 126/84, pulse 90, height 5\' 7"  (1.702 m), weight 213 lb (96.6 kg), SpO2 96 %.   General: No apparent distress alert and oriented x3 mood and affect normal, dressed appropriately.  HEENT: Pupils equal, extraocular movements intact  Respiratory: Patient's speak in full sentences and does not appear short of breath  Cardiovascular: No lower extremity edema, non tender, no erythema  Low back does have tightness noted on the left side of the paraspinal musculature.  Patient does have some limited  range of motion of the left hip.  Osteopathic findings  C3 flexed rotated and side bent right C7 flexed rotated and side bent left T3 extended rotated and side bent right inhaled rib T9 extended rotated and side bent left L2 flexed rotated and side bent right Sacrum left on left       Assessment and Plan:  Left hip pain Discussed posture and ergonomics, discussed which activities to do and which ones to avoid.     Nonallopathic problems  Decision today to treat with OMT was based on Physical Exam  After verbal consent patient was treated with HVLA, ME, FPR techniques in cervical, rib, thoracic, lumbar, and sacral  areas  Patient tolerated the procedure well with improvement in symptoms  Patient given exercises, stretches and lifestyle modifications  See medications in patient instructions if given  Patient will follow up in 4-8 weeks     The above documentation has been reviewed and is accurate and complete [name], DO         Note: This dictation was prepared with Dragon dictation along with smaller phrase technology. Any transcriptional errors that result from this process are unintentional.

## 2022-11-04 ENCOUNTER — Ambulatory Visit (INDEPENDENT_AMBULATORY_CARE_PROVIDER_SITE_OTHER): Payer: 59 | Admitting: Family Medicine

## 2022-11-04 VITALS — BP 126/84 | HR 90 | Ht 67.0 in | Wt 213.0 lb

## 2022-11-04 DIAGNOSIS — M9902 Segmental and somatic dysfunction of thoracic region: Secondary | ICD-10-CM

## 2022-11-04 DIAGNOSIS — M9908 Segmental and somatic dysfunction of rib cage: Secondary | ICD-10-CM | POA: Diagnosis not present

## 2022-11-04 DIAGNOSIS — M25552 Pain in left hip: Secondary | ICD-10-CM

## 2022-11-04 DIAGNOSIS — M9901 Segmental and somatic dysfunction of cervical region: Secondary | ICD-10-CM

## 2022-11-04 DIAGNOSIS — M9903 Segmental and somatic dysfunction of lumbar region: Secondary | ICD-10-CM | POA: Diagnosis not present

## 2022-11-04 DIAGNOSIS — M9904 Segmental and somatic dysfunction of sacral region: Secondary | ICD-10-CM

## 2022-11-04 NOTE — Assessment & Plan Note (Addendum)
Discussed posture and ergonomics, discussed which activities to do and which ones to avoid.  Patient continues to have what seems to be more like labral pathology that we will continue to monitor.  Follow-up again in 6 to 8 weeks continue with core strengthening.

## 2022-11-04 NOTE — Patient Instructions (Signed)
See you again in 2-3 months

## 2022-11-07 ENCOUNTER — Ambulatory Visit: Payer: PRIVATE HEALTH INSURANCE | Admitting: Psychology

## 2022-11-07 DIAGNOSIS — F902 Attention-deficit hyperactivity disorder, combined type: Secondary | ICD-10-CM | POA: Diagnosis not present

## 2022-11-07 DIAGNOSIS — F411 Generalized anxiety disorder: Secondary | ICD-10-CM

## 2022-11-07 NOTE — Progress Notes (Unsigned)
PROGRESS NOTE  Name: Bertilla Shattles Burnley Date: 11/07/2022 MRN: [id number] DOB: 1981-04-18 PCP: Binnie Rail, MD  Time spent: 3:00-3:55 PM  Today I met with  Uvaldo Bristle Jaime in remote video (WebEx) face-to-face individual psychotherapy.   Distance Site: Client's Home Orginating Site: Dr Jannifer Franklin Remote Office Consent: Obtained verbal consent to transmit  session remotely    Reason for Visit /Presenting Problem: Jahmiah Mcnew will be 42 this next week.  She has been treated for depression since she was a teenager.  Early on she learned skills for how to deal with her depression.  She came to [facility] in 2007 for graduate school.  In 2009 she sought treatment with Trey Paula.  Following Trey Paula' retirement, she wasn't ready to start treatment again until now.  Diagnosis: Major Depressive Disorder, Recurrent, Moderate   Individualized Treatment Plan                Strengths: intelligent, quick witted, funny, resourceful, people oriented, kind, adaptable  Supports: parents, 2 siblings, friends   Goal/Needs for Treatment:  In order of importance to patient 1) Learn and implement positive coping skills to decrease depression in order to feel less mentally exhausted. 2) Learn and implement positive coping skills to decrease anxiety in order to feel less mentally exhausted. 3) Process the trauma of the divorce and grieve the loss of the marriage    Client Statement of Needs: Pt states she needs "to be less mentally exhausted all the time and have energy to do things she wants to do."  Address the trauma of the divorce which she hasn't fully processed.  She wants to enjoy her life and worry less about all the things that aren't "right or haven't gone the way she expected."      Treatment Level:Weekly Outpatient Individual Psychotherapy  Symptoms: c/o that while depression is better managed, she has constant anxiety.  Hypervigilance, nervous, constantly worrying about everything, over  thinking,   She has a "weird relationship with food."  She doesn't feel hunger when she should because her stomach holds a lot of tension.  Often she won't eat until she feels faint or nauseous.  Sleep if discontinuous, initial sleep is good.  She sweats at night (not perimenopausal), has nightmares.  Struggles with fatigue, motivation, trouble getting started.  Client Treatment Preferences: Referred to this therapist by her previous therapist who retired the Museum/gallery exhibitions officer consumer's goal for treatment:  Psychologist, Royetta Crochet, [name].,  will support the patient's ability to achieve the goals identified. Cognitive Behavioral Therapy, Dialectical Behavioral Therapy, Motivational Interviewing, Behavior Activation, parenting skills and other evidenced-based practices will be used to promote progress towards healthy functioning.   Healthcare consumer Shelicia Cefalu will: Actively participate in therapy, working towards healthy functioning.    *Justification for Continuation/Discontinuation of Goal: R=Revised, O=Ongoing, A=Achieved, D=Discontinued  Goal 1) Learn and implement coping skills to decrease depression in order to feel less mentally exhausted  5 Point Likert rating baseline date: Target Date Goal Was reviewed Status Code Progress towards goal/Likert rating  03/22/2023           O              Goal 2) Learn and implement positive coping skills to decrease anxiety in order to feel less mentally exhausted  5 Point Likert rating baseline date: Target Date Goal Was reviewed Status Code Progress towards goal/Likert rating  03/22/2023  O              Goal 3) Process the trauma of the divorce and grieve the loss of the marriage  5 Point Likert rating baseline date: Target Date Goal Was reviewed Status Code Progress towards goal/Likert rating  03/22/2023          O               This plan has been reviewed and created by the following participants:  This plan will  be reviewed at least every 12 months. Date Behavioral Health Clinician Date Guardian/Patient   03/21/2022 Royetta Crochet, [name]  03/21/2022 [name]                    Harrison reports that she had a good week.  She states that she tried to practice self compassion this week.  We d/e/p the ways in which she practiced self compassion this week.  Erminia states there was a situation in which she became emotionally dysregulated, what that looked like and the skills she used to attempt to up regulate.  We made some connections between the past and present.     Home Practice:  Journal on the differences between connecting, intimacy and merging.      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

## 2022-11-14 ENCOUNTER — Ambulatory Visit: Payer: PRIVATE HEALTH INSURANCE | Admitting: Psychology

## 2022-11-14 DIAGNOSIS — F411 Generalized anxiety disorder: Secondary | ICD-10-CM | POA: Diagnosis not present
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
## 2022-11-21 ENCOUNTER — Ambulatory Visit: Payer: PRIVATE HEALTH INSURANCE | Admitting: Psychology

## 2022-11-21 DIAGNOSIS — F411 Generalized anxiety disorder: Secondary | ICD-10-CM

## 2022-11-21 NOTE — Progress Notes (Signed)
PROGRESS NOTE  Name: Erica [name] Date: 11/21/2022 MRN: [id number] DOB: March 20, 1981 PCP: Binnie Rail, MD  Time spent: 3:00-3:55 PM  Today I met with  Erica [name] in remote video (WebEx) face-to-face individual psychotherapy.   Distance Site: Client's Home Orginating Site: Dr Jannifer Franklin Remote Office Consent: Obtained verbal consent to transmit  session remotely    Reason for Visit /Presenting Problem: Erica [name] will be 42 this next week.  She has been treated for depression since she was a teenager.  Early on she learned skills for how to deal with her depression.  She came to [name] in 2007 for graduate school.  In 2009 she sought treatment with [place] [name].  Following Erica [name]' retirement, she wasn't ready to start treatment again until now.  Diagnosis: Major Depressive Disorder, Recurrent, Moderate   Individualized Treatment Plan                Strengths: intelligent, quick witted, funny, resourceful, people oriented, kind, adaptable  Supports: parents, 2 siblings, friends   Goal/Needs for Treatment:  In order of importance to patient 1) Learn and implement positive coping skills to decrease depression in order to feel less mentally exhausted. 2) Learn and implement positive coping skills to decrease anxiety in order to feel less mentally exhausted. 3) Process the trauma of the divorce and grieve the loss of the marriage    Client Statement of Needs: Pt states she needs "to be less mentally exhausted all the time and have energy to do things she wants to do."  Address the trauma of the divorce which she hasn't fully processed.  She wants to enjoy her life and worry less about all the things that aren't "right or haven't gone the way she expected."      Treatment Level:Weekly Outpatient Individual Psychotherapy  Symptoms: c/o that while depression is better managed, she has constant anxiety.  Hypervigilance, nervous, constantly worrying about everything, over  thinking,   She has a "weird relationship with food."  She doesn't feel hunger when she should because her stomach holds a lot of tension.  Often she won't eat until she feels faint or nauseous.  Sleep if discontinuous, initial sleep is good.  She sweats at night (not perimenopausal), has nightmares.  Struggles with fatigue, motivation, trouble getting started.  Client Treatment Preferences: Referred to this therapist by her previous therapist who retired the Museum/gallery exhibitions officer consumer's goal for treatment:  [place]rica [name], [place]a [name], [name].,  will support the patient's ability to achieve the goals identified. Cognitive Behavioral Therapy, Dialectical Behavioral Therapy, Motivational Interviewing, Behavior Activation, parenting skills and other evidenced-based practices will be used to promote progress towards healthy functioning.   [name] will: Actively participate in therapy, working towards healthy functioning.    *Justification for Continuation/Discontinuation of Goal: R=Revised, O=Ongoing, A=Achieved, D=Discontinued  Goal 1) Learn and implement coping skills to decrease depression in order to feel less mentally exhausted  5 Point Likert rating baseline date: Target Date Goal Was reviewed Status Code Progress towards goal/Likert rating  03/22/2023           O              Goal 2) Learn and implement positive coping skills to decrease anxiety in order to feel less mentally exhausted  5 Point Likert rating baseline date: Target Date Goal Was reviewed Status Code Progress towards goal/Likert rating  03/22/2023  O              Goal 3) Process the trauma of the divorce and grieve the loss of the marriage  5 Point Likert rating baseline date: Target Date Goal Was reviewed Status Code Progress towards goal/Likert rating  03/22/2023          O               This plan has been reviewed and created by the following participants:  This plan will  be reviewed at least every 12 months. Date Behavioral Health Clinician Date Guardian/Patient   03/21/2022 Erica [name], [name]  03/21/2022 Erica [name]                    Erica [name] reports that she was upset on Friday night which left her feeling unsafe.  Unfortunately, she took a call from her mother and she got emotionally dysregulated.  We d/ that it would have been best had she listened to her intuition.  We were then able to d/e/p why things went the way they did, family scripts and fight-flight response.  I provided some psycho education on simple behaviors and techniques to close the stress cycle and down regulate.  Dama felt confident that she would use these options.   Home Practice:  Journal on the differences between connecting, intimacy and merging.      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

## 2022-11-28 ENCOUNTER — Ambulatory Visit (INDEPENDENT_AMBULATORY_CARE_PROVIDER_SITE_OTHER): Payer: 59 | Admitting: Psychology

## 2022-11-28 DIAGNOSIS — F411 Generalized anxiety disorder: Secondary | ICD-10-CM

## 2022-11-28 NOTE — Progress Notes (Signed)
PROGRESS NOTE  Name: Erica [name] Date: 11/28/2022 MRN: [id number] DOB: 1981-02-17 PCP: Binnie Rail, MD  Time spent: 3:00-3:55 PM  Today I met with  Uvaldo Bristle Ottaway in remote video (WebEx) face-to-face individual psychotherapy.   Distance Site: Client's Home Orginating Site: Dr Jannifer Franklin Remote Office Consent: Obtained verbal consent to transmit  session remotely    Reason for Visit /Presenting Problem: Sundee Beever will be 42 this next week.  She has been treated for depression since she was a teenager.  Early on she learned skills for how to deal with her depression.  She came to [facility] in 2007 for graduate school.  In 2009 she sought treatment with Trey Paula.  Following Trey Paula' retirement, she wasn't ready to start treatment again until now.  Diagnosis: Major Depressive Disorder, Recurrent, Moderate   Individualized Treatment Plan                Strengths: intelligent, quick witted, funny, resourceful, people oriented, kind, adaptable  Supports: parents, 2 siblings, friends   Goal/Needs for Treatment:  In order of importance to patient 1) Learn and implement positive coping skills to decrease depression in order to feel less mentally exhausted. 2) Learn and implement positive coping skills to decrease anxiety in order to feel less mentally exhausted. 3) Process the trauma of the divorce and grieve the loss of the marriage    Client Statement of Needs: Pt states she needs "to be less mentally exhausted all the time and have energy to do things she wants to do."  Address the trauma of the divorce which she hasn't fully processed.  She wants to enjoy her life and worry less about all the things that aren't "right or haven't gone the way she expected."      Treatment Level:Weekly Outpatient Individual Psychotherapy  Symptoms: c/o that while depression is better managed, she has constant anxiety.  Hypervigilance, nervous, constantly worrying about everything, over  thinking,   She has a "weird relationship with food."  She doesn't feel hunger when she should because her stomach holds a lot of tension.  Often she won't eat until she feels faint or nauseous.  Sleep if discontinuous, initial sleep is good.  She sweats at night (not perimenopausal), has nightmares.  Struggles with fatigue, motivation, trouble getting started.  Client Treatment Preferences: Referred to this therapist by her previous therapist who retired the Museum/gallery exhibitions officer consumer's goal for treatment:  Psychologist, Royetta Crochet, [name].,  will support the patient's ability to achieve the goals identified. Cognitive Behavioral Therapy, Dialectical Behavioral Therapy, Motivational Interviewing, Behavior Activation, parenting skills and other evidenced-based practices will be used to promote progress towards healthy functioning.   Healthcare consumer Mama Mazzie will: Actively participate in therapy, working towards healthy functioning.    *Justification for Continuation/Discontinuation of Goal: R=Revised, O=Ongoing, A=Achieved, D=Discontinued  Goal 1) Learn and implement coping skills to decrease depression in order to feel less mentally exhausted  5 Point Likert rating baseline date: Target Date Goal Was reviewed Status Code Progress towards goal/Likert rating  03/22/2023           O              Goal 2) Learn and implement positive coping skills to decrease anxiety in order to feel less mentally exhausted  5 Point Likert rating baseline date: Target Date Goal Was reviewed Status Code Progress towards goal/Likert rating  03/22/2023  O              Goal 3) Process the trauma of the divorce and grieve the loss of the marriage  5 Point Likert rating baseline date: Target Date Goal Was reviewed Status Code Progress towards goal/Likert rating  03/22/2023          O               This plan has been reviewed and created by the following participants:  This plan will  be reviewed at least every 12 months. Date Behavioral Health Clinician Date Guardian/Patient   03/21/2022 Royetta Crochet, [name]  03/21/2022 [name]                    Arjanae reports that it was a relatively good week.  [name] was at her father's house this weekend.  [name] called Zillah with a concern while she was at her father's house.  We d/e/p what occurred, how she felt and how she responded.  We d/ and created an action plan to respond to ex-husband's behavior.   Home Practice:  Journal on the differences between connecting, intimacy and merging.      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

## 2022-12-01 ENCOUNTER — Other Ambulatory Visit: Payer: Self-pay | Admitting: Internal Medicine

## 2022-12-05 ENCOUNTER — Ambulatory Visit (INDEPENDENT_AMBULATORY_CARE_PROVIDER_SITE_OTHER): Payer: PRIVATE HEALTH INSURANCE | Admitting: Psychology

## 2022-12-05 DIAGNOSIS — F411 Generalized anxiety disorder: Secondary | ICD-10-CM

## 2022-12-05 NOTE — Telephone Encounter (Signed)
Patient  called again and is needing this medication sent in.

## 2022-12-05 NOTE — Progress Notes (Signed)
PROGRESS NOTE  Name: Erica [name] Date: 12/05/2022 MRN: [id number] DOB: 02-Aug-1981 PCP: Binnie Rail, MD  Time spent: 3:00-3:55 PM  Today I met with  Erica [name] in remote video (WebEx) face-to-face individual psychotherapy.   Distance Site: Client's Home Orginating Site: Dr Jannifer Franklin Remote Office Consent: Obtained verbal consent to transmit  session remotely    Reason for Visit /Presenting Problem: Erica [name] will be 42 this next week.  She has been treated for depression since she was a teenager.  Early on she learned skills for how to deal with her depression.  She came to [facility] in 2007 for graduate school.  In 2009 she sought treatment with Erica [name].  Following Erica [name]' retirement, she wasn't ready to start treatment again until now.  Diagnosis: Major Depressive Disorder, Recurrent, Moderate   Individualized Treatment Plan                Strengths: intelligent, quick witted, funny, resourceful, people oriented, kind, adaptable  Supports: parents, 2 siblings, friends   Goal/Needs for Treatment:  In order of importance to patient 1) Learn and implement positive coping skills to decrease depression in order to feel less mentally exhausted. 2) Learn and implement positive coping skills to decrease anxiety in order to feel less mentally exhausted. 3) Process the trauma of the divorce and grieve the loss of the marriage    Client Statement of Needs: Pt states she needs "to be less mentally exhausted all the time and have energy to do things she wants to do."  Address the trauma of the divorce which she hasn't fully processed.  She wants to enjoy her life and worry less about all the things that aren't "right or haven't gone the way she expected."      Treatment Level:Weekly Outpatient Individual Psychotherapy  Symptoms: c/o that while depression is better managed, she has constant anxiety.  Hypervigilance, nervous, constantly worrying about everything, over  thinking,   She has a "weird relationship with food."  She doesn't feel hunger when she should because her stomach holds a lot of tension.  Often she won't eat until she feels faint or nauseous.  Sleep if discontinuous, initial sleep is good.  She sweats at night (not perimenopausal), has nightmares.  Struggles with fatigue, motivation, trouble getting started.  Client Treatment Preferences: Referred to this therapist by her previous therapist who retired the Museum/gallery exhibitions officer consumer's goal for treatment:  Psychologist, Erica [name], [name].,  will support the patient's ability to achieve the goals identified. Cognitive Behavioral Therapy, Dialectical Behavioral Therapy, Motivational Interviewing, Behavior Activation, parenting skills and other evidenced-based practices will be used to promote progress towards healthy functioning.   [name] will: Actively participate in therapy, working towards healthy functioning.    *Justification for Continuation/Discontinuation of Goal: R=Revised, O=Ongoing, A=Achieved, D=Discontinued  Goal 1) Learn and implement coping skills to decrease depression in order to feel less mentally exhausted  5 Point Likert rating baseline date: Target Date Goal Was reviewed Status Code Progress towards goal/Likert rating  03/22/2023           O              Goal 2) Learn and implement positive coping skills to decrease anxiety in order to feel less mentally exhausted  5 Point Likert rating baseline date: Target Date Goal Was reviewed Status Code Progress towards goal/Likert rating  03/22/2023  O              Goal 3) Process the trauma of the divorce and grieve the loss of the marriage  5 Point Likert rating baseline date: Target Date Goal Was reviewed Status Code Progress towards goal/Likert rating  03/22/2023          O               This plan has been reviewed and created by the following participants:  This plan will  be reviewed at least every 12 months. Date Behavioral Health Clinician Date Guardian/Patient   03/21/2022 Erica [name], [name]  03/21/2022 Erica [name]                    Erica [name] reports she had a restful Spring Break.  We d/p her conversation with her dad and she state all went well.  Erica [name] also states that Erica [name] got sick.  We noted that she did a great job of responding and not reacting as she would have in the past.  I made the connection to mindfulness in action.    We also d/ that she hasn't journaled on what she would like her dating profile to look like.  We d/e some strategies for how she might approach online dating and who might be possible resources for her to consult.  More importantly, we d/p her fear of rejection and tendency to over personalize.     Home Practice:  Journal on the differences between connecting, intimacy and merging.      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

## 2022-12-12 ENCOUNTER — Ambulatory Visit (INDEPENDENT_AMBULATORY_CARE_PROVIDER_SITE_OTHER): Payer: PRIVATE HEALTH INSURANCE | Admitting: Psychology

## 2022-12-12 DIAGNOSIS — F411 Generalized anxiety disorder: Secondary | ICD-10-CM | POA: Diagnosis not present

## 2022-12-12 NOTE — Progress Notes (Signed)
PROGRESS NOTE  Name: Erica [name] Date: 12/12/2022 MRN: [id number] DOB: 08-Sep-1981 PCP: Binnie Rail, MD  Time spent: 3:00-3:55 PM  Today I met with  Erica [name] in remote video (WebEx) face-to-face individual psychotherapy.   Distance Site: Client's Home Orginating Site: Dr Jannifer Franklin Remote Office Consent: Obtained verbal consent to transmit  session remotely    Reason for Visit /Presenting Problem: Erica [name] will be 42 this next week.  Erica [name] has been treated for depression since Erica [name] was a teenager.  Early on Erica [name] learned skills for how to deal with her depression.  Erica [name] came to [facility] in 2007 for graduate school.  In 2009 Erica [name] sought treatment with Erica [name].  Following Erica [name]' retirement, Erica [name] wasn't ready to start treatment again until now.  Diagnosis: Major Depressive Disorder, Recurrent, Moderate   Individualized Treatment Plan                Strengths: intelligent, quick witted, funny, resourceful, people oriented, kind, adaptable  Supports: parents, 2 siblings, friends   Goal/Needs for Treatment:  In order of importance to patient 1) Learn and implement positive coping skills to decrease depression in order to feel less mentally exhausted. 2) Learn and implement positive coping skills to decrease anxiety in order to feel less mentally exhausted. 3) Process the trauma of the divorce and grieve the loss of the marriage    Client Statement of Needs: Erica [name] states Erica [name] needs "to be less mentally exhausted all the time and have energy to do things Erica [name] wants to do."  Address the trauma of the divorce which Erica [name] hasn't fully processed.  Erica [name] wants to enjoy her life and worry less about all the things that aren't "right or haven't gone the way Erica [name] expected."      Treatment Level:Weekly Outpatient Individual Psychotherapy  Symptoms: c/o that while depression is better managed, Erica [name] has constant anxiety.  Hypervigilance, nervous, constantly worrying about everything, over  thinking,   Erica [name] has a "weird relationship with food."  Erica [name] doesn't feel hunger when Erica [name] should because her stomach holds a lot of tension.  Often Erica [name] won't eat until Erica [name] feels faint or nauseous.  Sleep if discontinuous, initial sleep is good.  Erica [name] sweats at night (not perimenopausal), has nightmares.  Struggles with fatigue, motivation, trouble getting started.  Client Treatment Preferences: Referred to this therapist by her previous therapist who retired the Museum/gallery exhibitions officer consumer's goal for treatment:  Erica [name], Erica [name], [name].,  will support the patient's ability to achieve the goals identified. Cognitive Behavioral Therapy, Dialectical Behavioral Therapy, Motivational Interviewing, Behavior Activation, parenting skills and other evidenced-based practices will be used to promote progress towards healthy functioning.   [name] will: Actively participate in therapy, working towards healthy functioning.    *Justification for Continuation/Discontinuation of Goal: R=Revised, O=Ongoing, A=Achieved, D=Discontinued  Goal 1) Learn and implement coping skills to decrease depression in order to feel less mentally exhausted  5 Point Likert rating baseline date: Target Date Goal Was reviewed Status Code Progress towards goal/Likert rating  03/22/2023           O              Goal 2) Learn and implement positive coping skills to decrease anxiety in order to feel less mentally exhausted  5 Point Likert rating baseline date: Target Date Goal Was reviewed Status Code Progress towards goal/Likert rating  03/22/2023  O              Goal 3) Process the trauma of the divorce and grieve the loss of the marriage  5 Point Likert rating baseline date: Target Date Goal Was reviewed Status Code Progress towards goal/Likert rating  03/22/2023          O               This plan has been reviewed and created by the following participants:  This plan will  be reviewed at least every 12 months. Date Behavioral Health Clinician Date Guardian/Patient   03/21/2022 Erica [name], [name]  03/21/2022 Erica [name]                    Erica [name] reports that Erica [name] had an amazing week.  Erica [name] shared all the highlights and ways in which Erica [name] feels  like Erica [name] is making progress.  Erica [name] was especially eager to d/e/p her conversation with her father and his therapy.  Erica [name] invited her parents to come for a visit and we anticipated what that visit would be like and how to set limits that would help manage her anxiety.   Home Practice:  Journal on the differences between connecting, intimacy and merging.      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

## 2022-12-19 ENCOUNTER — Ambulatory Visit (INDEPENDENT_AMBULATORY_CARE_PROVIDER_SITE_OTHER): Payer: PRIVATE HEALTH INSURANCE | Admitting: Psychology

## 2022-12-19 DIAGNOSIS — F339 Major depressive disorder, recurrent, unspecified: Secondary | ICD-10-CM

## 2022-12-19 DIAGNOSIS — F411 Generalized anxiety disorder: Secondary | ICD-10-CM | POA: Diagnosis not present

## 2022-12-19 NOTE — Progress Notes (Signed)
PROGRESS NOTE  Name: Enia Vivenzio Franchi Date: 12/19/2022 MRN: [id number] DOB: 05-May-1981 PCP: Binnie Rail, MD  Time spent: 3:00-3:55 PM  Today I met with  Uvaldo Bristle Mcdiarmid in remote video (WebEx) face-to-face individual psychotherapy.   Distance Site: Client's Home Orginating Site: Dr Jannifer Franklin Remote Office Consent: Obtained verbal consent to transmit  session remotely    Reason for Visit /Presenting Problem: Dalexa Mcpeek will be 42 this next week.  She has been treated for depression since she was a teenager.  Early on she learned skills for how to deal with her depression.  She came to [facility] in 2007 for graduate school.  In 2009 she sought treatment with Trey Paula.  Following Trey Paula' retirement, she wasn't ready to start treatment again until now.   Individualized Treatment Plan                Strengths: intelligent, quick witted, funny, resourceful, people oriented, kind, adaptable  Supports: parents, 2 siblings, friends   Goal/Needs for Treatment:  In order of importance to patient 1) Learn and implement positive coping skills to decrease depression in order to feel less mentally exhausted. 2) Learn and implement positive coping skills to decrease anxiety in order to feel less mentally exhausted. 3) Process the trauma of the divorce and grieve the loss of the marriage    Client Statement of Needs: Pt states she needs "to be less mentally exhausted all the time and have energy to do things she wants to do."  Address the trauma of the divorce which she hasn't fully processed.  She wants to enjoy her life and worry less about all the things that aren't "right or haven't gone the way she expected."      Treatment Level:Weekly Outpatient Individual Psychotherapy  Symptoms: c/o that while depression is better managed, she has constant anxiety.  Hypervigilance, nervous, constantly worrying about everything, over thinking,   She has a "weird relationship with food."  She doesn't  feel hunger when she should because her stomach holds a lot of tension.  Often she won't eat until she feels faint or nauseous.  Sleep if discontinuous, initial sleep is good.  She sweats at night (not perimenopausal), has nightmares.  Struggles with fatigue, motivation, trouble getting started.  Client Treatment Preferences: Referred to this therapist by her previous therapist who retired the Museum/gallery exhibitions officer consumer's goal for treatment:  Psychologist, Royetta Crochet, [name].,  will support the patient's ability to achieve the goals identified. Cognitive Behavioral Therapy, Dialectical Behavioral Therapy, Motivational Interviewing, Behavior Activation, parenting skills and other evidenced-based practices will be used to promote progress towards healthy functioning.   Healthcare consumer Laqueta Acoff will: Actively participate in therapy, working towards healthy functioning.    *Justification for Continuation/Discontinuation of Goal: R=Revised, O=Ongoing, A=Achieved, D=Discontinued  Goal 1) Learn and implement coping skills to decrease depression in order to feel less mentally exhausted  5 Point Likert rating baseline date: Target Date Goal Was reviewed Status Code Progress towards goal/Likert rating  03/22/2023           O              Goal 2) Learn and implement positive coping skills to decrease anxiety in order to feel less mentally exhausted  5 Point Likert rating baseline date: Target Date Goal Was reviewed Status Code Progress towards goal/Likert rating  03/22/2023            O  Goal 3) Process the trauma of the divorce and grieve the loss of the marriage  5 Point Likert rating baseline date: Target Date Goal Was reviewed Status Code Progress towards goal/Likert rating  03/22/2023          O               This plan has been reviewed and created by the following participants:  This plan will be reviewed at least every 12 months. Date Behavioral Health  Clinician Date Guardian/Patient   03/21/2022 Royetta Crochet, [name]  03/21/2022 Estill Bamberg Calvey                   Diagnosis: Major Depressive Disorder, Recurrent, Moderate   Alora reports that she spoke with her attorney and her ex-husband will be served this week.  She was anxious about fallout as he is very passive aggressive.  We d/ and p/s how she will handle visitation exchange.  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  Jacquise saw this as an improvement because in the past she would have "pushed through" instead of taking care of herself.  We d/ the strategies she used to get emotionally regulated.  I taught her a breathing excise the create bilateral stimulation of her CNS for calming.   Once she was calm enough we were able to d/  what occurred and how or if  she wanted to respond to each of these situations.    Home Practice:  Journal on the differences between connecting, intimacy and merging.      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

## 2022-12-26 ENCOUNTER — Telehealth: Payer: Self-pay | Admitting: Internal Medicine

## 2022-12-26 ENCOUNTER — Ambulatory Visit (INDEPENDENT_AMBULATORY_CARE_PROVIDER_SITE_OTHER): Payer: PRIVATE HEALTH INSURANCE | Admitting: Psychology

## 2022-12-26 DIAGNOSIS — F411 Generalized anxiety disorder: Secondary | ICD-10-CM | POA: Diagnosis not present

## 2022-12-26 NOTE — Telephone Encounter (Signed)
Prescription Request  12/26/2022  LOV: 10/11/2022  What is the name of the medication or equipment? atomoxetine (STRATTERA) 80 MG capsule   Have you contacted your pharmacy to request a refill? No   Which pharmacy would you like this sent to?  [id number] [address], [place] [address] [phone] [phone]    Patient notified that their request is being sent to the clinical staff for review and that they should receive a response within 2 business days.   Please advise at Mobile [phone] (mobile)

## 2022-12-26 NOTE — Progress Notes (Signed)
PROGRESS NOTE  Name: Erica [name] Date: 12/26/2022 MRN: [id number] DOB: 1980-09-27 PCP: Binnie Rail, MD  Time spent: 3:00-3:55 PM  Today I met with  Erica [name] in remote video (WebEx) face-to-face individual psychotherapy.   Distance Site: Client's Home Orginating Site: Dr Jannifer Franklin Remote Office Consent: Obtained verbal consent to transmit  session remotely    Reason for Visit /Presenting Problem: Erica [name].  She has been treated for depression since she was a teenager.  Early on she learned skills for how to deal with her depression.  She came to [facility] in 2007 for graduate school.  In 2009 she sought treatment with Trey Paula.  Following Trey Paula' retirement, she wasn't ready to start treatment again until now.   Individualized Treatment Plan                Strengths: intelligent, quick witted, funny, resourceful, people oriented, kind, adaptable  Supports: parents, 2 siblings, friends   Goal/Needs for Treatment:  In order of importance to patient 1) Learn and implement positive coping skills to decrease depression in order to feel less mentally exhausted. 2) Learn and implement positive coping skills to decrease anxiety in order to feel less mentally exhausted. 3) Process the trauma of the divorce and grieve the loss of the marriage    Client Statement of Needs: Pt states she needs "to be less mentally exhausted all the time and have energy to do things she wants to do."  Address the trauma of the divorce which she hasn't fully processed.  She wants to enjoy her life and worry less about all the things that aren't "right or haven't gone the way she expected."      Treatment Level:Weekly Outpatient Individual Psychotherapy  Symptoms: c/o that while depression is better managed, she has constant anxiety.  Hypervigilance, nervous, constantly worrying about everything, over thinking,   She has a "weird relationship with food."  She doesn't  feel hunger when she should because her stomach holds a lot of tension.  Often she won't eat until she feels faint or nauseous.  Sleep if discontinuous, initial sleep is good.  She sweats at night (not perimenopausal), has nightmares.  Struggles with fatigue, motivation, trouble getting started.  Client Treatment Preferences: Referred to this therapist by her previous therapist who retired the Museum/gallery exhibitions officer consumer's goal for treatment:  Psychologist, Erica [name], Erica [name].,  will support the patient's ability to achieve the goals identified. Cognitive Behavioral Therapy, Dialectical Behavioral Therapy, Motivational Interviewing, Behavior Activation, parenting skills and other evidenced-based practices will be used to promote progress towards healthy functioning.   Healthcare consumer Taris Rayman will: Actively participate in therapy, working towards healthy functioning.    *Justification for Continuation/Discontinuation of Goal: R=Revised, O=Ongoing, A=Achieved, D=Discontinued  Goal 1) Learn and implement coping skills to decrease depression in order to feel less mentally exhausted  5 Point Likert rating baseline date: Target Date Goal Was reviewed Status Code Progress towards goal/Likert rating  03/22/2023           O              Goal 2) Learn and implement positive coping skills to decrease anxiety in order to feel less mentally exhausted  5 Point Likert rating baseline date: Target Date Goal Was reviewed Status Code Progress towards goal/Likert rating  03/22/2023            O  Goal 3) Process the trauma of the divorce and grieve the loss of the marriage  5 Point Likert rating baseline date: Target Date Goal Was reviewed Status Code Progress towards goal/Likert rating  03/22/2023          O               This plan has been reviewed and created by the following participants:  This plan will be reviewed at least every 12 months. 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  She had a number of things she f/u on and wanted to share.  We d/e/p how Alette managed some difficult situations, was able to communication assertively, and maintained appropriate boundaries.     Home Practice:  Journal on the differences between connecting, intimacy and merging.      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

## 2022-12-28 MED ORDER — ATOMOXETINE HCL 80 MG PO CAPS: 80.0000 mg | ORAL_CAPSULE | Freq: Every day | ORAL | 1 refills | Status: DC

## 2022-12-28 NOTE — Addendum Note (Signed)
Addended by: Binnie Rail on: 12/28/2022 07:14 AM   Modules accepted: Orders

## 2023-01-01 ENCOUNTER — Other Ambulatory Visit: Payer: Self-pay | Admitting: Internal Medicine

## 2023-01-02 ENCOUNTER — Ambulatory Visit (INDEPENDENT_AMBULATORY_CARE_PROVIDER_SITE_OTHER): Payer: PRIVATE HEALTH INSURANCE | Admitting: Psychology

## 2023-01-02 DIAGNOSIS — F411 Generalized anxiety disorder: Secondary | ICD-10-CM | POA: Diagnosis not present

## 2023-01-02 NOTE — Progress Notes (Signed)
° ° ° ° ° ° ° ° ° ° ° ° ° ° °  Erica Rhinehart Ann Hevin Jeffcoat, PhD °

## 2023-01-02 NOTE — Progress Notes (Signed)
PROGRESS NOTE  Name: Erica [name] Date: 01/02/2023 MRN: [id number] DOB: [birth year] PCP: Erica Sanes, MD  Time spent: 3:00-3:55 PM  Today I met with  Erica [name] in remote video (Caregility) face-to-face individual psychotherapy.   Distance Site: Client's Home Orginating Site: Dr Erica [name] Remote Office Consent: Obtained verbal consent to transmit  session remotely    Reason for Visit /Presenting Problem: Erica [name] will be 42 this next week.  She has been treated for depression since she was a teenager.  Early on she learned skills for how to deal with her depression.  She came to [facility] in 2007 for graduate school.  In 2009 she sought treatment with Erica [name].  Following Erica [name]' retirement, she wasn't ready to start treatment again until now.   Individualized Treatment Plan                Strengths: intelligent, quick witted, funny, resourceful, people oriented, kind, adaptable  Supports: parents, 2 siblings, friends   Goal/Needs for Treatment:  In order of importance to patient 1) Learn and implement positive coping skills to decrease depression in order to feel less mentally exhausted. 2) Learn and implement positive coping skills to decrease anxiety in order to feel less mentally exhausted. 3) Process the trauma of the divorce and grieve the loss of the marriage    Client Statement of Needs: Pt states she needs "to be less mentally exhausted all the time and have energy to do things she wants to do."  Address the trauma of the divorce which she hasn't fully processed.  She wants to enjoy her life and worry less about all the things that aren't "right or haven't gone the way she expected."      Treatment Level:Weekly Outpatient Individual Psychotherapy  Symptoms: c/o that while depression is better managed, she has constant anxiety.  Hypervigilance, nervous, constantly worrying about everything, over thinking,   She has a "weird relationship with food."  She  doesn't feel hunger when she should because her stomach holds a lot of tension.  Often she won't eat until she feels faint or nauseous.  Sleep if discontinuous, initial sleep is good.  She sweats at night (not perimenopausal), has nightmares.  Struggles with fatigue, motivation, trouble getting started.  Client Treatment Preferences: Referred to this therapist by her previous therapist who retired the Physicist, medical consumer's goal for treatment:  Psychologist, Erica [name], [name].,  will support the patient's ability to achieve the goals identified. Cognitive Behavioral Therapy, Dialectical Behavioral Therapy, Motivational Interviewing, Behavior Activation, parenting skills and other evidenced-based practices will be used to promote progress towards healthy functioning.   [name] will: Actively participate in therapy, working towards healthy functioning.    *Justification for Continuation/Discontinuation of Goal: R=Revised, O=Ongoing, A=Achieved, D=Discontinued  Goal 1) Learn and implement coping skills to decrease depression in order to feel less mentally exhausted  5 Point Likert rating baseline date: Target Date Goal Was reviewed Status Code Progress towards goal/Likert rating  03/22/2023           O              Goal 2) Learn and implement positive coping skills to decrease anxiety in order to feel less mentally exhausted  5 Point Likert rating baseline date: Target Date Goal Was reviewed Status Code Progress towards goal/Likert rating  03/22/2023            O  Goal 3) Process the trauma of the divorce and grieve the loss of the marriage  5 Point Likert rating baseline date: Target Date Goal Was reviewed Status Code Progress towards goal/Likert rating  03/22/2023          O               This plan has been reviewed and created by the following participants:  This plan will be reviewed at least every 12 months. 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  She shared an incident that occurred with her mother.  We d/e/p that she was able to catch herself, regulated her emotions and then had the mental clarity to problem solve.  We also d/p that because she was able to regulate her emotions, her mother didn't "blow up" and she was able to get what she was hoping for.   Home Practice:  Journal on the differences between connecting, intimacy and merging.      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

## 2023-01-09 ENCOUNTER — Ambulatory Visit (INDEPENDENT_AMBULATORY_CARE_PROVIDER_SITE_OTHER): Payer: PRIVATE HEALTH INSURANCE | Admitting: Psychology

## 2023-01-09 DIAGNOSIS — F988 Other specified behavioral and emotional disorders with onset usually occurring in childhood and adolescence: Secondary | ICD-10-CM

## 2023-01-09 DIAGNOSIS — F331 Major depressive disorder, recurrent, moderate: Secondary | ICD-10-CM

## 2023-01-09 DIAGNOSIS — F411 Generalized anxiety disorder: Secondary | ICD-10-CM | POA: Diagnosis not present

## 2023-01-09 NOTE — Progress Notes (Signed)
PROGRESS NOTE  Name: Erica [name] Date: 01/09/2023 MRN: [id number] DOB: 09/06/1981 PCP: [name], MD  Time spent: 3:00-3:55 PM  Today I met with  Erica [name] in remote video (Caregility) face-to-face individual psychotherapy.   Distance Site: Client's Home Orginating Site: Dr Odette Horns Remote Office Consent: Obtained verbal consent to transmit  session remotely    Reason for Visit /Presenting Problem: Erica [name] will be 42 this next week.  She has been treated for depression since she was a teenager.  Early on she learned skills for how to deal with her depression.  She came to [facility] in 2007 for graduate school.  In 2009 she sought treatment with [name].  Following [name]' retirement, she wasn't ready to start treatment again until now.   Individualized Treatment Plan                Strengths: intelligent, quick witted, funny, resourceful, people oriented, kind, adaptable  Supports: parents, 2 siblings, friends   Goal/Needs for Treatment:  In order of importance to patient 1) Learn and implement positive coping skills to decrease depression in order to feel less mentally exhausted. 2) Learn and implement positive coping skills to decrease anxiety in order to feel less mentally exhausted. 3) Process the trauma of the divorce and grieve the loss of the marriage    Client Statement of Needs: Pt states she needs "to be less mentally exhausted all the time and have energy to do things she wants to do."  Address the trauma of the divorce which she hasn't fully processed.  She wants to enjoy her life and worry less about all the things that aren't "right or haven't gone the way she expected."      Treatment Level:Weekly Outpatient Individual Psychotherapy  Symptoms: c/o that while depression is better managed, she has constant anxiety.  Hypervigilance, nervous, constantly worrying about everything, over thinking,   She has a "weird relationship with food."  She  doesn't feel hunger when she should because her stomach holds a lot of tension.  Often she won't eat until she feels faint or nauseous.  Sleep if discontinuous, initial sleep is good.  She sweats at night (not perimenopausal), has nightmares.  Struggles with fatigue, motivation, trouble getting started.  Client Treatment Preferences: Referred to this therapist by her previous therapist who retired the Physicist, medical consumer's goal for treatment:  Psychologist, [name], [name].,  will support the patient's ability to achieve the goals identified. Cognitive Behavioral Therapy, Dialectical Behavioral Therapy, Motivational Interviewing, Behavior Activation, parenting skills and other evidenced-based practices will be used to promote progress towards healthy functioning.   [name] will: Actively participate in therapy, working towards healthy functioning.    *Justification for Continuation/Discontinuation of Goal: R=Revised, O=Ongoing, A=Achieved, D=Discontinued  Goal 1) Learn and implement coping skills to decrease depression in order to feel less mentally exhausted  5 Point Likert rating baseline date: Target Date Goal Was reviewed Status Code Progress towards goal/Likert rating  03/22/2023           O              Goal 2) Learn and implement positive coping skills to decrease anxiety in order to feel less mentally exhausted  5 Point Likert rating baseline date: Target Date Goal Was reviewed Status Code Progress towards goal/Likert rating  03/22/2023            O  Goal 3) Process the trauma of the divorce and grieve the loss of the marriage  5 Point Likert rating baseline date: Target Date Goal Was reviewed Status Code Progress towards goal/Likert rating  03/22/2023          O               This plan has been reviewed and created by the following participants:  This plan will be reviewed at least every 12 months. 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  Mediation is set for tomorrow morning.  In session, she shared that she was frightened of her ex-husband.  Together we addressed her anxieties, reality tested her fears and created some positive coping statements she could use to shore herself up if necessary.  Erica [name] shared that she had something big happened between herself and the man she has been talking to.  She states that she was able to calm herself, identify her emotions and talked to him without emotional reactivity.  We d/p what a huge sign of growth that was for her.   We also d/e/p how she is planning to manage her boundaries and what she needed to know going forward should she decide to continue to talk to him.    Lastly, pt was reminded of my upcoming vacation and we confirmed her next appointment.   Home Practice:  Journal on the differences between connecting, intimacy and merging.      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

## 2023-01-16 ENCOUNTER — Ambulatory Visit: Payer: Commercial Managed Care - HMO | Admitting: Psychology

## 2023-01-23 ENCOUNTER — Ambulatory Visit: Payer: Commercial Managed Care - HMO | Admitting: Psychology

## 2023-01-29 ENCOUNTER — Other Ambulatory Visit: Payer: Self-pay | Admitting: Internal Medicine

## 2023-01-30 ENCOUNTER — Ambulatory Visit: Payer: Commercial Managed Care - HMO | Admitting: Psychology

## 2023-01-31 NOTE — Progress Notes (Unsigned)
  [name] [address] [place] [address] Phone: [phone] Subjective:   INadine Counts, am serving as a scribe for Dr. Antoine Primas.  I'm seeing this patient by the request  of:  [name], MD  CC: back and hip pain   [name]  Lewellyn Standley Erica [name] is a 42 y.o. female coming in with complaint of back and neck pain. OMT 11/04/2022. Also f/u for L hip pain. Patient states doing well. No new concerns.  Medications patient has been prescribed: None  Taking:         Reviewed prior external information including notes and imaging from previsou exam, outside providers and external EMR if available.   As well as notes that were available from care everywhere and other healthcare systems.  Past medical history, social, surgical and family history all reviewed in electronic medical record.  No pertanent information unless stated regarding to the chief complaint.   Past Medical History:  Diagnosis Date   Allergy    Anxiety    Depression    STD (sexually transmitted disease)    chlamydia treated 2023    No Known Allergies   Review of Systems:  No headache, visual changes, nausea, vomiting, diarrhea, constipation, dizziness, abdominal pain, skin rash, fevers, chills, night sweats, weight loss, swollen lymph nodes, body aches, joint swelling, chest pain, shortness of breath, mood changes. POSITIVE muscle aches  Objective  Blood pressure 124/82, height 5\' 7"  (1.702 m), weight 205 lb (93 kg).   General: No apparent distress alert and oriented x3 mood and affect normal, dressed appropriately.  HEENT: Pupils equal, extraocular movements intact  Respiratory: Patient's speak in full sentences and does not appear short of breath  Cardiovascular: No lower extremity edema, non tender, no erythema  Tenderness over the paraspinal musculature of the lumbar spine.  Patient does have tightness with Pearlean Brownie on the left side compared to the right  side.  Still has some tightness in the paraspinal musculature as well.  Osteopathic findings  C6 flexed rotated and side bent left T9 extended rotated and side bent left L2 flexed rotated and side bent right Sacrum right on right       Assessment and Plan:  Left hip pain Has known labral pathology but doing very well with conservative therapy.  Patient did not have any room difficulty today and did have a relatively good range of motion.  Discussed different medications again if necessary.  Increase activity slowly otherwise.  Follow-up again in 6 to 8 weeks otherwise.    Nonallopathic problems  Decision today to treat with OMT was based on Physical Exam  After verbal consent patient was treated with HVLA, ME, FPR techniques in cervical, , thoracic, lumbar, and sacral  areas  Patient tolerated the procedure well with improvement in symptoms  Patient given exercises, stretches and lifestyle modifications  See medications in patient instructions if given  Patient will follow up in12 weeks    The above documentation has been reviewed and is accurate and complete Judi Saa, DO          Note: This dictation was prepared with Dragon dictation along with smaller phrase technology. Any transcriptional errors that result from this process are unintentional.

## 2023-02-01 ENCOUNTER — Encounter: Payer: Self-pay | Admitting: Family Medicine

## 2023-02-01 ENCOUNTER — Ambulatory Visit (INDEPENDENT_AMBULATORY_CARE_PROVIDER_SITE_OTHER): Payer: 59 | Admitting: Family Medicine

## 2023-02-01 VITALS — BP 124/82 | Ht 67.0 in | Wt 205.0 lb

## 2023-02-01 DIAGNOSIS — M9902 Segmental and somatic dysfunction of thoracic region: Secondary | ICD-10-CM

## 2023-02-01 DIAGNOSIS — M9903 Segmental and somatic dysfunction of lumbar region: Secondary | ICD-10-CM

## 2023-02-01 DIAGNOSIS — M9904 Segmental and somatic dysfunction of sacral region: Secondary | ICD-10-CM

## 2023-02-01 DIAGNOSIS — M25552 Pain in left hip: Secondary | ICD-10-CM

## 2023-02-01 DIAGNOSIS — M9901 Segmental and somatic dysfunction of cervical region: Secondary | ICD-10-CM

## 2023-02-01 NOTE — Patient Instructions (Signed)
Good to see you Nothing to add Enjoy the camps See me in 3-4 weeks

## 2023-02-01 NOTE — Assessment & Plan Note (Signed)
Has known labral pathology but doing very well with conservative therapy.  Patient did not have any room difficulty today and did have a relatively good range of motion.  Discussed different medications again if necessary.  Increase activity slowly otherwise.  Follow-up again in 6 to 8 weeks otherwise.

## 2023-02-05 ENCOUNTER — Other Ambulatory Visit: Payer: Self-pay | Admitting: Internal Medicine

## 2023-02-06 ENCOUNTER — Ambulatory Visit (INDEPENDENT_AMBULATORY_CARE_PROVIDER_SITE_OTHER): Payer: PRIVATE HEALTH INSURANCE | Admitting: Psychology

## 2023-02-06 DIAGNOSIS — F411 Generalized anxiety disorder: Secondary | ICD-10-CM | POA: Diagnosis not present

## 2023-02-13 ENCOUNTER — Ambulatory Visit (INDEPENDENT_AMBULATORY_CARE_PROVIDER_SITE_OTHER): Payer: PRIVATE HEALTH INSURANCE | Admitting: Psychology

## 2023-02-13 DIAGNOSIS — F411 Generalized anxiety disorder: Secondary | ICD-10-CM

## 2023-02-13 NOTE — Progress Notes (Signed)
PROGRESS NOTE  Name: Erica [name] Date: 02/13/2023 MRN: [id number] DOB: [birth year] PCP: Erica Sanes, MD  Time spent: 3:00-3:55 PM  Today I met with  Erica [name] in remote video (Caregility) face-to-face individual psychotherapy.   Distance Site: Client's Home Orginating Site: Dr Odette Horns Remote Office Consent: Obtained verbal consent to transmit session remotely    Reason for Visit /Presenting Problem: Erica [name] will be 42 this next week.  She has been treated for depression since she was a teenager.  Early on she learned skills for how to deal with her depression.  She came to [facility] in 2007 for graduate school.  In 2009 she sought treatment with Erica [name].  Following Erica [name]' retirement, she wasn't ready to start treatment again until now.   Individualized Treatment Plan                Strengths: intelligent, quick witted, funny, resourceful, people oriented, kind, adaptable  Supports: parents, 2 siblings, friends   Goal/Needs for Treatment:  In order of importance to patient 1) Learn and implement positive coping skills to decrease depression in order to feel less mentally exhausted. 2) Learn and implement positive coping skills to decrease anxiety in order to feel less mentally exhausted. 3) Process the trauma of the divorce and grieve the loss of the marriage    Client Statement of Needs: Pt states she needs "to be less mentally exhausted all the time and have energy to do things she wants to do."  Address the trauma of the divorce which she hasn't fully processed.  She wants to enjoy her life and worry less about all the things that aren't "right or haven't gone the way she expected."      Treatment Level:Weekly Outpatient Individual Psychotherapy  Symptoms: c/o that while depression is better managed, she has constant anxiety.  Hypervigilance, nervous, constantly worrying about everything, over thinking,   She has a "weird relationship with food."  She  doesn't feel hunger when she should because her stomach holds a lot of tension.  Often she won't eat until she feels faint or nauseous.  Sleep if discontinuous, initial sleep is good.  She sweats at night (not perimenopausal), has nightmares.  Struggles with fatigue, motivation, trouble getting started.  Client Treatment Preferences: Referred to this therapist by her previous therapist who retired the Physicist, medical consumer's goal for treatment:  Psychologist, Erica [name], [name].,  will support the patient's ability to achieve the goals identified. Cognitive Behavioral Therapy, Dialectical Behavioral Therapy, Motivational Interviewing, Behavior Activation, parenting skills and other evidenced-based practices will be used to promote progress towards healthy functioning.   [name] will: Actively participate in therapy, working towards healthy functioning.    *Justification for Continuation/Discontinuation of Goal: R=Revised, O=Ongoing, A=Achieved, D=Discontinued  Goal 1) Learn and implement coping skills to decrease depression in order to feel less mentally exhausted  5 Point Likert rating baseline date: Target Date Goal Was reviewed Status Code Progress towards goal/Likert rating  03/22/2023           O              Goal 2) Learn and implement positive coping skills to decrease anxiety in order to feel less mentally exhausted  5 Point Likert rating baseline date: Target Date Goal Was reviewed Status Code Progress towards goal/Likert rating  03/22/2023            O  Goal 3) Process the trauma of the divorce and grieve the loss of the marriage  5 Point Likert rating baseline date: Target Date Goal Was reviewed Status Code Progress towards goal/Likert rating  03/22/2023          O               This plan has been reviewed and created by the following participants:  This plan will be reviewed at least every 12 months. 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  We d/e/p how this was reactionary to serving him, and that while she was initially triggered, she was able to respond differently and didn't let him spoil her weekend.  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  Erica [name] also stated that she was invited to join a book group.  We d/e that it's possible to connect with new people and how this makes it easier to let go of relationships/friendships that aren't working for her.     Home Practice:  Journal on the differences between connecting, intimacy and merging.      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

## 2023-02-13 NOTE — Progress Notes (Signed)
PROGRESS NOTE  Name: Erica [name] Date: 02/13/2023 MRN: [id number] DOB: 12/23/1980 PCP: [name], MD  Time spent: 3:00-3:55 PM  Today I met with  Erica [name] in remote video (Caregility) face-to-face individual psychotherapy.   Distance Site: Client's Home Orginating Site: Dr Odette Horns Remote Office Consent: Obtained verbal consent to transmit session remotely    Reason for Visit /Presenting Problem: Erica [name] will be 42 this next week.  She has been treated for depression since she was a teenager.  Early on she learned skills for how to deal with her depression.  She came to [facility] in 2007 for graduate school.  In 2009 she sought treatment with [name].  Following [name]' retirement, she wasn't ready to start treatment again until now.   Individualized Treatment Plan                Strengths: intelligent, quick witted, funny, resourceful, people oriented, kind, adaptable  Supports: parents, 2 siblings, friends   Goal/Needs for Treatment:  In order of importance to patient 1) Learn and implement positive coping skills to decrease depression in order to feel less mentally exhausted. 2) Learn and implement positive coping skills to decrease anxiety in order to feel less mentally exhausted. 3) Process the trauma of the divorce and grieve the loss of the marriage    Client Statement of Needs: Pt states she needs "to be less mentally exhausted all the time and have energy to do things she wants to do."  Address the trauma of the divorce which she hasn't fully processed.  She wants to enjoy her life and worry less about all the things that aren't "right or haven't gone the way she expected."      Treatment Level:Weekly Outpatient Individual Psychotherapy  Symptoms: c/o that while depression is better managed, she has constant anxiety.  Hypervigilance, nervous, constantly worrying about everything, over thinking,   She has a "weird relationship with food."  She  doesn't feel hunger when she should because her stomach holds a lot of tension.  Often she won't eat until she feels faint or nauseous.  Sleep if discontinuous, initial sleep is good.  She sweats at night (not perimenopausal), has nightmares.  Struggles with fatigue, motivation, trouble getting started.  Client Treatment Preferences: Referred to this therapist by her previous therapist who retired the Physicist, medical consumer's goal for treatment:  Psychologist, [name], [name].,  will support the patient's ability to achieve the goals identified. Cognitive Behavioral Therapy, Dialectical Behavioral Therapy, Motivational Interviewing, Behavior Activation, parenting skills and other evidenced-based practices will be used to promote progress towards healthy functioning.   [name] will: Actively participate in therapy, working towards healthy functioning.    *Justification for Continuation/Discontinuation of Goal: R=Revised, O=Ongoing, A=Achieved, D=Discontinued  Goal 1) Learn and implement coping skills to decrease depression in order to feel less mentally exhausted  5 Point Likert rating baseline date: Target Date Goal Was reviewed Status Code Progress towards goal/Likert rating  03/22/2023           O              Goal 2) Learn and implement positive coping skills to decrease anxiety in order to feel less mentally exhausted  5 Point Likert rating baseline date: Target Date Goal Was reviewed Status Code Progress towards goal/Likert rating  03/22/2023            O  Goal 3) Process the trauma of the divorce and grieve the loss of the marriage  5 Point Likert rating baseline date: Target Date Goal Was reviewed Status Code Progress towards goal/Likert rating  03/22/2023          O               This plan has been reviewed and created by the following participants:  This plan will be reviewed at least every 12 months. 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  So far, he has been respectful of these boundaries.  We d/e/p anticipated challenges and that she is cautiously hopeful.  Lastly, we d/ that there hasn't been some interesting dynamics/ manipulations playing out since she served her ex-husband papers.  We d/p what occurred in mediation and how he managed to undo any progress they made.  I provided the support and guidance needed.  Home Practice:  Journal on the differences between connecting, intimacy and merging.      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

## 2023-02-27 ENCOUNTER — Ambulatory Visit (INDEPENDENT_AMBULATORY_CARE_PROVIDER_SITE_OTHER): Payer: PRIVATE HEALTH INSURANCE | Admitting: Psychology

## 2023-02-27 DIAGNOSIS — F411 Generalized anxiety disorder: Secondary | ICD-10-CM | POA: Diagnosis not present

## 2023-02-27 NOTE — Progress Notes (Signed)
PROGRESS NOTE  Name: Erica [name] Date: 02/27/2023 MRN: [id number] DOB: [birth year] PCP: [name], MD  Time spent: 3:00-3:55 PM  Today I met with  Erica [name] in remote video (Caregility) face-to-face individual psychotherapy.   Distance Site: Client's Home Orginating Site: Dr Odette Horns Remote Office Consent: Obtained verbal consent to transmit session remotely    Reason for Visit /Presenting Problem: Erica [name].  She has been treated for depression since she was a teenager.  Early on she learned skills for how to deal with her depression.  She came to [facility] in 2007 for graduate school.  In 2009 she sought treatment with [name].  Following [name]' retirement, she wasn't ready to start treatment again until now.   Individualized Treatment Plan                Strengths: intelligent, quick witted, funny, resourceful, people oriented, kind, adaptable  Supports: parents, 2 siblings, friends   Goal/Needs for Treatment:  In order of importance to patient 1) Learn and implement positive coping skills to decrease depression in order to feel less mentally exhausted. 2) Learn and implement positive coping skills to decrease anxiety in order to feel less mentally exhausted. 3) Process the trauma of the divorce and grieve the loss of the marriage    Client Statement of Needs: Pt states she needs "to be less mentally exhausted all the time and have energy to do things she wants to do."  Address the trauma of the divorce which she hasn't fully processed.  She wants to enjoy her life and worry less about all the things that aren't "right or haven't gone the way she expected."      Treatment Level:Weekly Outpatient Individual Psychotherapy  Symptoms: c/o that while depression is better managed, she has constant anxiety.  Hypervigilance, nervous, constantly worrying about everything, over thinking,   She has a "weird relationship with food."  She  doesn't feel hunger when she should because her stomach holds a lot of tension.  Often she won't eat until she feels faint or nauseous.  Sleep if discontinuous, initial sleep is good.  She sweats at night (not perimenopausal), has nightmares.  Struggles with fatigue, motivation, trouble getting started.  Client Treatment Preferences: Referred to this therapist by her previous therapist who retired the Physicist, medical consumer's goal for treatment:  Psychologist, Erica [name], [name].,  will support the patient's ability to achieve the goals identified. Cognitive Behavioral Therapy, Dialectical Behavioral Therapy, Motivational Interviewing, Behavior Activation, parenting skills and other evidenced-based practices will be used to promote progress towards healthy functioning.   [name] will: Actively participate in therapy, working towards healthy functioning.    *Justification for Continuation/Discontinuation of Goal: R=Revised, O=Ongoing, A=Achieved, D=Discontinued  Goal 1) Learn and implement coping skills to decrease depression in order to feel less mentally exhausted  5 Point Likert rating baseline date: Target Date Goal Was reviewed Status Code Progress towards goal/Likert rating  03/22/2023           O              Goal 2) Learn and implement positive coping skills to decrease anxiety in order to feel less mentally exhausted  5 Point Likert rating baseline date: Target Date Goal Was reviewed Status Code Progress towards goal/Likert rating  03/22/2023            O  Goal 3) Process the trauma of the divorce and grieve the loss of the marriage  5 Point Likert rating baseline date: Target Date Goal Was reviewed Status Code Progress towards goal/Likert rating  03/22/2023          O               This plan has been reviewed and created by the following participants:  This plan will be reviewed at least every 12 months. 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  However, there are also some stressful aspects to the project that she has had to p/s and has been able to do so successfully.  We d/ that things continue to go well with Erica [name].  They have been a great support for each other especially going into summer vacation with the kids at home.  The one area that has been stressful is coming to an agreement with her ex-husband who is attempting to drag things out and interfere wherever possible.  Erica [name] wanted to be sure she was responding appropriately to her to an email her ex-husband sent and she requested that we review it together in session.  I was able to provide the support Erica [name] needed, highlight her areas of growth and offered the additional guidance needed.   Home Practice:  Journal on the differences between connecting, intimacy and merging.      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

## 2023-03-06 ENCOUNTER — Ambulatory Visit (INDEPENDENT_AMBULATORY_CARE_PROVIDER_SITE_OTHER): Payer: PRIVATE HEALTH INSURANCE | Admitting: Psychology

## 2023-03-06 DIAGNOSIS — F411 Generalized anxiety disorder: Secondary | ICD-10-CM | POA: Diagnosis not present

## 2023-03-06 NOTE — Progress Notes (Signed)
PROGRESS NOTE  Name: Erica [name] Date: 03/06/2023 MRN: [id number] DOB: [birth year] PCP: Erica Sanes, MD  Time spent: 3:00-3:55 PM  Today I met with  Erica [name] in remote video (Caregility) face-to-face individual psychotherapy.   Distance Site: Client's Home Orginating Site: Dr Erica [name] Remote Office Consent: Obtained verbal consent to transmit session remotely    Reason for Visit /Presenting Problem: Erica [name] will be 42 this next week.  She has been treated for depression since she was a teenager.  Early on she learned skills for how to deal with her depression.  She came to [facility] in 2007 for graduate school.  In 2009 she sought treatment with Erica [name].  Following Erica [name]' retirement, she wasn't ready to start treatment again until now.   Individualized Treatment Plan                Strengths: intelligent, quick witted, funny, resourceful, people oriented, kind, adaptable  Supports: parents, 2 siblings, friends   Goal/Needs for Treatment:  In order of importance to patient 1) Learn and implement positive coping skills to decrease depression in order to feel less mentally exhausted. 2) Learn and implement positive coping skills to decrease anxiety in order to feel less mentally exhausted. 3) Process the trauma of the divorce and grieve the loss of the marriage    Client Statement of Needs: Pt states she needs "to be less mentally exhausted all the time and have energy to do things she wants to do."  Address the trauma of the divorce which she hasn't fully processed.  She wants to enjoy her life and worry less about all the things that aren't "right or haven't gone the way she expected."      Treatment Level:Weekly Outpatient Individual Psychotherapy  Symptoms: c/o that while depression is better managed, she has constant anxiety.  Hypervigilance, nervous, constantly worrying about everything, over thinking,   She has a "weird relationship with food."  She  doesn't feel hunger when she should because her stomach holds a lot of tension.  Often she won't eat until she feels faint or nauseous.  Sleep if discontinuous, initial sleep is good.  She sweats at night (not perimenopausal), has nightmares.  Struggles with fatigue, motivation, trouble getting started.  Client Treatment Preferences: Referred to this therapist by her previous therapist who retired the Physicist, medical consumer's goal for treatment:  Psychologist, Erica [name], [name].,  will support the patient's ability to achieve the goals identified. Cognitive Behavioral Therapy, Dialectical Behavioral Therapy, Motivational Interviewing, Behavior Activation, parenting skills and other evidenced-based practices will be used to promote progress towards healthy functioning.   [name] will: Actively participate in therapy, working towards healthy functioning.    *Justification for Continuation/Discontinuation of Goal: R=Revised, O=Ongoing, A=Achieved, D=Discontinued  Goal 1) Learn and implement coping skills to decrease depression in order to feel less mentally exhausted  5 Point Likert rating baseline date: Target Date Goal Was reviewed Status Code Progress towards goal/Likert rating  03/22/2023           O              Goal 2) Learn and implement positive coping skills to decrease anxiety in order to feel less mentally exhausted  5 Point Likert rating baseline date: Target Date Goal Was reviewed Status Code Progress towards goal/Likert rating  03/22/2023            O  Goal 3) Process the trauma of the divorce and grieve the loss of the marriage  5 Point Likert rating baseline date: Target Date Goal Was reviewed Status Code Progress towards goal/Likert rating  03/22/2023          O               This plan has been reviewed and created by the following participants:  This plan will be reviewed at least every 12 months. 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  We d/e/p how they each responded, implications and next steps.  Erica [name] states that her daughter "out of the blue" announced that she wanted to eat healthier and by that she meant be a vegetarian.  Erica [name]'s father is a vegetarian and Erica [name] suspects he "put this idea in her head."  I noted that she was making an assumption.  I used CBT and DBT techniques to get her to challenge her negative assumptions, and respond in a less reactive manner.  We practiced an emotion regulation technique to help her re-center and begin to p/s from a calmer place.  We had to repeat rounds of regulating behaviors over the course of the session.  I focused on a major motivation factor, getting Erica [name] to communicate openly with her, to get Erica [name] to be more invested in dealing with her daughter like she would around any other issue.  Erica [name] agreed to do some research and provide the information Erica [name] needs to make a good decision.  I encouraged her to enlist the support of Erica [name]'s pediatrician and a nutritionist.   Home Practice:  Journal on the differences between connecting, intimacy and merging.      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

## 2023-03-13 ENCOUNTER — Ambulatory Visit (INDEPENDENT_AMBULATORY_CARE_PROVIDER_SITE_OTHER): Payer: PRIVATE HEALTH INSURANCE | Admitting: Psychology

## 2023-03-13 DIAGNOSIS — F411 Generalized anxiety disorder: Secondary | ICD-10-CM | POA: Diagnosis not present
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
## 2023-03-20 ENCOUNTER — Ambulatory Visit: Payer: PRIVATE HEALTH INSURANCE | Admitting: Psychology

## 2023-03-20 DIAGNOSIS — F411 Generalized anxiety disorder: Secondary | ICD-10-CM

## 2023-03-20 NOTE — Progress Notes (Signed)
PROGRESS NOTE  Name: Erica [name] Date: 03/20/2023 MRN: [id number] DOB: Apr 14, 1981 PCP: [name], MD  Time spent: 3:00-3:58 PM  Today I met with  Erica [name] in remote video (Caregility) face-to-face individual psychotherapy.   Distance Site: Client's Home Orginating Site: Dr Odette Horns Remote Office Consent: Obtained verbal consent to transmit session remotely.  Patient is aware of the limitations related to participating in virtual therapy.    Reason for Visit /Presenting Problem: Erica [name] will be 42 this next week.  She has been treated for depression since she was a teenager.  Early on she learned skills for how to deal with her depression.  She came to [facility] in 2007 for graduate school.  In 2009 she sought treatment with [name].  Following [name]' retirement, she wasn't ready to start treatment again until now.   Individualized Treatment Plan                Strengths: intelligent, quick witted, funny, resourceful, people oriented, kind, adaptable  Supports: parents, 2 siblings, friends   Goal/Needs for Treatment:  In order of importance to patient 1) Learn and implement positive coping skills to decrease depression in order to feel less mentally exhausted. 2) Learn and implement positive coping skills to decrease anxiety in order to feel less mentally exhausted. 3) Process the trauma of the divorce and grieve the loss of the marriage    Client Statement of Needs: Pt states she needs "to be less mentally exhausted all the time and have energy to do things she wants to do."  Address the trauma of the divorce which she hasn't fully processed.  She wants to enjoy her life and worry less about all the things that aren't "right or haven't gone the way she expected."      Treatment Level:Weekly Outpatient Individual Psychotherapy  Symptoms: c/o that while depression is better managed, she has constant anxiety.  Hypervigilance, nervous, constantly worrying about  everything, over thinking,   She has a "weird relationship with food."  She doesn't feel hunger when she should because her stomach holds a lot of tension.  Often she won't eat until she feels faint or nauseous.  Sleep if discontinuous, initial sleep is good.  She sweats at night (not perimenopausal), has nightmares.  Struggles with fatigue, motivation, trouble getting started.  Client Treatment Preferences: Referred to this therapist by her previous therapist who retired the Physicist, medical consumer's goal for treatment:  Psychologist, Erica [name], [name].,  will support the patient's ability to achieve the goals identified. Cognitive Behavioral Therapy, Dialectical Behavioral Therapy, Motivational Interviewing, Behavior Activation, parenting skills and other evidenced-based practices will be used to promote progress towards healthy functioning.   Healthcare consumer Joleena Dube will: Actively participate in therapy, working towards healthy functioning.    *Justification for Continuation/Discontinuation of Goal: R=Revised, O=Ongoing, A=Achieved, D=Discontinued  Goal 1) Learn and implement coping skills to decrease depression in order to feel less mentally exhausted  5 Point Likert rating baseline date: Target Date Goal Was reviewed Status Code Progress towards goal/Likert rating  03/22/2023           O              Goal 2) Learn and implement positive coping skills to decrease anxiety in order to feel less mentally exhausted  5 Point Likert rating baseline date: Target Date Goal Was reviewed Status Code Progress towards goal/Likert rating  03/22/2023  O              Goal 3) Process the trauma of the divorce and grieve the loss of the marriage  5 Point Likert rating baseline date: Target Date Goal Was reviewed Status Code Progress towards goal/Likert rating  03/22/2023          O               This plan has been reviewed and created by the following  participants:  This plan will be reviewed at least every 12 months. 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  She is looking forward to seeing them but is also anxious.  We d/e/p where things have been recently between her and her parents, where they have made progress and where she has made progress.  We made some connections between the past and the present as we d/ some of her family history.  Lastly, I introduced the topic of co-regulation and co-dysregulation.  I noted that she had it in her power to manage her emotional reactions, but that could also help co-regulate them should they get triggered and keep the situation from escalating quickly.   Home Practice:  Journal on the differences between connecting, intimacy and merging.      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

## 2023-03-26 ENCOUNTER — Other Ambulatory Visit: Payer: Self-pay | Admitting: Internal Medicine

## 2023-03-27 ENCOUNTER — Ambulatory Visit (INDEPENDENT_AMBULATORY_CARE_PROVIDER_SITE_OTHER): Payer: PRIVATE HEALTH INSURANCE | Admitting: Psychology

## 2023-03-27 DIAGNOSIS — F411 Generalized anxiety disorder: Secondary | ICD-10-CM

## 2023-03-27 DIAGNOSIS — F331 Major depressive disorder, recurrent, moderate: Secondary | ICD-10-CM

## 2023-03-27 DIAGNOSIS — F988 Other specified behavioral and emotional disorders with onset usually occurring in childhood and adolescence: Secondary | ICD-10-CM

## 2023-03-27 DIAGNOSIS — F902 Attention-deficit hyperactivity disorder, combined type: Secondary | ICD-10-CM
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
## 2023-04-01 ENCOUNTER — Other Ambulatory Visit: Payer: Self-pay | Admitting: Internal Medicine

## 2023-04-03 ENCOUNTER — Ambulatory Visit: Payer: PRIVATE HEALTH INSURANCE | Admitting: Psychology

## 2023-04-10 ENCOUNTER — Ambulatory Visit: Payer: PRIVATE HEALTH INSURANCE | Admitting: Psychology

## 2023-04-10 DIAGNOSIS — F411 Generalized anxiety disorder: Secondary | ICD-10-CM

## 2023-04-10 DIAGNOSIS — F900 Attention-deficit hyperactivity disorder, predominantly inattentive type: Secondary | ICD-10-CM

## 2023-04-10 DIAGNOSIS — F331 Major depressive disorder, recurrent, moderate: Secondary | ICD-10-CM | POA: Diagnosis not present
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
## 2023-04-11 ENCOUNTER — Ambulatory Visit: Payer: 59 | Admitting: Internal Medicine

## 2023-04-17 ENCOUNTER — Ambulatory Visit (INDEPENDENT_AMBULATORY_CARE_PROVIDER_SITE_OTHER): Payer: PRIVATE HEALTH INSURANCE | Admitting: Psychology

## 2023-04-17 DIAGNOSIS — F331 Major depressive disorder, recurrent, moderate: Secondary | ICD-10-CM | POA: Diagnosis not present

## 2023-04-17 DIAGNOSIS — F411 Generalized anxiety disorder: Secondary | ICD-10-CM | POA: Diagnosis not present

## 2023-04-17 DIAGNOSIS — F900 Attention-deficit hyperactivity disorder, predominantly inattentive type: Secondary | ICD-10-CM
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
## 2023-04-24 ENCOUNTER — Ambulatory Visit (INDEPENDENT_AMBULATORY_CARE_PROVIDER_SITE_OTHER): Payer: PRIVATE HEALTH INSURANCE | Admitting: Psychology

## 2023-04-24 ENCOUNTER — Ambulatory Visit: Payer: Self-pay | Admitting: Internal Medicine

## 2023-04-24 DIAGNOSIS — F331 Major depressive disorder, recurrent, moderate: Secondary | ICD-10-CM

## 2023-04-24 DIAGNOSIS — F900 Attention-deficit hyperactivity disorder, predominantly inattentive type: Secondary | ICD-10-CM

## 2023-04-24 DIAGNOSIS — F411 Generalized anxiety disorder: Secondary | ICD-10-CM | POA: Diagnosis not present
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
## 2023-05-01 ENCOUNTER — Ambulatory Visit: Payer: PRIVATE HEALTH INSURANCE | Admitting: Psychology

## 2023-05-01 DIAGNOSIS — F900 Attention-deficit hyperactivity disorder, predominantly inattentive type: Secondary | ICD-10-CM | POA: Diagnosis not present

## 2023-05-01 DIAGNOSIS — F411 Generalized anxiety disorder: Secondary | ICD-10-CM | POA: Diagnosis not present

## 2023-05-01 DIAGNOSIS — F331 Major depressive disorder, recurrent, moderate: Secondary | ICD-10-CM
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
## 2023-05-04 ENCOUNTER — Encounter: Payer: Self-pay | Admitting: Internal Medicine

## 2023-05-04 NOTE — Progress Notes (Signed)
      Subjective:    Patient ID: Erica [name], female    DOB: Sep 30, 1980, 42 y.o.   MRN: [id number]     HPI Baylie is here for follow up of her chronic medical problems.  Everything is going well - still seeing therapist.  Feels medications are working well.    Medications and allergies reviewed with patient and updated if appropriate.  Current Outpatient Medications on File Prior to Visit  Medication Sig Dispense Refill   ALPRAZolam (XANAX) 0.5 MG tablet TAKE 1 TABLET 3 TIMES DAILY AS NEEDED FOR ANXIETY. FOLLOW UP NEEDED FOR ADDITIONAL REFILLS 45 tablet 0   atomoxetine (STRATTERA) 80 MG capsule Take 1 capsule (80 mg total) by mouth daily. 90 capsule 1   cyclobenzaprine (FLEXERIL) 10 MG tablet TAKE 1 TABLET(10 MG) BY MOUTH THREE TIMES DAILY AS NEEDED FOR MUSCLE SPASMS 30 tablet 3   DULoxetine (CYMBALTA) 60 MG capsule TAKE 1 CAPSULE BY MOUTH EVERY DAY 90 capsule 0   meloxicam (MOBIC) 15 MG tablet TAKE 1 TABLET BY MOUTH EVERY DAY 30 tablet 1   No current facility-administered medications on file prior to visit.     Review of Systems  Respiratory:  Negative for shortness of breath.   Cardiovascular:  Negative for chest pain and palpitations.  Neurological:  Negative for dizziness, light-headedness and headaches.  Psychiatric/Behavioral:  Negative for sleep disturbance.        Objective:   Vitals:   05/05/23 1419  BP: 126/78  Pulse: 75  Temp: 98.2 F (36.8 C)  SpO2: 95%   BP Readings from Last 3 Encounters:  05/05/23 126/78  02/01/23 124/82  11/04/22 126/84   Wt Readings from Last 3 Encounters:  05/05/23 202 lb (91.6 kg)  02/01/23 205 lb (93 kg)  11/04/22 213 lb (96.6 kg)   Body mass index is 31.64 kg/m.    Physical Exam Constitutional:      General: She is not in acute distress.    Appearance: Normal appearance. She is not ill-appearing.  HENT:     Head: Normocephalic and atraumatic.  Skin:    General: Skin is warm and dry.  Neurological:      Mental Status: She is alert. Mental status is at baseline.  Psychiatric:        Mood and Affect: Mood normal.        Behavior: Behavior normal.        Thought Content: Thought content normal.        Judgment: Judgment normal.        Lab Results  Component Value Date   WBC 6.6 03/21/2022   HGB 13.3 03/21/2022   HCT 40.0 03/21/2022   PLT 268.0 03/21/2022   GLUCOSE 97 03/21/2022   CHOL 215 (H) 03/21/2022   TRIG 127.0 03/21/2022   HDL 43.20 03/21/2022   LDLDIRECT 154.6 01/25/2013   LDLCALC 147 (H) 03/21/2022   ALT 17 03/21/2022   AST 19 03/21/2022   NA 139 03/21/2022   K 4.0 03/21/2022   CL 105 03/21/2022   CREATININE 0.82 03/21/2022   BUN 13 03/21/2022   CO2 28 03/21/2022   TSH 2.12 03/21/2022     Assessment & Plan:    See Problem List for Assessment and Plan of chronic medical problems.

## 2023-05-04 NOTE — Patient Instructions (Addendum)
    Mammogram ordered.    Medications changes include :   none     Return in about 6 months (around 11/05/2023).

## 2023-05-05 ENCOUNTER — Ambulatory Visit (INDEPENDENT_AMBULATORY_CARE_PROVIDER_SITE_OTHER): Payer: Self-pay | Admitting: Internal Medicine

## 2023-05-05 VITALS — BP 126/78 | HR 75 | Temp 98.2°F | Ht 67.0 in | Wt 202.0 lb

## 2023-05-05 DIAGNOSIS — Z1231 Encounter for screening mammogram for malignant neoplasm of breast: Secondary | ICD-10-CM

## 2023-05-05 DIAGNOSIS — F902 Attention-deficit hyperactivity disorder, combined type: Secondary | ICD-10-CM

## 2023-05-05 DIAGNOSIS — F411 Generalized anxiety disorder: Secondary | ICD-10-CM

## 2023-05-05 DIAGNOSIS — M62830 Muscle spasm of back: Secondary | ICD-10-CM

## 2023-05-05 DIAGNOSIS — F3289 Other specified depressive episodes: Secondary | ICD-10-CM

## 2023-05-05 NOTE — Assessment & Plan Note (Signed)
Chronic Controlled, Stable Continue duloxetine 60 mg daily, alprazolam 0.5 mg 3 times daily as needed

## 2023-05-05 NOTE — Assessment & Plan Note (Signed)
New Diagnosed by her therapist  Continue atomoxetine 80 mg daily

## 2023-05-05 NOTE — Assessment & Plan Note (Signed)
Chronic Controlled, Stable Continue duloxetine 60 mg daily 

## 2023-05-05 NOTE — Progress Notes (Deleted)
  [name] [address] [place] [address] Phone: [phone] Subjective:    I'm seeing this patient by the request  of:  [name], MD  CC: Back and neck pain follow-up  [id number]  Erica [name] is a 42 y.o. female coming in with complaint of back and neck pain. OMT 02/01/2023. Patient states   Medications patient has been prescribed: None  Taking:         Reviewed prior external information including notes and imaging from previsou exam, outside providers and external EMR if available.  Since we have seen patient has been seeing behavioral health regularly for the generalized anxiety and saw primary care for ADHD  As well as notes that were available from care everywhere and other healthcare systems.  Past medical history, social, surgical and family history all reviewed in electronic medical record.  No pertanent information unless stated regarding to the chief complaint.   Past Medical History:  Diagnosis Date   Allergy    Anxiety    Depression    STD (sexually transmitted disease)    chlamydia treated 2023    No Known Allergies   Review of Systems:  No headache, visual changes, nausea, vomiting, diarrhea, constipation, dizziness, abdominal pain, skin rash, fevers, chills, night sweats, weight loss, swollen lymph nodes, body aches, joint swelling, chest pain, shortness of breath, mood changes. POSITIVE muscle aches  Objective  There were no vitals taken for this visit.   General: No apparent distress alert and oriented x3 mood and affect normal, dressed appropriately.  HEENT: Pupils equal, extraocular movements intact  Respiratory: Patient's speak in full sentences and does not appear short of breath  Cardiovascular: No lower extremity edema, non tender, no erythema  Gait relatively normal MSK:  Back exam shows  Osteopathic findings  C2 flexed rotated and side bent right C6 flexed rotated and side bent  left T3 extended rotated and side bent right inhaled rib T9 extended rotated and side bent left L2 flexed rotated and side bent right Sacrum right on right    Assessment and Plan:  No problem-specific Assessment & Plan notes found for this encounter.    Nonallopathic problems  Decision today to treat with OMT was based on Physical Exam  After verbal consent patient was treated with HVLA, ME, FPR techniques in cervical, rib, thoracic, lumbar, and sacral  areas  Patient tolerated the procedure well with improvement in symptoms  Patient given exercises, stretches and lifestyle modifications  See medications in patient instructions if given  Patient will follow up in 4-8 weeks    The above documentation has been reviewed and is accurate and complete Judi Saa, DO          Note: This dictation was prepared with Dragon dictation along with smaller phrase technology. Any transcriptional errors that result from this process are unintentional.

## 2023-05-05 NOTE — Assessment & Plan Note (Signed)
Chronic Intermittent Continue meloxicam 15 mg daily  Continue Flexeril 10 mg 3 times daily as needed for spasms - not taking often

## 2023-05-08 ENCOUNTER — Ambulatory Visit (INDEPENDENT_AMBULATORY_CARE_PROVIDER_SITE_OTHER): Payer: PRIVATE HEALTH INSURANCE | Admitting: Psychology

## 2023-05-08 ENCOUNTER — Ambulatory Visit: Payer: Self-pay | Admitting: Family Medicine

## 2023-05-08 DIAGNOSIS — F331 Major depressive disorder, recurrent, moderate: Secondary | ICD-10-CM

## 2023-05-08 DIAGNOSIS — F411 Generalized anxiety disorder: Secondary | ICD-10-CM

## 2023-05-08 DIAGNOSIS — F900 Attention-deficit hyperactivity disorder, predominantly inattentive type: Secondary | ICD-10-CM
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
## 2023-05-15 ENCOUNTER — Ambulatory Visit: Payer: PRIVATE HEALTH INSURANCE | Admitting: Psychology

## 2023-05-15 DIAGNOSIS — F411 Generalized anxiety disorder: Secondary | ICD-10-CM

## 2023-05-15 DIAGNOSIS — F900 Attention-deficit hyperactivity disorder, predominantly inattentive type: Secondary | ICD-10-CM | POA: Diagnosis not present

## 2023-05-15 DIAGNOSIS — F331 Major depressive disorder, recurrent, moderate: Secondary | ICD-10-CM
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
## 2023-05-22 ENCOUNTER — Ambulatory Visit (INDEPENDENT_AMBULATORY_CARE_PROVIDER_SITE_OTHER): Payer: PRIVATE HEALTH INSURANCE | Admitting: Psychology

## 2023-05-22 DIAGNOSIS — F411 Generalized anxiety disorder: Secondary | ICD-10-CM

## 2023-05-22 DIAGNOSIS — F900 Attention-deficit hyperactivity disorder, predominantly inattentive type: Secondary | ICD-10-CM | POA: Diagnosis not present

## 2023-05-22 DIAGNOSIS — F331 Major depressive disorder, recurrent, moderate: Secondary | ICD-10-CM
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
## 2023-05-29 ENCOUNTER — Other Ambulatory Visit: Payer: Self-pay | Admitting: Internal Medicine

## 2023-06-01 NOTE — Progress Notes (Signed)
  [name] [address] [place] [address] Phone: [phone] Subjective:   Bruce Donath, am serving as a scribe for Dr. Antoine Primas.  I'm seeing this patient by the request  of:  [name], MD  CC: back and hip pain   [name]  Erica [name] is a 42 y.o. female coming in with complaint of back and neck pain. OMT on 02/01/2023. Patient states that she feels like she has greater ROM. Doing well since last visit. Wears brace when she gets tired.   Medications patient has been prescribed:   Taking:         Reviewed prior external information including notes and imaging from previsou exam, outside providers and external EMR if available.   As well as notes that were available from care everywhere and other healthcare systems.  Past medical history, social, surgical and family history all reviewed in electronic medical record.  No pertanent information unless stated regarding to the chief complaint.   Past Medical History:  Diagnosis Date   Allergy    Anxiety    Depression    STD (sexually transmitted disease)    chlamydia treated 2023    No Known Allergies   Review of Systems:  No headache, visual changes, nausea, vomiting, diarrhea, constipation, dizziness, abdominal pain, skin rash, fevers, chills, night sweats, weight loss, swollen lymph nodes, body aches, joint swelling, chest pain, shortness of breath, mood changes. POSITIVE muscle aches  Objective  Blood pressure 102/62, pulse 93, height 5\' 7"  (1.702 m), weight 202 lb (91.6 kg), SpO2 93%.   General: No apparent distress alert and oriented x3 mood and affect normal, dressed appropriately.  HEENT: Pupils equal, extraocular movements intact  Respiratory: Patient's speak in full sentences and does not appear short of breath  Cardiovascular: No lower extremity edema, non tender, no erythema  MSK:  Back exam shows tightness still around the left hip ttp over the  left side  Tightness on the lower back    Osteopathic findings  C2 flexed rotated and side bent right C7 flexed rotated and side bent left T5 extended rotated and side bent right inhaled rib T8 extended rotated and side bent left L3 flexed rotated and side bent right Sacrum left on left        Assessment and Plan:  Left hip pain Discussed icing regimen and home exercises, discussed with patient about which activities to do and which ones to avoid.  Increase activity slowly otherwise. RTC in 8 weeks     Nonallopathic problems  Decision today to treat with OMT was based on Physical Exam  After verbal consent patient was treated with HVLA, ME, FPR techniques in cervical, rib, thoracic, lumbar, and sacral  areas  Patient tolerated the procedure well with improvement in symptoms  Patient given exercises, stretches and lifestyle modifications  See medications in patient instructions if given  Patient will follow up in 4-8 weeks     The above documentation has been reviewed and is accurate and complete Judi Saa, DO         Note: This dictation was prepared with Dragon dictation along with smaller phrase technology. Any transcriptional errors that result from this process are unintentional.

## 2023-06-02 ENCOUNTER — Inpatient Hospital Stay: Admission: RE | Admit: 2023-06-02 | Payer: Self-pay | Source: Ambulatory Visit

## 2023-06-05 ENCOUNTER — Ambulatory Visit (INDEPENDENT_AMBULATORY_CARE_PROVIDER_SITE_OTHER): Payer: PRIVATE HEALTH INSURANCE | Admitting: Psychology

## 2023-06-05 DIAGNOSIS — F411 Generalized anxiety disorder: Secondary | ICD-10-CM

## 2023-06-05 DIAGNOSIS — F900 Attention-deficit hyperactivity disorder, predominantly inattentive type: Secondary | ICD-10-CM

## 2023-06-05 DIAGNOSIS — F331 Major depressive disorder, recurrent, moderate: Secondary | ICD-10-CM
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
## 2023-06-07 ENCOUNTER — Ambulatory Visit: Payer: 59 | Admitting: Family Medicine

## 2023-06-07 ENCOUNTER — Encounter: Payer: Self-pay | Admitting: Family Medicine

## 2023-06-07 VITALS — BP 102/62 | HR 93 | Ht 67.0 in | Wt 202.0 lb

## 2023-06-07 DIAGNOSIS — M9902 Segmental and somatic dysfunction of thoracic region: Secondary | ICD-10-CM

## 2023-06-07 DIAGNOSIS — M9901 Segmental and somatic dysfunction of cervical region: Secondary | ICD-10-CM

## 2023-06-07 DIAGNOSIS — M9908 Segmental and somatic dysfunction of rib cage: Secondary | ICD-10-CM | POA: Diagnosis not present

## 2023-06-07 DIAGNOSIS — M9903 Segmental and somatic dysfunction of lumbar region: Secondary | ICD-10-CM | POA: Diagnosis not present

## 2023-06-07 DIAGNOSIS — M25552 Pain in left hip: Secondary | ICD-10-CM | POA: Diagnosis not present

## 2023-06-07 DIAGNOSIS — M9904 Segmental and somatic dysfunction of sacral region: Secondary | ICD-10-CM

## 2023-06-07 NOTE — Assessment & Plan Note (Signed)
Discussed icing regimen and home exercises, discussed with patient about which activities to do and which ones to avoid.  Increase activity slowly otherwise. RTC in 8 weeks

## 2023-06-07 NOTE — Patient Instructions (Signed)
Erica [name] See me in 2-3 months

## 2023-06-12 ENCOUNTER — Ambulatory Visit: Payer: PRIVATE HEALTH INSURANCE | Admitting: Psychology

## 2023-06-14 ENCOUNTER — Other Ambulatory Visit: Payer: Self-pay | Admitting: Internal Medicine

## 2023-06-19 ENCOUNTER — Ambulatory Visit: Payer: PRIVATE HEALTH INSURANCE | Admitting: Psychology

## 2023-06-21 ENCOUNTER — Ambulatory Visit (INDEPENDENT_AMBULATORY_CARE_PROVIDER_SITE_OTHER): Payer: PRIVATE HEALTH INSURANCE | Admitting: Psychology

## 2023-06-21 DIAGNOSIS — F331 Major depressive disorder, recurrent, moderate: Secondary | ICD-10-CM | POA: Diagnosis not present

## 2023-06-21 DIAGNOSIS — F902 Attention-deficit hyperactivity disorder, combined type: Secondary | ICD-10-CM

## 2023-06-21 DIAGNOSIS — F411 Generalized anxiety disorder: Secondary | ICD-10-CM | POA: Diagnosis not present

## 2023-06-21 DIAGNOSIS — F900 Attention-deficit hyperactivity disorder, predominantly inattentive type: Secondary | ICD-10-CM
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
## 2023-06-24 ENCOUNTER — Other Ambulatory Visit: Payer: Self-pay | Admitting: Internal Medicine

## 2023-06-26 ENCOUNTER — Ambulatory Visit: Payer: PRIVATE HEALTH INSURANCE | Admitting: Psychology

## 2023-06-26 ENCOUNTER — Ambulatory Visit (INDEPENDENT_AMBULATORY_CARE_PROVIDER_SITE_OTHER): Payer: PRIVATE HEALTH INSURANCE | Admitting: Psychology

## 2023-06-26 ENCOUNTER — Other Ambulatory Visit: Payer: Self-pay | Admitting: Internal Medicine

## 2023-06-26 DIAGNOSIS — F900 Attention-deficit hyperactivity disorder, predominantly inattentive type: Secondary | ICD-10-CM | POA: Diagnosis not present

## 2023-06-26 DIAGNOSIS — F331 Major depressive disorder, recurrent, moderate: Secondary | ICD-10-CM | POA: Diagnosis not present

## 2023-06-26 DIAGNOSIS — F411 Generalized anxiety disorder: Secondary | ICD-10-CM
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
## 2023-07-03 ENCOUNTER — Ambulatory Visit: Payer: PRIVATE HEALTH INSURANCE | Admitting: Psychology

## 2023-07-05 ENCOUNTER — Ambulatory Visit: Payer: 59 | Admitting: Psychology

## 2023-07-10 ENCOUNTER — Ambulatory Visit: Payer: PRIVATE HEALTH INSURANCE | Admitting: Psychology

## 2023-07-17 ENCOUNTER — Ambulatory Visit: Payer: PRIVATE HEALTH INSURANCE | Admitting: Psychology

## 2023-07-19 ENCOUNTER — Ambulatory Visit: Payer: PRIVATE HEALTH INSURANCE | Admitting: Psychology

## 2023-07-19 DIAGNOSIS — F900 Attention-deficit hyperactivity disorder, predominantly inattentive type: Secondary | ICD-10-CM | POA: Diagnosis not present

## 2023-07-19 DIAGNOSIS — F411 Generalized anxiety disorder: Secondary | ICD-10-CM

## 2023-07-19 DIAGNOSIS — F331 Major depressive disorder, recurrent, moderate: Secondary | ICD-10-CM | POA: Diagnosis not present
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
## 2023-07-24 ENCOUNTER — Ambulatory Visit: Payer: PRIVATE HEALTH INSURANCE | Admitting: Psychology

## 2023-07-27 ENCOUNTER — Other Ambulatory Visit: Payer: Self-pay | Admitting: Internal Medicine

## 2023-08-02 ENCOUNTER — Ambulatory Visit: Payer: PRIVATE HEALTH INSURANCE | Admitting: Psychology

## 2023-08-02 DIAGNOSIS — F411 Generalized anxiety disorder: Secondary | ICD-10-CM | POA: Diagnosis not present

## 2023-08-02 DIAGNOSIS — F331 Major depressive disorder, recurrent, moderate: Secondary | ICD-10-CM | POA: Diagnosis not present

## 2023-08-02 DIAGNOSIS — F900 Attention-deficit hyperactivity disorder, predominantly inattentive type: Secondary | ICD-10-CM | POA: Diagnosis not present
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
## 2023-08-16 ENCOUNTER — Ambulatory Visit: Payer: PRIVATE HEALTH INSURANCE | Admitting: Psychology

## 2023-08-16 DIAGNOSIS — F411 Generalized anxiety disorder: Secondary | ICD-10-CM

## 2023-08-16 DIAGNOSIS — F331 Major depressive disorder, recurrent, moderate: Secondary | ICD-10-CM | POA: Diagnosis not present

## 2023-08-16 DIAGNOSIS — F900 Attention-deficit hyperactivity disorder, predominantly inattentive type: Secondary | ICD-10-CM | POA: Diagnosis not present
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
## 2023-08-21 ENCOUNTER — Ambulatory Visit: Payer: PRIVATE HEALTH INSURANCE | Admitting: Psychology

## 2023-08-30 ENCOUNTER — Ambulatory Visit: Payer: PRIVATE HEALTH INSURANCE | Admitting: Psychology

## 2023-08-30 DIAGNOSIS — F900 Attention-deficit hyperactivity disorder, predominantly inattentive type: Secondary | ICD-10-CM | POA: Diagnosis not present

## 2023-08-30 DIAGNOSIS — F411 Generalized anxiety disorder: Secondary | ICD-10-CM

## 2023-08-30 DIAGNOSIS — F331 Major depressive disorder, recurrent, moderate: Secondary | ICD-10-CM | POA: Diagnosis not present
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
## 2023-09-06 ENCOUNTER — Ambulatory Visit: Payer: 59 | Admitting: Family Medicine

## 2023-09-13 ENCOUNTER — Ambulatory Visit: Payer: PRIVATE HEALTH INSURANCE | Admitting: Psychology

## 2023-09-13 DIAGNOSIS — F331 Major depressive disorder, recurrent, moderate: Secondary | ICD-10-CM

## 2023-09-13 DIAGNOSIS — F411 Generalized anxiety disorder: Secondary | ICD-10-CM | POA: Diagnosis not present

## 2023-09-13 DIAGNOSIS — F900 Attention-deficit hyperactivity disorder, predominantly inattentive type: Secondary | ICD-10-CM
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
## 2023-09-15 NOTE — Progress Notes (Deleted)
  [name] [address] [place] [address] Phone: [phone] Subjective:    I'm seeing this patient by the request  of:  [name], MD  CC: back and neck pain   [name]  Erica [name] is a 42 y.o. female coming in with complaint of back and neck pain. OMT on 06/07/2023. Patient states   Medications patient has been prescribed:   Taking:         Reviewed prior external information including notes and imaging from previsou exam, outside providers and external EMR if available.   As well as notes that were available from care everywhere and other healthcare systems.  Past medical history, social, surgical and family history all reviewed in electronic medical record.  No pertanent information unless stated regarding to the chief complaint.   Past Medical History:  Diagnosis Date   Allergy    Anxiety    Depression    STD (sexually transmitted disease)    chlamydia treated 2023    No Known Allergies   Review of Systems:  No headache, visual changes, nausea, vomiting, diarrhea, constipation, dizziness, abdominal pain, skin rash, fevers, chills, night sweats, weight loss, swollen lymph nodes, body aches, joint swelling, chest pain, shortness of breath, mood changes. POSITIVE muscle aches  Objective  There were no vitals taken for this visit.   General: No apparent distress alert and oriented x3 mood and affect normal, dressed appropriately.  HEENT: Pupils equal, extraocular movements intact  Respiratory: Patient's speak in full sentences and does not appear short of breath  Cardiovascular: No lower extremity edema, non tender, no erythema  Gait MSK:  Back   Osteopathic findings  C2 flexed rotated and side bent right C6 flexed rotated and side bent left T3 extended rotated and side bent right inhaled rib T9 extended rotated and side bent left L2 flexed rotated and side bent right Sacrum right on right        Assessment and Plan:  No problem-specific Assessment & Plan notes found for this encounter.    Nonallopathic problems  Decision today to treat with OMT was based on Physical Exam  After verbal consent patient was treated with HVLA, ME, FPR techniques in cervical, rib, thoracic, lumbar, and sacral  areas  Patient tolerated the procedure well with improvement in symptoms  Patient given exercises, stretches and lifestyle modifications  See medications in patient instructions if given  Patient will follow up in 4-8 weeks     The above documentation has been reviewed and is accurate and complete Judi Saa, DO         Note: This dictation was prepared with Dragon dictation along with smaller phrase technology. Any transcriptional errors that result from this process are unintentional.

## 2023-09-18 ENCOUNTER — Ambulatory Visit: Payer: 59 | Admitting: Family Medicine

## 2023-09-23 ENCOUNTER — Other Ambulatory Visit: Payer: Self-pay | Admitting: Internal Medicine

## 2023-09-24 ENCOUNTER — Other Ambulatory Visit: Payer: Self-pay | Admitting: Internal Medicine

## 2023-10-11 ENCOUNTER — Ambulatory Visit: Payer: PRIVATE HEALTH INSURANCE | Admitting: Psychology

## 2023-10-11 DIAGNOSIS — F331 Major depressive disorder, recurrent, moderate: Secondary | ICD-10-CM | POA: Diagnosis not present

## 2023-10-11 DIAGNOSIS — F411 Generalized anxiety disorder: Secondary | ICD-10-CM | POA: Diagnosis not present

## 2023-10-11 DIAGNOSIS — F900 Attention-deficit hyperactivity disorder, predominantly inattentive type: Secondary | ICD-10-CM | POA: Diagnosis not present
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
## 2023-10-12 ENCOUNTER — Ambulatory Visit: Payer: 59 | Admitting: Radiology

## 2023-10-25 ENCOUNTER — Ambulatory Visit: Payer: 59 | Admitting: Psychology

## 2023-10-25 DIAGNOSIS — F331 Major depressive disorder, recurrent, moderate: Secondary | ICD-10-CM

## 2023-10-25 DIAGNOSIS — F411 Generalized anxiety disorder: Secondary | ICD-10-CM | POA: Diagnosis not present

## 2023-10-25 DIAGNOSIS — F900 Attention-deficit hyperactivity disorder, predominantly inattentive type: Secondary | ICD-10-CM | POA: Diagnosis not present
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
## 2023-10-31 ENCOUNTER — Other Ambulatory Visit: Payer: Self-pay | Admitting: Internal Medicine

## 2023-11-08 ENCOUNTER — Ambulatory Visit: Payer: PRIVATE HEALTH INSURANCE | Admitting: Psychology

## 2023-11-08 DIAGNOSIS — F411 Generalized anxiety disorder: Secondary | ICD-10-CM | POA: Diagnosis not present

## 2023-11-08 DIAGNOSIS — F331 Major depressive disorder, recurrent, moderate: Secondary | ICD-10-CM

## 2023-11-08 DIAGNOSIS — F900 Attention-deficit hyperactivity disorder, predominantly inattentive type: Secondary | ICD-10-CM
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
## 2023-11-10 ENCOUNTER — Ambulatory Visit: Payer: Self-pay | Admitting: Internal Medicine

## 2023-11-16 DIAGNOSIS — E785 Hyperlipidemia, unspecified: Secondary | ICD-10-CM | POA: Insufficient documentation

## 2023-11-16 NOTE — Patient Instructions (Addendum)
       Medications changes include :   None    A referral was ordered and someone will call you to schedule an appointment.     Return in about 6 months (around 05/16/2024) for Physical Exam.

## 2023-11-16 NOTE — Progress Notes (Unsigned)
      Subjective:    Patient ID: Erica [name], female    DOB: 1981/04/08, 43 y.o.   MRN: [id number]     HPI Bobie is here for follow up of her chronic medical problems.  Doing ok - depression is better.  She feels like her medications are working well overall.  Increased stress today -- She had a rough morning - had to go to court today with her ex-husband.    Medications and allergies reviewed with patient and updated if appropriate.  Current Outpatient Medications on File Prior to Visit  Medication Sig Dispense Refill   ALPRAZolam (XANAX) 0.5 MG tablet Take 1 tablet (0.5 mg total) by mouth 3 (three) times daily as needed for anxiety. 45 tablet 2   atomoxetine (STRATTERA) 80 MG capsule TAKE 1 CAPSULE BY MOUTH EVERY DAY 90 capsule 1   cyclobenzaprine (FLEXERIL) 10 MG tablet TAKE 1 TABLET(10 MG) BY MOUTH THREE TIMES DAILY AS NEEDED FOR MUSCLE SPASMS 30 tablet 3   DULoxetine (CYMBALTA) 60 MG capsule TAKE 1 CAPSULE BY MOUTH EVERY DAY 90 capsule 0   meloxicam (MOBIC) 15 MG tablet TAKE 1 TABLET BY MOUTH EVERY DAY 30 tablet 1   No current facility-administered medications on file prior to visit.     Review of Systems     Objective:   Vitals:   11/17/23 1425  BP: 106/74  Pulse: 83  Temp: 98.2 F (36.8 C)  SpO2: 95%   BP Readings from Last 3 Encounters:  11/17/23 106/74  06/07/23 102/62  05/05/23 126/78   Wt Readings from Last 3 Encounters:  11/17/23 201 lb (91.2 kg)  06/07/23 202 lb (91.6 kg)  05/05/23 202 lb (91.6 kg)   Body mass index is 31.48 kg/m.    Physical Exam Constitutional:      General: She is not in acute distress.    Appearance: Normal appearance. She is not ill-appearing.  HENT:     Head: Normocephalic and atraumatic.  Skin:    General: Skin is warm and dry.  Neurological:     Mental Status: She is alert. Mental status is at baseline.  Psychiatric:        Mood and Affect: Mood normal.        Behavior: Behavior normal.        Thought  Content: Thought content normal.        Judgment: Judgment normal.        Lab Results  Component Value Date   WBC 6.6 03/21/2022   HGB 13.3 03/21/2022   HCT 40.0 03/21/2022   PLT 268.0 03/21/2022   GLUCOSE 97 03/21/2022   CHOL 215 (H) 03/21/2022   TRIG 127.0 03/21/2022   HDL 43.20 03/21/2022   LDLDIRECT 154.6 01/25/2013   LDLCALC 147 (H) 03/21/2022   ALT 17 03/21/2022   AST 19 03/21/2022   NA 139 03/21/2022   K 4.0 03/21/2022   CL 105 03/21/2022   CREATININE 0.82 03/21/2022   BUN 13 03/21/2022   CO2 28 03/21/2022   TSH 2.12 03/21/2022     Assessment & Plan:    See Problem List for Assessment and Plan of chronic medical problems.

## 2023-11-17 ENCOUNTER — Encounter: Payer: Self-pay | Admitting: Internal Medicine

## 2023-11-17 ENCOUNTER — Ambulatory Visit: Payer: Medicaid Other | Admitting: Internal Medicine

## 2023-11-17 VITALS — BP 106/74 | HR 83 | Temp 98.2°F | Ht 67.0 in | Wt 201.0 lb

## 2023-11-17 DIAGNOSIS — M62830 Muscle spasm of back: Secondary | ICD-10-CM | POA: Diagnosis not present

## 2023-11-17 DIAGNOSIS — F902 Attention-deficit hyperactivity disorder, combined type: Secondary | ICD-10-CM

## 2023-11-17 DIAGNOSIS — F3289 Other specified depressive episodes: Secondary | ICD-10-CM | POA: Diagnosis not present

## 2023-11-17 DIAGNOSIS — F411 Generalized anxiety disorder: Secondary | ICD-10-CM

## 2023-11-17 NOTE — Assessment & Plan Note (Signed)
Chronic Controlled, Stable Continue duloxetine 60 mg daily, alprazolam 0.5 mg 3 times daily as needed

## 2023-11-17 NOTE — Assessment & Plan Note (Signed)
Chronic Controlled, Stable Continue duloxetine 60 mg daily 

## 2023-11-17 NOTE — Assessment & Plan Note (Signed)
Chronic Intermittent Continue meloxicam 15 mg daily  Continue Flexeril 10 mg 3 times daily as needed for spasms - not taking often

## 2023-11-17 NOTE — Assessment & Plan Note (Signed)
New Diagnosed by her therapist  Continue atomoxetine 80 mg daily

## 2023-11-22 ENCOUNTER — Ambulatory Visit: Payer: PRIVATE HEALTH INSURANCE | Admitting: Psychology

## 2023-11-22 DIAGNOSIS — F411 Generalized anxiety disorder: Secondary | ICD-10-CM

## 2023-11-22 DIAGNOSIS — F331 Major depressive disorder, recurrent, moderate: Secondary | ICD-10-CM

## 2023-11-22 DIAGNOSIS — F988 Other specified behavioral and emotional disorders with onset usually occurring in childhood and adolescence: Secondary | ICD-10-CM
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
## 2023-12-06 ENCOUNTER — Ambulatory Visit: Payer: 59 | Admitting: Psychology

## 2023-12-20 ENCOUNTER — Ambulatory Visit (INDEPENDENT_AMBULATORY_CARE_PROVIDER_SITE_OTHER): Payer: PRIVATE HEALTH INSURANCE | Admitting: Psychology

## 2023-12-20 DIAGNOSIS — F411 Generalized anxiety disorder: Secondary | ICD-10-CM | POA: Diagnosis not present

## 2023-12-20 DIAGNOSIS — F331 Major depressive disorder, recurrent, moderate: Secondary | ICD-10-CM | POA: Diagnosis not present

## 2023-12-20 DIAGNOSIS — F900 Attention-deficit hyperactivity disorder, predominantly inattentive type: Secondary | ICD-10-CM | POA: Diagnosis not present
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
## 2023-12-21 ENCOUNTER — Other Ambulatory Visit: Payer: Self-pay | Admitting: Internal Medicine

## 2024-01-03 ENCOUNTER — Ambulatory Visit: Payer: 59 | Admitting: Psychology

## 2024-01-03 DIAGNOSIS — F900 Attention-deficit hyperactivity disorder, predominantly inattentive type: Secondary | ICD-10-CM | POA: Diagnosis not present

## 2024-01-03 DIAGNOSIS — F331 Major depressive disorder, recurrent, moderate: Secondary | ICD-10-CM | POA: Diagnosis not present

## 2024-01-03 DIAGNOSIS — F411 Generalized anxiety disorder: Secondary | ICD-10-CM
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
## 2024-01-04 NOTE — Progress Notes (Signed)
 Erica [name] [address] [place] [address] Phone: [phone] Subjective:   Erica [name], am serving as a scribe for Dr. Ronnell [name].  I'm seeing this patient by the request  of:  Erica Dauphin, MD  CC: Low back pain,  [name]  Erica [name] is a 43 y.o. female coming in with complaint of back and neck pain. OMT 06/07/2023. Also f/u for L knee pain. Patient states that her back has been doing ok. L knee feels swollen since a trip to [place] where she was skiing and hiking. Unable to sit back on her heels.   Medications patient has been prescribed: None  Taking:         Reviewed prior external information including notes and imaging from previsou exam, outside providers and external EMR if available.   As well as notes that were available from care everywhere and other healthcare systems.  Past medical history, social, surgical and family history all reviewed in electronic medical record.  No pertanent information unless stated regarding to the chief complaint.   Past Medical History:  Diagnosis Date   Allergy    Anxiety    Depression    STD (sexually transmitted disease)    chlamydia treated 2023    No Known Allergies   Review of Systems:  No headache, visual changes, nausea, vomiting, diarrhea, constipation, dizziness, abdominal pain, skin rash, fevers, chills, night sweats, weight loss, swollen lymph nodes, body aches, joint swelling, chest pain, shortness of breath, mood changes. POSITIVE muscle aches  Objective  Blood pressure 124/78, pulse (!) 58, height 5\' 7"  (1.702 m), weight 195 lb (88.5 kg), SpO2 98%.   General: No apparent distress alert and oriented x3 mood and affect normal, dressed appropriately.  HEENT: Pupils equal, extraocular movements intact  Respiratory: Patient's speak in full sentences and does not appear short of breath  Cardiovascular: No lower extremity edema, non tender, no erythema  Gait  antalgic favoring the left knee MSK:  Back does some loss lordosis noted.  Some tenderness to palpation of the paraspinal musculature.  Left knee has trace effusion noted.  Tenderness to palpation over the patellofemoral joint.  Negative McMurray's noted.  Osteopathic findings  C2 flexed rotated and side bent right C6 flexed rotated and side bent left T3 extended rotated and side bent right inhaled rib T9 extended rotated and side bent left L2 flexed rotated and side bent right Sacrum left on left  Limited muscular skeletal ultrasound was performed and interpreted by Erica [name], M  Hypoechoic changes in the patellofemoral joint consistent with a resolving effusion.  Meniscus medial and lateral appear unremarkable.    Assessment and Plan:  Left hip pain Left hip does have some tightness noted.  Some tenderness to palpation noted.  Could be compensating for some of the knee pain at the moment.  Patellofemoral syndrome of left knee Chronic, with exacerbation.  Distention swelling noted today on ultrasound.  Patient wants to continue with conservative therapy hold on injection, discussed bracing and VMO strengthening.  Follow-up again in 6 weeks    Nonallopathic problems  Decision today to treat with OMT was based on Physical Exam  After verbal consent patient was treated with HVLA, ME, FPR techniques in cervical, rib, thoracic, lumbar, and sacral  areas  Patient tolerated the procedure well with improvement in symptoms  Patient given exercises, stretches and lifestyle modifications  See medications in patient instructions if given  Patient will follow up in  4-8 weeks    The above documentation has been reviewed and is accurate and complete Erica Margo, DO          Note: This dictation was prepared with Dragon dictation along with smaller phrase technology. Any transcriptional errors that result from this process are unintentional.

## 2024-01-14 ENCOUNTER — Other Ambulatory Visit: Payer: Self-pay | Admitting: Internal Medicine

## 2024-01-15 ENCOUNTER — Ambulatory Visit: Admitting: Family Medicine

## 2024-01-15 ENCOUNTER — Encounter: Payer: Self-pay | Admitting: Family Medicine

## 2024-01-15 ENCOUNTER — Other Ambulatory Visit: Payer: Self-pay | Admitting: Internal Medicine

## 2024-01-15 ENCOUNTER — Other Ambulatory Visit: Payer: Self-pay

## 2024-01-15 VITALS — BP 124/78 | HR 58 | Ht 67.0 in | Wt 195.0 lb

## 2024-01-15 DIAGNOSIS — M9902 Segmental and somatic dysfunction of thoracic region: Secondary | ICD-10-CM

## 2024-01-15 DIAGNOSIS — M25552 Pain in left hip: Secondary | ICD-10-CM

## 2024-01-15 DIAGNOSIS — M9901 Segmental and somatic dysfunction of cervical region: Secondary | ICD-10-CM

## 2024-01-15 DIAGNOSIS — M25562 Pain in left knee: Secondary | ICD-10-CM

## 2024-01-15 DIAGNOSIS — M9908 Segmental and somatic dysfunction of rib cage: Secondary | ICD-10-CM | POA: Diagnosis not present

## 2024-01-15 DIAGNOSIS — M9903 Segmental and somatic dysfunction of lumbar region: Secondary | ICD-10-CM

## 2024-01-15 DIAGNOSIS — M222X2 Patellofemoral disorders, left knee: Secondary | ICD-10-CM

## 2024-01-15 DIAGNOSIS — M9904 Segmental and somatic dysfunction of sacral region: Secondary | ICD-10-CM | POA: Diagnosis not present

## 2024-01-15 NOTE — Patient Instructions (Signed)
Do prescribed exercises at least 3x a week  See you again in 6 weeks

## 2024-01-15 NOTE — Assessment & Plan Note (Signed)
 Left hip does have some tightness noted.  Some tenderness to palpation noted.  Could be compensating for some of the knee pain at the moment.

## 2024-01-15 NOTE — Assessment & Plan Note (Signed)
 Chronic, with exacerbation.  Distention swelling noted today on ultrasound.  Patient wants to continue with conservative therapy hold on injection, discussed bracing and VMO strengthening.  Follow-up again in 6 weeks

## 2024-01-17 ENCOUNTER — Ambulatory Visit (INDEPENDENT_AMBULATORY_CARE_PROVIDER_SITE_OTHER): Payer: PRIVATE HEALTH INSURANCE | Admitting: Psychology

## 2024-01-17 DIAGNOSIS — F411 Generalized anxiety disorder: Secondary | ICD-10-CM | POA: Diagnosis not present

## 2024-01-17 DIAGNOSIS — F331 Major depressive disorder, recurrent, moderate: Secondary | ICD-10-CM | POA: Diagnosis not present

## 2024-01-17 DIAGNOSIS — F900 Attention-deficit hyperactivity disorder, predominantly inattentive type: Secondary | ICD-10-CM
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
## 2024-01-31 ENCOUNTER — Ambulatory Visit: Payer: PRIVATE HEALTH INSURANCE | Admitting: Psychology

## 2024-01-31 NOTE — Progress Notes (Unsigned)
   Erica Favors, PhD

## 2024-02-13 ENCOUNTER — Ambulatory Visit: Admitting: Family Medicine

## 2024-02-14 ENCOUNTER — Ambulatory Visit (INDEPENDENT_AMBULATORY_CARE_PROVIDER_SITE_OTHER): Payer: PRIVATE HEALTH INSURANCE | Admitting: Psychology

## 2024-02-14 DIAGNOSIS — F900 Attention-deficit hyperactivity disorder, predominantly inattentive type: Secondary | ICD-10-CM | POA: Diagnosis not present

## 2024-02-14 DIAGNOSIS — F411 Generalized anxiety disorder: Secondary | ICD-10-CM

## 2024-02-14 DIAGNOSIS — F331 Major depressive disorder, recurrent, moderate: Secondary | ICD-10-CM
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
## 2024-02-27 NOTE — Progress Notes (Unsigned)
  [name] [address] [place] [address] Phone: [phone] Subjective:   IBryan [name], am serving as a scribe for Dr. Ronnell Coins.  I'm seeing this patient by the request  of:  Colene Dauphin, MD  CC: Back pain follow-up  [name]  Erica [name] is a 43 y.o. female coming in with complaint of back and neck pain. OMT 01/15/2024. Also f/u for L knee and L hip pain. Patient states knee is doing much better. Low back weirdness with specific movement.  Medications patient has been prescribed:   Taking:         Reviewed prior external information including notes and imaging from previsou exam, outside providers and external EMR if available.   As well as notes that were available from care everywhere and other healthcare systems.  Past medical history, social, surgical and family history all reviewed in electronic medical record.  No pertanent information unless stated regarding to the chief complaint.   Past Medical History:  Diagnosis Date   Allergy    Anxiety    Depression    STD (sexually transmitted disease)    chlamydia treated 2023    No Known Allergies   Review of Systems:  No headache, visual changes, nausea, vomiting, diarrhea, constipation, dizziness, abdominal pain, skin rash, fevers, chills, night sweats, weight loss, swollen lymph nodes, body aches, joint swelling, chest pain, shortness of breath, mood changes. POSITIVE muscle aches  Objective  Blood pressure 124/76, pulse 70, height 5\' 7"  (1.702 m), weight 194 lb (88 kg), SpO2 98%.   General: No apparent distress alert and oriented x3 mood and affect normal, dressed appropriately.  HEENT: Pupils equal, extraocular movements intact  Respiratory: Patient's speak in full sentences and does not appear short of breath  Cardiovascular: No lower extremity edema, non tender, no erythema  Gait MSK:  Back tightness noted on the left side of the lower back.  Seems  to have a tightness with FABER left greater than right.  Osteopathic findings  C7 flexed rotated and side bent left T3 extended rotated and side bent right inhaled rib T9 extended rotated and side bent left L1 flexed rotated and side bent right L3 rotated and side bent left Sacrum right on right       Assessment and Plan:  Left hip pain Continues to be some muscular aspect.  Doing well.  Discussed with patient that I do think the psoas is getting tight more in the proximal aspect based on the symptoms.  Discussed icing regimen of home exercises, which activities to doing which ones to avoid.  Increase activity slowly.  Follow-up again in 6 to 8 weeks otherwise.    Nonallopathic problems  Decision today to treat with OMT was based on Physical Exam  After verbal consent patient was treated with HVLA, ME, FPR techniques in cervical, rib, thoracic, lumbar, and sacral  areas avoided HVLA on the neck  Patient tolerated the procedure well with improvement in symptoms  Patient given exercises, stretches and lifestyle modifications  See medications in patient instructions if given  Patient will follow up in 4-8 weeks    The above documentation has been reviewed and is accurate and complete Nargis Abrams M Cahterine Heinzel, DO          Note: This dictation was prepared with Dragon dictation along with smaller phrase technology. Any transcriptional errors that result from this process are unintentional.

## 2024-02-28 ENCOUNTER — Ambulatory Visit: Admitting: Family Medicine

## 2024-02-28 ENCOUNTER — Encounter: Payer: Self-pay | Admitting: Family Medicine

## 2024-02-28 ENCOUNTER — Ambulatory Visit (INDEPENDENT_AMBULATORY_CARE_PROVIDER_SITE_OTHER): Payer: PRIVATE HEALTH INSURANCE | Admitting: Psychology

## 2024-02-28 VITALS — BP 124/76 | HR 70 | Ht 67.0 in | Wt 194.0 lb

## 2024-02-28 DIAGNOSIS — M9908 Segmental and somatic dysfunction of rib cage: Secondary | ICD-10-CM | POA: Diagnosis not present

## 2024-02-28 DIAGNOSIS — M9903 Segmental and somatic dysfunction of lumbar region: Secondary | ICD-10-CM | POA: Diagnosis not present

## 2024-02-28 DIAGNOSIS — F331 Major depressive disorder, recurrent, moderate: Secondary | ICD-10-CM

## 2024-02-28 DIAGNOSIS — F411 Generalized anxiety disorder: Secondary | ICD-10-CM

## 2024-02-28 DIAGNOSIS — M9902 Segmental and somatic dysfunction of thoracic region: Secondary | ICD-10-CM | POA: Diagnosis not present

## 2024-02-28 DIAGNOSIS — M9901 Segmental and somatic dysfunction of cervical region: Secondary | ICD-10-CM | POA: Diagnosis not present

## 2024-02-28 DIAGNOSIS — M9904 Segmental and somatic dysfunction of sacral region: Secondary | ICD-10-CM

## 2024-02-28 DIAGNOSIS — M25552 Pain in left hip: Secondary | ICD-10-CM

## 2024-02-28 NOTE — Patient Instructions (Signed)
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 See you again in 8-10 weeks 

## 2024-02-28 NOTE — Assessment & Plan Note (Signed)
 Continues to be some muscular aspect.  Doing well.  Discussed with patient that I do think the psoas is getting tight more in the proximal aspect based on the symptoms.  Discussed icing regimen of home exercises, which activities to doing which ones to avoid.  Increase activity slowly.  Follow-up again in 6 to 8 weeks otherwise.
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
## 2024-03-12 ENCOUNTER — Other Ambulatory Visit: Payer: Self-pay | Admitting: Internal Medicine

## 2024-03-13 ENCOUNTER — Ambulatory Visit: Payer: PRIVATE HEALTH INSURANCE | Admitting: Psychology

## 2024-03-14 ENCOUNTER — Ambulatory Visit (INDEPENDENT_AMBULATORY_CARE_PROVIDER_SITE_OTHER): Payer: PRIVATE HEALTH INSURANCE | Admitting: Psychology

## 2024-03-14 DIAGNOSIS — F411 Generalized anxiety disorder: Secondary | ICD-10-CM

## 2024-03-14 DIAGNOSIS — F900 Attention-deficit hyperactivity disorder, predominantly inattentive type: Secondary | ICD-10-CM | POA: Diagnosis not present

## 2024-03-14 DIAGNOSIS — F331 Major depressive disorder, recurrent, moderate: Secondary | ICD-10-CM
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
## 2024-03-22 ENCOUNTER — Other Ambulatory Visit: Payer: Self-pay | Admitting: Internal Medicine

## 2024-03-27 ENCOUNTER — Ambulatory Visit: Payer: PRIVATE HEALTH INSURANCE | Admitting: Psychology

## 2024-04-01 ENCOUNTER — Ambulatory Visit (INDEPENDENT_AMBULATORY_CARE_PROVIDER_SITE_OTHER): Payer: PRIVATE HEALTH INSURANCE | Admitting: Psychology

## 2024-04-01 DIAGNOSIS — F900 Attention-deficit hyperactivity disorder, predominantly inattentive type: Secondary | ICD-10-CM | POA: Diagnosis not present

## 2024-04-01 DIAGNOSIS — F331 Major depressive disorder, recurrent, moderate: Secondary | ICD-10-CM | POA: Diagnosis not present

## 2024-04-01 DIAGNOSIS — F411 Generalized anxiety disorder: Secondary | ICD-10-CM
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
## 2024-04-10 ENCOUNTER — Ambulatory Visit: Payer: PRIVATE HEALTH INSURANCE | Admitting: Psychology

## 2024-04-24 ENCOUNTER — Ambulatory Visit (INDEPENDENT_AMBULATORY_CARE_PROVIDER_SITE_OTHER): Payer: PRIVATE HEALTH INSURANCE | Admitting: Psychology

## 2024-04-24 DIAGNOSIS — F411 Generalized anxiety disorder: Secondary | ICD-10-CM | POA: Diagnosis not present

## 2024-04-24 DIAGNOSIS — F900 Attention-deficit hyperactivity disorder, predominantly inattentive type: Secondary | ICD-10-CM | POA: Diagnosis not present

## 2024-04-24 DIAGNOSIS — F331 Major depressive disorder, recurrent, moderate: Secondary | ICD-10-CM | POA: Diagnosis not present
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
## 2024-05-08 ENCOUNTER — Ambulatory Visit: Payer: PRIVATE HEALTH INSURANCE | Admitting: Psychology

## 2024-05-08 DIAGNOSIS — F900 Attention-deficit hyperactivity disorder, predominantly inattentive type: Secondary | ICD-10-CM | POA: Diagnosis not present

## 2024-05-08 DIAGNOSIS — F331 Major depressive disorder, recurrent, moderate: Secondary | ICD-10-CM

## 2024-05-08 DIAGNOSIS — F330 Major depressive disorder, recurrent, mild: Secondary | ICD-10-CM

## 2024-05-08 DIAGNOSIS — F411 Generalized anxiety disorder: Secondary | ICD-10-CM | POA: Diagnosis not present
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
## 2024-05-13 ENCOUNTER — Other Ambulatory Visit: Payer: Self-pay | Admitting: Internal Medicine

## 2024-05-16 NOTE — Patient Instructions (Addendum)
       Medications changes include :   None    A referral was ordered and someone will call you to schedule an appointment.     Return in about 6 months (around 11/17/2024) for Physical Exam.

## 2024-05-16 NOTE — Progress Notes (Signed)
      Subjective:    Patient ID: Erica [name], female    DOB: 13-Sep-1981, 43 y.o.   MRN: [id number]     HPI Erica [name] is here for follow up of her chronic medical problems.    Medications and allergies reviewed with patient and updated if appropriate.  Current Outpatient Medications on File Prior to Visit  Medication Sig Dispense Refill   ALPRAZolam  (XANAX ) 0.5 MG tablet TAKE 1 TABLET (0.5 MG TOTAL) BY MOUTH 3 (THREE) TIMES DAILY AS NEEDED FOR ANXIETY. 45 tablet 0   atomoxetine  (STRATTERA ) 80 MG capsule TAKE 1 CAPSULE BY MOUTH EVERY DAY 90 capsule 1   cyclobenzaprine  (FLEXERIL ) 10 MG tablet TAKE 1 TABLET BY MOUTH THREE TIMES DAILY AS NEEDED FOR MUSCLE SPASMS 30 tablet 3   DULoxetine  (CYMBALTA ) 60 MG capsule TAKE 1 CAPSULE BY MOUTH EVERY DAY 90 capsule 0   meloxicam  (MOBIC ) 15 MG tablet TAKE 1 TABLET BY MOUTH EVERY DAY 30 tablet 1   No current facility-administered medications on file prior to visit.     Review of Systems     Objective:  There were no vitals filed for this visit. BP Readings from Last 3 Encounters:  02/28/24 124/76  01/15/24 124/78  11/17/23 106/74   Wt Readings from Last 3 Encounters:  02/28/24 194 lb (88 kg)  01/15/24 195 lb (88.5 kg)  11/17/23 201 lb (91.2 kg)   There is no height or weight on file to calculate BMI.    Physical Exam     Lab Results  Component Value Date   WBC 6.6 03/21/2022   HGB 13.3 03/21/2022   HCT 40.0 03/21/2022   PLT 268.0 03/21/2022   GLUCOSE 97 03/21/2022   CHOL 215 (H) 03/21/2022   TRIG 127.0 03/21/2022   HDL 43.20 03/21/2022   LDLDIRECT 154.6 01/25/2013   LDLCALC 147 (H) 03/21/2022   ALT 17 03/21/2022   AST 19 03/21/2022   NA 139 03/21/2022   K 4.0 03/21/2022   CL 105 03/21/2022   CREATININE 0.82 03/21/2022   BUN 13 03/21/2022   CO2 28 03/21/2022   TSH 2.12 03/21/2022     Assessment & Plan:    See Problem List for Assessment and Plan of chronic medical problems.   This encounter was created in  error - please disregard.

## 2024-05-17 ENCOUNTER — Encounter: Payer: 59 | Admitting: Internal Medicine

## 2024-05-17 DIAGNOSIS — M62830 Muscle spasm of back: Secondary | ICD-10-CM

## 2024-05-17 DIAGNOSIS — F411 Generalized anxiety disorder: Secondary | ICD-10-CM

## 2024-05-17 DIAGNOSIS — F902 Attention-deficit hyperactivity disorder, combined type: Secondary | ICD-10-CM

## 2024-05-17 DIAGNOSIS — F3289 Other specified depressive episodes: Secondary | ICD-10-CM

## 2024-05-17 NOTE — Progress Notes (Deleted)
  [name] [address] [place] [address] Phone: [phone] Subjective:    I'm seeing this patient by the request  of:  Geofm Glade PARAS, MD  CC: Back and neck pain  YEP:[name]  Erica [name] is a 43 y.o. female coming in with complaint of back and neck pain. OMT 02/28/2024.  Back pain is usually left-sided.  Patient states   Medications patient has been prescribed: None  Taking:         Reviewed prior external information including notes and imaging from previsou exam, outside providers and external EMR if available.   As well as notes that were available from care everywhere and other healthcare systems.  Past medical history, social, surgical and family history all reviewed in electronic medical record.  No pertanent information unless stated regarding to the chief complaint.   Past Medical History:  Diagnosis Date   Allergy    Anxiety    Depression    STD (sexually transmitted disease)    chlamydia treated 2023    No Known Allergies   Review of Systems:  No headache, visual changes, nausea, vomiting, diarrhea, constipation, dizziness, abdominal pain, skin rash, fevers, chills, night sweats, weight loss, swollen lymph nodes, body aches, joint swelling, chest pain, shortness of breath, mood changes. POSITIVE muscle aches  Objective  There were no vitals taken for this visit.   General: No apparent distress alert and oriented x3 mood and affect normal, dressed appropriately.  HEENT: Pupils equal, extraocular movements intact  Respiratory: Patient's speak in full sentences and does not appear short of breath  Cardiovascular: No lower extremity edema, non tender, no erythema  Gait MSK:  Back   Osteopathic findings  C2 flexed rotated and side bent right C6 flexed rotated and side bent left T3 extended rotated and side bent right inhaled rib T9 extended rotated and side bent left L2 flexed rotated and side bent  right Sacrum right on right       Assessment and Plan:  No problem-specific Assessment & Plan notes found for this encounter.    Nonallopathic problems  Decision today to treat with OMT was based on Physical Exam  After verbal consent patient was treated with HVLA, ME, FPR techniques in cervical, rib, thoracic, lumbar, and sacral  areas  Patient tolerated the procedure well with improvement in symptoms  Patient given exercises, stretches and lifestyle modifications  See medications in patient instructions if given  Patient will follow up in 4-8 weeks     The above documentation has been reviewed and is accurate and complete Leighla Chestnutt M Fredda Clarida, DO         Note: This dictation was prepared with Dragon dictation along with smaller phrase technology. Any transcriptional errors that result from this process are unintentional.

## 2024-05-20 ENCOUNTER — Ambulatory Visit: Admitting: Family Medicine

## 2024-05-20 ENCOUNTER — Other Ambulatory Visit: Payer: Self-pay | Admitting: Internal Medicine

## 2024-05-22 ENCOUNTER — Ambulatory Visit (INDEPENDENT_AMBULATORY_CARE_PROVIDER_SITE_OTHER): Payer: PRIVATE HEALTH INSURANCE | Admitting: Psychology

## 2024-05-22 DIAGNOSIS — F900 Attention-deficit hyperactivity disorder, predominantly inattentive type: Secondary | ICD-10-CM

## 2024-05-22 DIAGNOSIS — F331 Major depressive disorder, recurrent, moderate: Secondary | ICD-10-CM

## 2024-05-22 DIAGNOSIS — F411 Generalized anxiety disorder: Secondary | ICD-10-CM | POA: Diagnosis not present
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
## 2024-06-05 ENCOUNTER — Ambulatory Visit (INDEPENDENT_AMBULATORY_CARE_PROVIDER_SITE_OTHER): Payer: PRIVATE HEALTH INSURANCE | Admitting: Psychology

## 2024-06-05 DIAGNOSIS — F331 Major depressive disorder, recurrent, moderate: Secondary | ICD-10-CM | POA: Diagnosis not present

## 2024-06-05 DIAGNOSIS — F411 Generalized anxiety disorder: Secondary | ICD-10-CM | POA: Diagnosis not present

## 2024-06-05 DIAGNOSIS — F900 Attention-deficit hyperactivity disorder, predominantly inattentive type: Secondary | ICD-10-CM | POA: Diagnosis not present
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
## 2024-06-11 NOTE — Progress Notes (Deleted)
  [name] [address] [place] [address] Phone: [phone] Subjective:    I'm seeing this patient by the request  of:  Geofm Glade PARAS, MD  CC:   YEP:[name]  Erica [name] is a 43 y.o. female coming in with complaint of back and neck pain. OMT on 02/28/2024. Patient states   Medications patient has been prescribed:   Taking:         Reviewed prior external information including notes and imaging from previsou exam, outside providers and external EMR if available.   As well as notes that were available from care everywhere and other healthcare systems.  Past medical history, social, surgical and family history all reviewed in electronic medical record.  No pertanent information unless stated regarding to the chief complaint.   Past Medical History:  Diagnosis Date   Allergy    Anxiety    Depression    STD (sexually transmitted disease)    chlamydia treated 2023    No Known Allergies   Review of Systems:  No headache, visual changes, nausea, vomiting, diarrhea, constipation, dizziness, abdominal pain, skin rash, fevers, chills, night sweats, weight loss, swollen lymph nodes, body aches, joint swelling, chest pain, shortness of breath, mood changes. POSITIVE muscle aches  Objective  There were no vitals taken for this visit.   General: No apparent distress alert and oriented x3 mood and affect normal, dressed appropriately.  HEENT: Pupils equal, extraocular movements intact  Respiratory: Patient's speak in full sentences and does not appear short of breath  Cardiovascular: No lower extremity edema, non tender, no erythema  Gait MSK:  Back   Osteopathic findings  C2 flexed rotated and side bent right C6 flexed rotated and side bent left T3 extended rotated and side bent right inhaled rib T9 extended rotated and side bent left L2 flexed rotated and side bent right Sacrum right on right       Assessment and  Plan:  No problem-specific Assessment & Plan notes found for this encounter.    Nonallopathic problems  Decision today to treat with OMT was based on Physical Exam  After verbal consent patient was treated with HVLA, ME, FPR techniques in cervical, rib, thoracic, lumbar, and sacral  areas  Patient tolerated the procedure well with improvement in symptoms  Patient given exercises, stretches and lifestyle modifications  See medications in patient instructions if given  Patient will follow up in 4-8 weeks             Note: This dictation was prepared with Dragon dictation along with smaller phrase technology. Any transcriptional errors that result from this process are unintentional.

## 2024-06-13 ENCOUNTER — Ambulatory Visit: Admitting: Family Medicine

## 2024-06-13 NOTE — Progress Notes (Unsigned)
 [name] [address] [place] [address] Phone: [phone] Subjective:   LILLETTE Erica [name], am serving as a scribe for Dr. Arthea Claudene.  I'm seeing this patient by the request  of:  Geofm Glade PARAS, MD  CC: Neck and back pain  YEP:[name]  Erica [name] is a 43 y.o. female coming in with complaint of back and neck pain. OMT on 02/28/2024. Patient states that her back is doing ok.   B knee pain R>L. Uses massage gun on ITB which is helpful. Painful to extend knee and bear weight.   Medications patient has been prescribed:   Taking:         Reviewed prior external information including notes and imaging from previsou exam, outside providers and external EMR if available.   As well as notes that were available from care everywhere and other healthcare systems.  Past medical history, social, surgical and family history all reviewed in electronic medical record.  No pertanent information unless stated regarding to the chief complaint.   Past Medical History:  Diagnosis Date   Allergy    Anxiety    Depression    STD (sexually transmitted disease)    chlamydia treated 2023    No Known Allergies   Review of Systems:  No headache, visual changes, nausea, vomiting, diarrhea, constipation, dizziness, abdominal pain, skin rash, fevers, chills, night sweats, weight loss, swollen lymph nodes, body aches, joint swelling, chest pain, shortness of breath, mood changes. POSITIVE muscle aches  Objective  Blood pressure 124/80, pulse 82, height 5' 7 (1.702 m), weight 198 lb (89.8 kg), SpO2 98%.   General: No apparent distress alert and oriented x3 mood and affect normal, dressed appropriately.  HEENT: Pupils equal, extraocular movements intact  Respiratory: Patient's speak in full sentences and does not appear short of breath  Cardiovascular: No lower extremity edema, non tender, no erythema  Gait relatively normal. MSK:  Back  significant tightness noted mostly on the left side of the sacroiliac joint.  Left hip does have some pain as well.  Back exam does have some limited sidebending but negative Spurling's. Bilateral knees do have some mild crepitus noted.  Does have some patellofemoral lateral tracking noted.  Trace effusion noted of the left side. Osteopathic findings  C2 flexed rotated and side bent right C6 flexed rotated and side bent left T3 extended rotated and side bent right inhaled rib T9 extended rotated and side bent left L2 flexed rotated and side bent right L4 flexed rotated and side bent left Sacrum left on left     Assessment and Plan:  Cervical radiculopathy at C8 Stable overall.  Discussed icing regimen and home exercises, continue to work on core strengthening.  Discussed icing regimen.  Follow-up with me again in 6 to 8 weeks otherwise.  Patellofemoral syndrome of left knee Known patellofemoral.  We have discussed with patient that I do think the contralateral knee he likely has some lateral tracking as well.  Will give VMO strengthening exercises.  Continue to work on hip abductor strengthening.  Discussed icing regimen.  Follow-up again in 6 to 8 weeks Due to this pain injection  Nonallopathic problems  Decision today to treat with OMT was based on Physical Exam  After verbal consent patient was treated with HVLA, ME, FPR techniques in cervical, rib, thoracic, lumbar, and sacral  areas  Patient tolerated the procedure well with improvement in symptoms  Patient given exercises, stretches and lifestyle modifications  See medications in patient instructions if given  Patient will follow up in 4-8 weeks     The above documentation has been reviewed and is accurate and complete Chrislynn Mosely M Rajeev Escue, DO         Note: This dictation was prepared with Dragon dictation along with smaller phrase technology. Any transcriptional errors that result from this process are unintentional.

## 2024-06-14 ENCOUNTER — Ambulatory Visit: Admitting: Family Medicine

## 2024-06-14 ENCOUNTER — Ambulatory Visit (INDEPENDENT_AMBULATORY_CARE_PROVIDER_SITE_OTHER)

## 2024-06-14 ENCOUNTER — Other Ambulatory Visit: Payer: Self-pay | Admitting: Internal Medicine

## 2024-06-14 ENCOUNTER — Encounter: Payer: Self-pay | Admitting: Family Medicine

## 2024-06-14 VITALS — BP 124/80 | HR 82 | Ht 67.0 in | Wt 198.0 lb

## 2024-06-14 DIAGNOSIS — M9902 Segmental and somatic dysfunction of thoracic region: Secondary | ICD-10-CM

## 2024-06-14 DIAGNOSIS — M25561 Pain in right knee: Secondary | ICD-10-CM

## 2024-06-14 DIAGNOSIS — M9901 Segmental and somatic dysfunction of cervical region: Secondary | ICD-10-CM

## 2024-06-14 DIAGNOSIS — M9903 Segmental and somatic dysfunction of lumbar region: Secondary | ICD-10-CM

## 2024-06-14 DIAGNOSIS — M5412 Radiculopathy, cervical region: Secondary | ICD-10-CM

## 2024-06-14 DIAGNOSIS — M222X2 Patellofemoral disorders, left knee: Secondary | ICD-10-CM

## 2024-06-14 DIAGNOSIS — G8929 Other chronic pain: Secondary | ICD-10-CM | POA: Diagnosis not present

## 2024-06-14 DIAGNOSIS — M9904 Segmental and somatic dysfunction of sacral region: Secondary | ICD-10-CM

## 2024-06-14 DIAGNOSIS — M9908 Segmental and somatic dysfunction of rib cage: Secondary | ICD-10-CM

## 2024-06-14 MED ORDER — METHYLPREDNISOLONE ACETATE 80 MG/ML IJ SUSP
80.0000 mg | Freq: Once | INTRAMUSCULAR | Status: AC
  Administered 2024-06-14: 80 mg via INTRAMUSCULAR

## 2024-06-14 MED ORDER — KETOROLAC TROMETHAMINE 60 MG/2ML IM SOLN
60.0000 mg | Freq: Once | INTRAMUSCULAR | Status: AC
  Administered 2024-06-14: 60 mg via INTRAMUSCULAR

## 2024-06-14 NOTE — Assessment & Plan Note (Signed)
 Known patellofemoral.  We have discussed with patient that I do think the contralateral knee he likely has some lateral tracking as well.  Will give VMO strengthening exercises.  Continue to work on hip abductor strengthening.  Discussed icing regimen.  Follow-up again in 6 to 8 weeks

## 2024-06-14 NOTE — Patient Instructions (Addendum)
 Injections in backside See me again in 8 weeks

## 2024-06-14 NOTE — Assessment & Plan Note (Signed)
 Stable overall.  Discussed icing regimen and home exercises, continue to work on core strengthening.  Discussed icing regimen.  Follow-up with me again in 6 to 8 weeks otherwise.

## 2024-06-18 ENCOUNTER — Other Ambulatory Visit: Payer: Self-pay | Admitting: Internal Medicine

## 2024-06-19 ENCOUNTER — Ambulatory Visit (INDEPENDENT_AMBULATORY_CARE_PROVIDER_SITE_OTHER): Payer: PRIVATE HEALTH INSURANCE | Admitting: Psychology

## 2024-06-19 DIAGNOSIS — F411 Generalized anxiety disorder: Secondary | ICD-10-CM | POA: Diagnosis not present

## 2024-06-19 DIAGNOSIS — F900 Attention-deficit hyperactivity disorder, predominantly inattentive type: Secondary | ICD-10-CM

## 2024-06-19 DIAGNOSIS — F331 Major depressive disorder, recurrent, moderate: Secondary | ICD-10-CM

## 2024-06-19 DIAGNOSIS — F330 Major depressive disorder, recurrent, mild: Secondary | ICD-10-CM
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
## 2024-06-23 ENCOUNTER — Ambulatory Visit: Payer: Self-pay | Admitting: Family Medicine

## 2024-06-24 ENCOUNTER — Encounter: Payer: Self-pay | Admitting: Internal Medicine

## 2024-06-24 ENCOUNTER — Ambulatory Visit: Payer: PRIVATE HEALTH INSURANCE | Admitting: Psychology

## 2024-06-24 DIAGNOSIS — F330 Major depressive disorder, recurrent, mild: Secondary | ICD-10-CM

## 2024-06-24 DIAGNOSIS — F411 Generalized anxiety disorder: Secondary | ICD-10-CM

## 2024-06-24 DIAGNOSIS — F900 Attention-deficit hyperactivity disorder, predominantly inattentive type: Secondary | ICD-10-CM

## 2024-06-24 DIAGNOSIS — F331 Major depressive disorder, recurrent, moderate: Secondary | ICD-10-CM | POA: Diagnosis not present

## 2024-06-24 NOTE — Progress Notes (Unsigned)
      Subjective:    Patient ID: Erica [name], female    DOB: 1981/06/18, 42 y.o.   MRN: [id number]     HPI Erica [name] is here for follow up of her chronic medical problems.  Right knee pain - seeing Dr Claudene.  Has patellofemoral syndrome.  Has exercising.    Medications and allergies reviewed with patient and updated if appropriate.  Current Outpatient Medications on File Prior to Visit  Medication Sig Dispense Refill   ALPRAZolam  (XANAX ) 0.5 MG tablet TAKE 1 TABLET (0.5 MG TOTAL) BY MOUTH 3 (THREE) TIMES DAILY AS NEEDED FOR ANXIETY. 45 tablet 0   atomoxetine  (STRATTERA ) 80 MG capsule TAKE 1 CAPSULE BY MOUTH EVERY DAY 90 capsule 1   cyclobenzaprine  (FLEXERIL ) 10 MG tablet TAKE 1 TABLET BY MOUTH THREE TIMES DAILY AS NEEDED FOR MUSCLE SPASMS 30 tablet 3   DULoxetine  (CYMBALTA ) 60 MG capsule TAKE 1 CAPSULE BY MOUTH EVERY DAY 90 capsule 0   meloxicam  (MOBIC ) 15 MG tablet TAKE 1 TABLET BY MOUTH EVERY DAY 30 tablet 1   No current facility-administered medications on file prior to visit.     Review of Systems     Objective:  There were no vitals filed for this visit. BP Readings from Last 3 Encounters:  06/14/24 124/80  02/28/24 124/76  01/15/24 124/78   Wt Readings from Last 3 Encounters:  06/14/24 198 lb (89.8 kg)  02/28/24 194 lb (88 kg)  01/15/24 195 lb (88.5 kg)   There is no height or weight on file to calculate BMI.    Physical Exam     Lab Results  Component Value Date   WBC 6.6 03/21/2022   HGB 13.3 03/21/2022   HCT 40.0 03/21/2022   PLT 268.0 03/21/2022   GLUCOSE 97 03/21/2022   CHOL 215 (H) 03/21/2022   TRIG 127.0 03/21/2022   HDL 43.20 03/21/2022   LDLDIRECT 154.6 01/25/2013   LDLCALC 147 (H) 03/21/2022   ALT 17 03/21/2022   AST 19 03/21/2022   NA 139 03/21/2022   K 4.0 03/21/2022   CL 105 03/21/2022   CREATININE 0.82 03/21/2022   BUN 13 03/21/2022   CO2 28 03/21/2022   TSH 2.12 03/21/2022     Assessment & Plan:    See Problem List for  Assessment and Plan of chronic medical problems.
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
## 2024-06-25 ENCOUNTER — Ambulatory Visit (INDEPENDENT_AMBULATORY_CARE_PROVIDER_SITE_OTHER): Admitting: Internal Medicine

## 2024-06-25 VITALS — BP 126/70 | HR 80 | Temp 98.0°F | Ht 67.0 in | Wt 197.0 lb

## 2024-06-25 DIAGNOSIS — Z23 Encounter for immunization: Secondary | ICD-10-CM

## 2024-06-25 DIAGNOSIS — M62830 Muscle spasm of back: Secondary | ICD-10-CM | POA: Diagnosis not present

## 2024-06-25 DIAGNOSIS — F411 Generalized anxiety disorder: Secondary | ICD-10-CM | POA: Diagnosis not present

## 2024-06-25 DIAGNOSIS — F902 Attention-deficit hyperactivity disorder, combined type: Secondary | ICD-10-CM

## 2024-06-25 DIAGNOSIS — F3289 Other specified depressive episodes: Secondary | ICD-10-CM | POA: Diagnosis not present

## 2024-06-25 DIAGNOSIS — J3489 Other specified disorders of nose and nasal sinuses: Secondary | ICD-10-CM

## 2024-06-25 NOTE — Assessment & Plan Note (Signed)
Chronic Intermittent Continue meloxicam 15 mg daily  Continue Flexeril 10 mg 3 times daily as needed for spasms - not taking often

## 2024-06-25 NOTE — Assessment & Plan Note (Addendum)
 Chronic Controlled, Stable Sees her therapist regularly Continue duloxetine  60 mg daily, alprazolam  0.5 mg 3 times daily as needed

## 2024-06-25 NOTE — Assessment & Plan Note (Signed)
 New Feels that 1 side of her nasal passageways is obstructed partially-does not always feel like she gets a good breath through both nostrils when she breathes in Has been using saline spray helps a little Can also try Flonase in addition to saline spray If it is not improving can refer to ENT for further evaluation-possible deviated septum versus chronic congestion or less likely nasal polyp

## 2024-06-25 NOTE — Patient Instructions (Addendum)
    Flu immunization administered today.     Medications changes include :   None    [facility] [address] [place], [place] [address] Phone: [phone]    Return in about 6 months (around 12/23/2024) for follow up.

## 2024-06-25 NOTE — Assessment & Plan Note (Signed)
Chronic Controlled, Stable Continue duloxetine 60 mg daily 

## 2024-06-25 NOTE — Assessment & Plan Note (Signed)
 Chronic Diagnosed by her therapist Controlled Continue atomoxetine  80 mg daily

## 2024-07-03 ENCOUNTER — Ambulatory Visit: Payer: PRIVATE HEALTH INSURANCE | Admitting: Psychology

## 2024-07-03 DIAGNOSIS — F331 Major depressive disorder, recurrent, moderate: Secondary | ICD-10-CM

## 2024-07-03 DIAGNOSIS — F411 Generalized anxiety disorder: Secondary | ICD-10-CM

## 2024-07-03 DIAGNOSIS — F900 Attention-deficit hyperactivity disorder, predominantly inattentive type: Secondary | ICD-10-CM | POA: Diagnosis not present
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
## 2024-07-13 ENCOUNTER — Other Ambulatory Visit: Payer: Self-pay | Admitting: Internal Medicine

## 2024-07-17 ENCOUNTER — Ambulatory Visit (INDEPENDENT_AMBULATORY_CARE_PROVIDER_SITE_OTHER): Payer: PRIVATE HEALTH INSURANCE | Admitting: Psychology

## 2024-07-17 DIAGNOSIS — F330 Major depressive disorder, recurrent, mild: Secondary | ICD-10-CM

## 2024-07-17 DIAGNOSIS — F900 Attention-deficit hyperactivity disorder, predominantly inattentive type: Secondary | ICD-10-CM | POA: Diagnosis not present

## 2024-07-17 DIAGNOSIS — F411 Generalized anxiety disorder: Secondary | ICD-10-CM | POA: Diagnosis not present
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
## 2024-07-29 ENCOUNTER — Ambulatory Visit (INDEPENDENT_AMBULATORY_CARE_PROVIDER_SITE_OTHER): Payer: PRIVATE HEALTH INSURANCE | Admitting: Psychology

## 2024-07-29 DIAGNOSIS — F330 Major depressive disorder, recurrent, mild: Secondary | ICD-10-CM

## 2024-07-29 DIAGNOSIS — F900 Attention-deficit hyperactivity disorder, predominantly inattentive type: Secondary | ICD-10-CM | POA: Diagnosis not present

## 2024-07-29 DIAGNOSIS — F411 Generalized anxiety disorder: Secondary | ICD-10-CM | POA: Diagnosis not present
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
## 2024-07-31 ENCOUNTER — Ambulatory Visit: Payer: PRIVATE HEALTH INSURANCE | Admitting: Psychology

## 2024-08-12 ENCOUNTER — Telehealth: Payer: Self-pay

## 2024-08-12 NOTE — Telephone Encounter (Signed)
 Copied from CRM #[id number]. Topic: Referral - Request for Referral >> Aug 12, 2024  1:48 PM Rea C wrote: Did the patient discuss referral with their provider in the last year? Yes (If No - schedule appointment) (If Yes - send message)  Appointment offered? No  Type of order/referral and detailed reason for visit: Gynecologist, would prefer female provider  Preference of office, provider, location: Accepts medicaid, [place] around [place]   If referral order, have you been seen by this specialty before? Yes (If Yes, this issue or another issue? When? Where?  Can we respond through MyChart? Yes

## 2024-08-15 NOTE — Progress Notes (Signed)
  [name] [address] [place] [address] Phone: [phone] Subjective:   Erica [name], am serving as a scribe for Dr. Arthea Claudene.  I'm seeing this patient by the request  of:  Geofm Glade PARAS, MD  CC: Back and neck pain follow-up right knee pain follow-up  YEP:[name]  Erica [name] is a 43 y.o. female coming in with complaint of back and neck pain. OMT 06/14/2024. Also f/u for R knee pain. Patient states same per usual. Knee is doing better. NO new symptoms.  Medications patient has been prescribed: None  Taking:         Reviewed prior external information including notes and imaging from previsou exam, outside providers and external EMR if available.   As well as notes that were available from care everywhere and other healthcare systems.  Past medical history, social, surgical and family history all reviewed in electronic medical record.  No pertanent information unless stated regarding to the chief complaint.   Past Medical History:  Diagnosis Date   Allergy    Anxiety    Depression    STD (sexually transmitted disease)    chlamydia treated 2023    No Known Allergies   Review of Systems:  No headache, visual changes, nausea, vomiting, diarrhea, constipation, dizziness, abdominal pain, skin rash, fevers, chills, night sweats, weight loss, swollen lymph nodes, body aches, joint swelling, chest pain, shortness of breath, mood changes. POSITIVE muscle aches  Objective  Blood pressure (!) 124/90, height 5' 7 (1.702 m), weight 199 lb (90.3 kg).   General: No apparent distress alert and oriented x3 mood and affect normal, dressed appropriately.  HEENT: Pupils equal, extraocular movements intact  Respiratory: Patient's speak in full sentences and does not appear short of breath  Cardiovascular: No lower extremity edema, non tender, no erythema  Gait MSK:  Back does have some mild loss of lordosis noted.  Tightness  noted still of the left hip.  Some decrease in internal range of motion of the left hip.  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  Continuing to work on designer, fashion/clothing.  Has done relatively well in core strength is still encouraged to improve.  Discussed icing regimen and home exercises otherwise.  Follow-up again in 6 to 8 weeks    Nonallopathic problems  Decision today to treat with OMT was based on Physical Exam  After verbal consent patient was treated with HVLA, ME, FPR techniques in cervical, rib, thoracic, lumbar, and sacral  areas  Patient tolerated the procedure well with improvement in symptoms  Patient given exercises, stretches and lifestyle modifications  See medications in patient instructions if given  Patient will follow up in 4-8 weeks     The above documentation has been reviewed and is accurate and complete Travontae Freiberger M Clara Herbison, DO         Note: This dictation was prepared with Dragon dictation along with smaller phrase technology. Any transcriptional errors that result from this process are unintentional.

## 2024-08-20 ENCOUNTER — Encounter: Payer: Self-pay | Admitting: Family Medicine

## 2024-08-20 ENCOUNTER — Ambulatory Visit: Admitting: Family Medicine

## 2024-08-20 VITALS — BP 124/90 | Ht 67.0 in | Wt 199.0 lb

## 2024-08-20 DIAGNOSIS — M9908 Segmental and somatic dysfunction of rib cage: Secondary | ICD-10-CM

## 2024-08-20 DIAGNOSIS — M9904 Segmental and somatic dysfunction of sacral region: Secondary | ICD-10-CM | POA: Diagnosis not present

## 2024-08-20 DIAGNOSIS — M9903 Segmental and somatic dysfunction of lumbar region: Secondary | ICD-10-CM | POA: Diagnosis not present

## 2024-08-20 DIAGNOSIS — M25552 Pain in left hip: Secondary | ICD-10-CM | POA: Diagnosis not present

## 2024-08-20 DIAGNOSIS — M9901 Segmental and somatic dysfunction of cervical region: Secondary | ICD-10-CM | POA: Diagnosis not present

## 2024-08-20 DIAGNOSIS — M9902 Segmental and somatic dysfunction of thoracic region: Secondary | ICD-10-CM

## 2024-08-20 NOTE — Assessment & Plan Note (Signed)
 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  Continuing to work on designer, fashion/clothing.  Has done relatively well in core strength is still encouraged to improve.  Discussed icing regimen and home exercises otherwise.  Follow-up again in 6 to 8 weeks

## 2024-08-28 ENCOUNTER — Ambulatory Visit: Payer: PRIVATE HEALTH INSURANCE | Admitting: Psychology

## 2024-08-28 DIAGNOSIS — F330 Major depressive disorder, recurrent, mild: Secondary | ICD-10-CM
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
## 2024-09-11 ENCOUNTER — Ambulatory Visit: Payer: PRIVATE HEALTH INSURANCE | Admitting: Psychology

## 2024-09-11 DIAGNOSIS — F411 Generalized anxiety disorder: Secondary | ICD-10-CM
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
## 2024-09-12 ENCOUNTER — Other Ambulatory Visit: Payer: Self-pay | Admitting: Internal Medicine

## 2024-09-13 ENCOUNTER — Other Ambulatory Visit: Payer: Self-pay | Admitting: Internal Medicine

## 2024-09-20 ENCOUNTER — Other Ambulatory Visit: Payer: Self-pay | Admitting: Internal Medicine

## 2024-09-25 ENCOUNTER — Ambulatory Visit: Payer: PRIVATE HEALTH INSURANCE | Admitting: Psychology

## 2024-09-25 DIAGNOSIS — F411 Generalized anxiety disorder: Secondary | ICD-10-CM

## 2024-09-25 DIAGNOSIS — F330 Major depressive disorder, recurrent, mild: Secondary | ICD-10-CM

## 2024-09-25 DIAGNOSIS — F900 Attention-deficit hyperactivity disorder, predominantly inattentive type: Secondary | ICD-10-CM
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
## 2024-10-09 ENCOUNTER — Ambulatory Visit: Payer: PRIVATE HEALTH INSURANCE | Admitting: Psychology

## 2024-10-09 DIAGNOSIS — F411 Generalized anxiety disorder: Secondary | ICD-10-CM | POA: Diagnosis not present
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
## 2024-10-23 ENCOUNTER — Ambulatory Visit: Payer: PRIVATE HEALTH INSURANCE | Admitting: Psychology

## 2024-10-23 DIAGNOSIS — F411 Generalized anxiety disorder: Secondary | ICD-10-CM | POA: Diagnosis not present
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
## 2024-11-06 ENCOUNTER — Ambulatory Visit: Admitting: Psychology

## 2024-11-15 ENCOUNTER — Ambulatory Visit: Admitting: Family Medicine

## 2024-11-20 ENCOUNTER — Ambulatory Visit: Admitting: Psychology

## 2024-12-04 ENCOUNTER — Ambulatory Visit: Admitting: Psychology

## 2024-12-18 ENCOUNTER — Ambulatory Visit: Admitting: Psychology

## 2024-12-24 ENCOUNTER — Ambulatory Visit: Admitting: Internal Medicine

## 2025-01-01 ENCOUNTER — Ambulatory Visit: Admitting: Psychology

## 2025-01-15 ENCOUNTER — Ambulatory Visit: Admitting: Psychology
